# Patient Record
Sex: Male | Born: 1955 | Race: White | Hispanic: No | Marital: Married | Smoking: Never smoker
Health system: Southern US, Community
[De-identification: ages and names within clinical notes are randomized; demographics above are authoritative.]

## PROBLEM LIST (undated history)

## (undated) DIAGNOSIS — I509 Heart failure, unspecified: Secondary | ICD-10-CM

## (undated) DIAGNOSIS — E119 Type 2 diabetes mellitus without complications: Secondary | ICD-10-CM

## (undated) DIAGNOSIS — I10 Essential (primary) hypertension: Secondary | ICD-10-CM

## (undated) DIAGNOSIS — R0600 Dyspnea, unspecified: Secondary | ICD-10-CM

## (undated) HISTORY — PX: WISDOM TOOTH EXTRACTION: SHX21

---

## 2017-06-05 DIAGNOSIS — J209 Acute bronchitis, unspecified: Secondary | ICD-10-CM | POA: Diagnosis not present

## 2017-09-07 DIAGNOSIS — R05 Cough: Secondary | ICD-10-CM | POA: Diagnosis not present

## 2017-09-07 DIAGNOSIS — J069 Acute upper respiratory infection, unspecified: Secondary | ICD-10-CM | POA: Diagnosis not present

## 2017-09-07 DIAGNOSIS — R062 Wheezing: Secondary | ICD-10-CM | POA: Diagnosis not present

## 2018-07-20 DIAGNOSIS — Z23 Encounter for immunization: Secondary | ICD-10-CM | POA: Diagnosis not present

## 2018-10-20 DIAGNOSIS — Z6828 Body mass index (BMI) 28.0-28.9, adult: Secondary | ICD-10-CM | POA: Diagnosis not present

## 2018-10-20 DIAGNOSIS — Z13220 Encounter for screening for lipoid disorders: Secondary | ICD-10-CM | POA: Diagnosis not present

## 2018-10-20 DIAGNOSIS — Z125 Encounter for screening for malignant neoplasm of prostate: Secondary | ICD-10-CM | POA: Diagnosis not present

## 2018-10-20 DIAGNOSIS — Z1211 Encounter for screening for malignant neoplasm of colon: Secondary | ICD-10-CM | POA: Diagnosis not present

## 2018-10-20 DIAGNOSIS — Z1331 Encounter for screening for depression: Secondary | ICD-10-CM | POA: Diagnosis not present

## 2018-10-20 DIAGNOSIS — Z0001 Encounter for general adult medical examination with abnormal findings: Secondary | ICD-10-CM | POA: Diagnosis not present

## 2018-10-20 DIAGNOSIS — Z131 Encounter for screening for diabetes mellitus: Secondary | ICD-10-CM | POA: Diagnosis not present

## 2018-10-20 DIAGNOSIS — I10 Essential (primary) hypertension: Secondary | ICD-10-CM | POA: Diagnosis not present

## 2018-11-14 DIAGNOSIS — E7800 Pure hypercholesterolemia, unspecified: Secondary | ICD-10-CM | POA: Diagnosis not present

## 2018-11-14 DIAGNOSIS — I10 Essential (primary) hypertension: Secondary | ICD-10-CM | POA: Diagnosis not present

## 2018-11-14 DIAGNOSIS — Z6829 Body mass index (BMI) 29.0-29.9, adult: Secondary | ICD-10-CM | POA: Diagnosis not present

## 2018-11-14 DIAGNOSIS — E1165 Type 2 diabetes mellitus with hyperglycemia: Secondary | ICD-10-CM | POA: Diagnosis not present

## 2019-01-10 DIAGNOSIS — E1165 Type 2 diabetes mellitus with hyperglycemia: Secondary | ICD-10-CM | POA: Diagnosis not present

## 2019-01-10 DIAGNOSIS — E785 Hyperlipidemia, unspecified: Secondary | ICD-10-CM | POA: Diagnosis not present

## 2019-01-15 DIAGNOSIS — J309 Allergic rhinitis, unspecified: Secondary | ICD-10-CM | POA: Diagnosis not present

## 2019-01-15 DIAGNOSIS — I10 Essential (primary) hypertension: Secondary | ICD-10-CM | POA: Diagnosis not present

## 2019-01-15 DIAGNOSIS — E7800 Pure hypercholesterolemia, unspecified: Secondary | ICD-10-CM | POA: Diagnosis not present

## 2019-01-15 DIAGNOSIS — E1165 Type 2 diabetes mellitus with hyperglycemia: Secondary | ICD-10-CM | POA: Diagnosis not present

## 2019-01-19 DIAGNOSIS — E1165 Type 2 diabetes mellitus with hyperglycemia: Secondary | ICD-10-CM | POA: Diagnosis not present

## 2019-02-16 DIAGNOSIS — J0100 Acute maxillary sinusitis, unspecified: Secondary | ICD-10-CM | POA: Diagnosis not present

## 2019-02-16 DIAGNOSIS — J205 Acute bronchitis due to respiratory syncytial virus: Secondary | ICD-10-CM | POA: Diagnosis not present

## 2019-04-17 DIAGNOSIS — E1165 Type 2 diabetes mellitus with hyperglycemia: Secondary | ICD-10-CM | POA: Diagnosis not present

## 2019-04-19 DIAGNOSIS — E785 Hyperlipidemia, unspecified: Secondary | ICD-10-CM | POA: Diagnosis not present

## 2019-04-19 DIAGNOSIS — E1169 Type 2 diabetes mellitus with other specified complication: Secondary | ICD-10-CM | POA: Diagnosis not present

## 2019-04-19 DIAGNOSIS — E7800 Pure hypercholesterolemia, unspecified: Secondary | ICD-10-CM | POA: Diagnosis not present

## 2019-04-19 DIAGNOSIS — I10 Essential (primary) hypertension: Secondary | ICD-10-CM | POA: Diagnosis not present

## 2019-04-24 DIAGNOSIS — U071 COVID-19: Secondary | ICD-10-CM | POA: Diagnosis not present

## 2019-04-25 DIAGNOSIS — U071 COVID-19: Secondary | ICD-10-CM | POA: Diagnosis not present

## 2019-04-27 DIAGNOSIS — U071 COVID-19: Secondary | ICD-10-CM | POA: Diagnosis not present

## 2019-07-05 DIAGNOSIS — Z23 Encounter for immunization: Secondary | ICD-10-CM | POA: Diagnosis not present

## 2019-07-30 DIAGNOSIS — Z6825 Body mass index (BMI) 25.0-25.9, adult: Secondary | ICD-10-CM | POA: Diagnosis not present

## 2019-07-30 DIAGNOSIS — Z20828 Contact with and (suspected) exposure to other viral communicable diseases: Secondary | ICD-10-CM | POA: Diagnosis not present

## 2019-07-30 DIAGNOSIS — J22 Unspecified acute lower respiratory infection: Secondary | ICD-10-CM | POA: Diagnosis not present

## 2019-07-30 DIAGNOSIS — I10 Essential (primary) hypertension: Secondary | ICD-10-CM | POA: Diagnosis not present

## 2019-08-28 DIAGNOSIS — Z6825 Body mass index (BMI) 25.0-25.9, adult: Secondary | ICD-10-CM | POA: Diagnosis not present

## 2019-08-28 DIAGNOSIS — J40 Bronchitis, not specified as acute or chronic: Secondary | ICD-10-CM | POA: Diagnosis not present

## 2019-08-28 DIAGNOSIS — E1169 Type 2 diabetes mellitus with other specified complication: Secondary | ICD-10-CM | POA: Diagnosis not present

## 2019-08-28 DIAGNOSIS — E785 Hyperlipidemia, unspecified: Secondary | ICD-10-CM | POA: Diagnosis not present

## 2019-09-12 ENCOUNTER — Other Ambulatory Visit: Payer: Self-pay | Admitting: Family Medicine

## 2019-09-13 ENCOUNTER — Other Ambulatory Visit: Payer: Self-pay | Admitting: Family Medicine

## 2019-09-13 DIAGNOSIS — N183 Chronic kidney disease, stage 3 unspecified: Secondary | ICD-10-CM

## 2019-09-13 DIAGNOSIS — N289 Disorder of kidney and ureter, unspecified: Secondary | ICD-10-CM

## 2019-09-20 ENCOUNTER — Ambulatory Visit
Admission: RE | Admit: 2019-09-20 | Discharge: 2019-09-20 | Disposition: A | Discharge status: Home / Self Care | Payer: BC Managed Care – PPO | Source: Ambulatory Visit | Attending: Family Medicine | Admitting: Family Medicine

## 2019-09-20 DIAGNOSIS — N183 Chronic kidney disease, stage 3 unspecified: Secondary | ICD-10-CM

## 2019-09-20 DIAGNOSIS — N289 Disorder of kidney and ureter, unspecified: Secondary | ICD-10-CM

## 2019-11-25 DIAGNOSIS — J209 Acute bronchitis, unspecified: Secondary | ICD-10-CM | POA: Diagnosis not present

## 2019-11-25 DIAGNOSIS — J309 Allergic rhinitis, unspecified: Secondary | ICD-10-CM | POA: Diagnosis not present

## 2020-02-05 DIAGNOSIS — N179 Acute kidney failure, unspecified: Secondary | ICD-10-CM

## 2020-02-05 HISTORY — DX: Acute kidney failure, unspecified: N17.9

## 2020-02-20 DIAGNOSIS — J0100 Acute maxillary sinusitis, unspecified: Secondary | ICD-10-CM | POA: Diagnosis not present

## 2020-02-20 DIAGNOSIS — J209 Acute bronchitis, unspecified: Secondary | ICD-10-CM | POA: Diagnosis not present

## 2020-03-03 ENCOUNTER — Encounter (HOSPITAL_COMMUNITY): Payer: Self-pay

## 2020-03-03 ENCOUNTER — Inpatient Hospital Stay (HOSPITAL_COMMUNITY)
Admission: EM | Admit: 2020-03-03 | Discharge: 2020-03-12 | DRG: 683 | Disposition: A | Discharge status: Home / Self Care | Payer: BC Managed Care – PPO | Attending: Internal Medicine | Admitting: Internal Medicine

## 2020-03-03 DIAGNOSIS — Z79899 Other long term (current) drug therapy: Secondary | ICD-10-CM | POA: Diagnosis not present

## 2020-03-03 DIAGNOSIS — E1122 Type 2 diabetes mellitus with diabetic chronic kidney disease: Secondary | ICD-10-CM | POA: Diagnosis present

## 2020-03-03 DIAGNOSIS — T361X5A Adverse effect of cephalosporins and other beta-lactam antibiotics, initial encounter: Secondary | ICD-10-CM | POA: Diagnosis present

## 2020-03-03 DIAGNOSIS — E875 Hyperkalemia: Secondary | ICD-10-CM

## 2020-03-03 DIAGNOSIS — I160 Hypertensive urgency: Secondary | ICD-10-CM

## 2020-03-03 DIAGNOSIS — N184 Chronic kidney disease, stage 4 (severe): Secondary | ICD-10-CM | POA: Diagnosis not present

## 2020-03-03 DIAGNOSIS — D72829 Elevated white blood cell count, unspecified: Secondary | ICD-10-CM | POA: Diagnosis present

## 2020-03-03 DIAGNOSIS — Z20822 Contact with and (suspected) exposure to covid-19: Secondary | ICD-10-CM | POA: Diagnosis present

## 2020-03-03 DIAGNOSIS — N10 Acute tubulo-interstitial nephritis: Secondary | ICD-10-CM | POA: Diagnosis not present

## 2020-03-03 DIAGNOSIS — R768 Other specified abnormal immunological findings in serum: Secondary | ICD-10-CM | POA: Diagnosis not present

## 2020-03-03 DIAGNOSIS — Z7984 Long term (current) use of oral hypoglycemic drugs: Secondary | ICD-10-CM

## 2020-03-03 DIAGNOSIS — D649 Anemia, unspecified: Secondary | ICD-10-CM

## 2020-03-03 DIAGNOSIS — E11649 Type 2 diabetes mellitus with hypoglycemia without coma: Secondary | ICD-10-CM | POA: Diagnosis not present

## 2020-03-03 DIAGNOSIS — E559 Vitamin D deficiency, unspecified: Secondary | ICD-10-CM | POA: Diagnosis not present

## 2020-03-03 DIAGNOSIS — N179 Acute kidney failure, unspecified: Principal | ICD-10-CM | POA: Diagnosis present

## 2020-03-03 DIAGNOSIS — E872 Acidosis, unspecified: Secondary | ICD-10-CM

## 2020-03-03 DIAGNOSIS — D631 Anemia in chronic kidney disease: Secondary | ICD-10-CM | POA: Diagnosis not present

## 2020-03-03 DIAGNOSIS — E1169 Type 2 diabetes mellitus with other specified complication: Secondary | ICD-10-CM | POA: Diagnosis not present

## 2020-03-03 DIAGNOSIS — Z03818 Encounter for observation for suspected exposure to other biological agents ruled out: Secondary | ICD-10-CM | POA: Diagnosis not present

## 2020-03-03 DIAGNOSIS — I10 Essential (primary) hypertension: Secondary | ICD-10-CM | POA: Diagnosis not present

## 2020-03-03 DIAGNOSIS — I129 Hypertensive chronic kidney disease with stage 1 through stage 4 chronic kidney disease, or unspecified chronic kidney disease: Secondary | ICD-10-CM | POA: Diagnosis not present

## 2020-03-03 DIAGNOSIS — D638 Anemia in other chronic diseases classified elsewhere: Secondary | ICD-10-CM | POA: Diagnosis not present

## 2020-03-03 DIAGNOSIS — E785 Hyperlipidemia, unspecified: Secondary | ICD-10-CM | POA: Diagnosis not present

## 2020-03-03 DIAGNOSIS — Q600 Renal agenesis, unilateral: Secondary | ICD-10-CM | POA: Diagnosis not present

## 2020-03-03 DIAGNOSIS — N183 Chronic kidney disease, stage 3 unspecified: Secondary | ICD-10-CM | POA: Diagnosis not present

## 2020-03-03 HISTORY — DX: Essential (primary) hypertension: I10

## 2020-03-03 HISTORY — DX: Type 2 diabetes mellitus without complications: E11.9

## 2020-03-03 LAB — URINALYSIS, ROUTINE W REFLEX MICROSCOPIC
Bacteria, UA: NONE SEEN
Bilirubin Urine: NEGATIVE
Glucose, UA: >=500 mg/dL — AB
Ketones, ur: NEGATIVE mg/dL
Leukocytes,Ua: NEGATIVE
Nitrite: NEGATIVE
Protein, ur: 100 mg/dL — AB
Specific Gravity, Urine: 1.007 (ref 1.005–1.030)
pH: 5 (ref 5.0–8.0)

## 2020-03-03 LAB — CBC
HCT: 34.4 % — ABNORMAL LOW (ref 39.0–52.0)
Hemoglobin: 10.9 g/dL — ABNORMAL LOW (ref 13.0–17.0)
MCH: 31 pg (ref 26.0–34.0)
MCHC: 31.7 g/dL (ref 30.0–36.0)
MCV: 97.7 fL (ref 80.0–100.0)
Platelets: 392 10*3/uL (ref 150–400)
RBC: 3.52 MIL/uL — ABNORMAL LOW (ref 4.22–5.81)
RDW: 14.3 % (ref 11.5–15.5)
WBC: 10.1 10*3/uL (ref 4.0–10.5)
nRBC: 0 % (ref 0.0–0.2)

## 2020-03-03 LAB — BASIC METABOLIC PANEL
Anion gap: 15 (ref 5–15)
BUN: 101 mg/dL — ABNORMAL HIGH (ref 8–23)
CO2: 12 mmol/L — ABNORMAL LOW (ref 22–32)
Calcium: 8.4 mg/dL — ABNORMAL LOW (ref 8.9–10.3)
Chloride: 106 mmol/L (ref 98–111)
Creatinine, Ser: 8.83 mg/dL — ABNORMAL HIGH (ref 0.61–1.24)
GFR calc Af Amer: 7 mL/min — ABNORMAL LOW (ref >60)
GFR calc non Af Amer: 6 mL/min — ABNORMAL LOW (ref >60)
Glucose, Bld: 105 mg/dL — ABNORMAL HIGH (ref 70–99)
Potassium: 5.9 mmol/L — ABNORMAL HIGH (ref 3.5–5.1)
Sodium: 133 mmol/L — ABNORMAL LOW (ref 135–145)

## 2020-03-03 NOTE — ED Triage Notes (Signed)
Pt went to his PCP for routine check and told that his kidney labs were abnormal and he was referred here. Pt denies urinary symptoms.

## 2020-03-03 NOTE — ED Notes (Signed)
Called pt x2 for triage, no response.

## 2020-03-04 ENCOUNTER — Encounter (HOSPITAL_COMMUNITY): Payer: Self-pay | Admitting: Internal Medicine

## 2020-03-04 ENCOUNTER — Other Ambulatory Visit: Payer: Self-pay

## 2020-03-04 ENCOUNTER — Inpatient Hospital Stay (HOSPITAL_COMMUNITY): Payer: BC Managed Care – PPO

## 2020-03-04 DIAGNOSIS — E872 Acidosis, unspecified: Secondary | ICD-10-CM

## 2020-03-04 DIAGNOSIS — E1122 Type 2 diabetes mellitus with diabetic chronic kidney disease: Secondary | ICD-10-CM | POA: Diagnosis present

## 2020-03-04 DIAGNOSIS — Z20822 Contact with and (suspected) exposure to covid-19: Secondary | ICD-10-CM | POA: Diagnosis present

## 2020-03-04 DIAGNOSIS — Z79899 Other long term (current) drug therapy: Secondary | ICD-10-CM | POA: Diagnosis not present

## 2020-03-04 DIAGNOSIS — I160 Hypertensive urgency: Secondary | ICD-10-CM

## 2020-03-04 DIAGNOSIS — R768 Other specified abnormal immunological findings in serum: Secondary | ICD-10-CM | POA: Diagnosis present

## 2020-03-04 DIAGNOSIS — N10 Acute tubulo-interstitial nephritis: Secondary | ICD-10-CM | POA: Diagnosis present

## 2020-03-04 DIAGNOSIS — N179 Acute kidney failure, unspecified: Secondary | ICD-10-CM | POA: Diagnosis present

## 2020-03-04 DIAGNOSIS — E875 Hyperkalemia: Secondary | ICD-10-CM | POA: Diagnosis present

## 2020-03-04 DIAGNOSIS — Z7984 Long term (current) use of oral hypoglycemic drugs: Secondary | ICD-10-CM | POA: Diagnosis not present

## 2020-03-04 DIAGNOSIS — D72829 Elevated white blood cell count, unspecified: Secondary | ICD-10-CM | POA: Diagnosis present

## 2020-03-04 DIAGNOSIS — N184 Chronic kidney disease, stage 4 (severe): Secondary | ICD-10-CM | POA: Diagnosis present

## 2020-03-04 DIAGNOSIS — D631 Anemia in chronic kidney disease: Secondary | ICD-10-CM | POA: Diagnosis present

## 2020-03-04 DIAGNOSIS — E11649 Type 2 diabetes mellitus with hypoglycemia without coma: Secondary | ICD-10-CM | POA: Diagnosis not present

## 2020-03-04 DIAGNOSIS — D649 Anemia, unspecified: Secondary | ICD-10-CM

## 2020-03-04 DIAGNOSIS — T361X5A Adverse effect of cephalosporins and other beta-lactam antibiotics, initial encounter: Secondary | ICD-10-CM | POA: Diagnosis present

## 2020-03-04 DIAGNOSIS — I129 Hypertensive chronic kidney disease with stage 1 through stage 4 chronic kidney disease, or unspecified chronic kidney disease: Secondary | ICD-10-CM | POA: Diagnosis present

## 2020-03-04 LAB — BASIC METABOLIC PANEL
Anion gap: 13 (ref 5–15)
Anion gap: 13 (ref 5–15)
BUN: 99 mg/dL — ABNORMAL HIGH (ref 8–23)
BUN: 98 mg/dL — ABNORMAL HIGH (ref 8–23)
CO2: 11 mmol/L — ABNORMAL LOW (ref 22–32)
CO2: 15 mmol/L — ABNORMAL LOW (ref 22–32)
Calcium: 7.7 mg/dL — ABNORMAL LOW (ref 8.9–10.3)
Calcium: 8 mg/dL — ABNORMAL LOW (ref 8.9–10.3)
Chloride: 109 mmol/L (ref 98–111)
Chloride: 108 mmol/L (ref 98–111)
Creatinine, Ser: 8.32 mg/dL — ABNORMAL HIGH (ref 0.61–1.24)
Creatinine, Ser: 8.65 mg/dL — ABNORMAL HIGH (ref 0.61–1.24)
GFR calc Af Amer: 7 mL/min — ABNORMAL LOW (ref >60)
GFR calc Af Amer: 7 mL/min — ABNORMAL LOW (ref >60)
GFR calc non Af Amer: 6 mL/min — ABNORMAL LOW (ref >60)
GFR calc non Af Amer: 6 mL/min — ABNORMAL LOW (ref >60)
Glucose, Bld: 73 mg/dL (ref 70–99)
Glucose, Bld: 69 mg/dL — ABNORMAL LOW (ref 70–99)
Potassium: 5.9 mmol/L — ABNORMAL HIGH (ref 3.5–5.1)
Potassium: 5.2 mmol/L — ABNORMAL HIGH (ref 3.5–5.1)
Sodium: 133 mmol/L — ABNORMAL LOW (ref 135–145)
Sodium: 136 mmol/L (ref 135–145)

## 2020-03-04 LAB — RETICULOCYTES
Immature Retic Fract: 8.2 % (ref 2.3–15.9)
RBC.: 3.13 MIL/uL — ABNORMAL LOW (ref 4.22–5.81)
Retic Count, Absolute: 25.4 10*3/uL (ref 19.0–186.0)
Retic Ct Pct: 0.8 % (ref 0.4–3.1)

## 2020-03-04 LAB — IRON AND TIBC
Iron: 145 ug/dL (ref 45–182)
Saturation Ratios: 67 % — ABNORMAL HIGH (ref 17.9–39.5)
TIBC: 216 ug/dL — ABNORMAL LOW (ref 250–450)
UIBC: 71 ug/dL

## 2020-03-04 LAB — PROTEIN / CREATININE RATIO, URINE
Creatinine, Urine: 40.79 mg/dL
Protein Creatinine Ratio: 2.11 mg/mg{Cre} — ABNORMAL HIGH (ref 0.00–0.15)
Total Protein, Urine: 86 mg/dL

## 2020-03-04 LAB — FOLATE: Folate: 10 ng/mL (ref >5.9)

## 2020-03-04 LAB — SODIUM, URINE, RANDOM: Sodium, Ur: 45 mmol/L

## 2020-03-04 LAB — PROTIME-INR
INR: 1 (ref 0.8–1.2)
Prothrombin Time: 12.9 seconds (ref 11.4–15.2)

## 2020-03-04 LAB — CBG MONITORING, ED
Glucose-Capillary: 66 mg/dL — ABNORMAL LOW (ref 70–99)
Glucose-Capillary: 96 mg/dL (ref 70–99)

## 2020-03-04 LAB — VITAMIN B12: Vitamin B-12: 278 pg/mL (ref 180–914)

## 2020-03-04 LAB — HEPATIC FUNCTION PANEL
ALT: 18 U/L (ref 0–44)
AST: 18 U/L (ref 15–41)
Albumin: 3.3 g/dL — ABNORMAL LOW (ref 3.5–5.0)
Alkaline Phosphatase: 60 U/L (ref 38–126)
Bilirubin, Direct: 0.1 mg/dL (ref 0.0–0.2)
Indirect Bilirubin: 0.6 mg/dL (ref 0.3–0.9)
Total Bilirubin: 0.7 mg/dL (ref 0.3–1.2)
Total Protein: 6.2 g/dL — ABNORMAL LOW (ref 6.5–8.1)

## 2020-03-04 LAB — CREATININE, URINE, RANDOM: Creatinine, Urine: 41.63 mg/dL

## 2020-03-04 LAB — HEMOGLOBIN A1C
Hgb A1c MFr Bld: 6.8 % — ABNORMAL HIGH (ref 4.8–5.6)
Mean Plasma Glucose: 148.46 mg/dL

## 2020-03-04 LAB — GLUCOSE, CAPILLARY
Glucose-Capillary: 104 mg/dL — ABNORMAL HIGH (ref 70–99)
Glucose-Capillary: 89 mg/dL (ref 70–99)
Glucose-Capillary: 96 mg/dL (ref 70–99)

## 2020-03-04 LAB — HIV ANTIBODY (ROUTINE TESTING W REFLEX): HIV Screen 4th Generation wRfx: NONREACTIVE

## 2020-03-04 LAB — FERRITIN: Ferritin: 348 ng/mL — ABNORMAL HIGH (ref 24–336)

## 2020-03-04 LAB — SARS CORONAVIRUS 2 BY RT PCR (HOSPITAL ORDER, PERFORMED IN ~~LOC~~ HOSPITAL LAB): SARS Coronavirus 2: NEGATIVE

## 2020-03-04 MED ORDER — INSULIN ASPART 100 UNIT/ML ~~LOC~~ SOLN: 0.0000 [IU] | Freq: Every day | SUBCUTANEOUS | Status: DC

## 2020-03-04 MED ORDER — SODIUM ZIRCONIUM CYCLOSILICATE 10 G PO PACK
10.0000 g | PACK | Freq: Once | ORAL | Status: AC
  Administered 2020-03-04: 10 g via ORAL
  Filled 2020-03-04: qty 1

## 2020-03-04 MED ORDER — INSULIN ASPART 100 UNIT/ML ~~LOC~~ SOLN: 0.0000 [IU] | Freq: Three times a day (TID) | SUBCUTANEOUS | Status: DC

## 2020-03-04 MED ORDER — FLUTICASONE PROPIONATE 50 MCG/ACT NA SUSP
1.0000 | Freq: Every day | NASAL | Status: DC | PRN
  Filled 2020-03-04: qty 16

## 2020-03-04 MED ORDER — ALBUTEROL SULFATE (2.5 MG/3ML) 0.083% IN NEBU: 3.0000 mL | INHALATION_SOLUTION | Freq: Four times a day (QID) | RESPIRATORY_TRACT | Status: DC | PRN

## 2020-03-04 MED ORDER — STERILE WATER FOR INJECTION IV SOLN
Freq: Once | INTRAVENOUS | Status: AC
  Filled 2020-03-04: qty 850

## 2020-03-04 MED ORDER — ACETAMINOPHEN 325 MG PO TABS: 650.0000 mg | ORAL_TABLET | Freq: Four times a day (QID) | ORAL | Status: DC | PRN

## 2020-03-04 MED ORDER — CALCIUM GLUCONATE-NACL 1-0.675 GM/50ML-% IV SOLN
1.0000 g | Freq: Once | INTRAVENOUS | Status: AC
  Administered 2020-03-04: 1000 mg via INTRAVENOUS
  Filled 2020-03-04: qty 50

## 2020-03-04 MED ORDER — STERILE WATER FOR INJECTION IV SOLN
INTRAVENOUS | Status: DC
  Filled 2020-03-04 (×3): qty 850

## 2020-03-04 MED ORDER — HYDRALAZINE HCL 20 MG/ML IJ SOLN: 10.0000 mg | INTRAMUSCULAR | Status: DC | PRN

## 2020-03-04 MED ORDER — HYDRALAZINE HCL 25 MG PO TABS: 25.0000 mg | ORAL_TABLET | Freq: Three times a day (TID) | ORAL | Status: DC

## 2020-03-04 MED ORDER — AMLODIPINE BESYLATE 5 MG PO TABS
5.0000 mg | ORAL_TABLET | Freq: Every day | ORAL | Status: DC
  Administered 2020-03-04 – 2020-03-12 (×9): 5 mg via ORAL
  Filled 2020-03-04 (×9): qty 1

## 2020-03-04 MED ORDER — SODIUM CHLORIDE 0.9 % IV BOLUS
1000.0000 mL | Freq: Once | INTRAVENOUS | Status: AC
  Administered 2020-03-04: 1000 mL via INTRAVENOUS

## 2020-03-04 MED ORDER — HEPARIN SODIUM (PORCINE) 5000 UNIT/ML IJ SOLN: 5000.0000 [IU] | Freq: Three times a day (TID) | INTRAMUSCULAR | Status: DC

## 2020-03-04 MED ORDER — HYDRALAZINE HCL 25 MG PO TABS
25.0000 mg | ORAL_TABLET | Freq: Three times a day (TID) | ORAL | Status: DC
  Administered 2020-03-04 – 2020-03-08 (×12): 25 mg via ORAL
  Filled 2020-03-04 (×12): qty 1

## 2020-03-04 MED ORDER — ACETAMINOPHEN 650 MG RE SUPP: 650.0000 mg | Freq: Four times a day (QID) | RECTAL | Status: DC | PRN

## 2020-03-04 NOTE — Progress Notes (Signed)
Pt arrived to unit from ED via stretcher accompanied by staff. Pt alert/oriented in no apparent distress.  Ambulatory and situated/orientated to room/equiipments. Pt's welcome guide/menu provided with instructions. Pt verbalized understanding of instructions. No complaints voiced. Pt made aware that facility is not responsible for any losses/damages to personal belongings/valuables. And it would be better to be kept at home,or hand it to security for safe keeping. 3 side rails up and call bell/room phone within reach and all wheels locked.

## 2020-03-04 NOTE — Progress Notes (Signed)
This is a very pleasant 64 year old gentleman who was admitted early morning secondary to acute renal failure.  His previous renal status/creatinine/GFR is unknown.  He is from [place] and goes to [facility] family medicine.  Nephrology has seen this patient and has requested to obtain records to compare his previous renal function.  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  Further work-up per them.  Holding all nephrotoxic agents at this point in time.  Potassium improved from 5.9-5.2 after receiving Lokelma in the emergency department.  Remains on bicarb drip due to metabolic acidosis.  Blood pressure slightly elevated.  Continue on amlodipine.  Hydralazine p.o. added by nephrology.  Holding lisinopril and hydrochlorothiazide.

## 2020-03-04 NOTE — ED Provider Notes (Signed)
[facility] EMERGENCY DEPARTMENT Provider Note   CSN: [id number] Arrival date & time: 03/03/20  1810     History Chief Complaint  Patient presents with  . Abnormal Lab    Benjamin [name] is a 64 y.o. male.  HPI     This is a 64 year old male who presents with abnormal lab work.  History of diabetes and hypertension.  States that he saw his primary physician yesterday as he has been having increasing fatigue and has noted increasing bruising and bleeding with minor trauma.  Patient had lab work done and was told by his primary physician that his kidneys were damaged.  He was referred here.  Patient reports that he in general has had increasing fatigue and malaise over the last month or so.  He has also noted that he has bruised and blood easily from the bilateral arms with minor trauma.  He reports that he had some congestion and was treated with a steroid Dosepak and Augmentin several weeks ago.  Other than that he has not had any new antibiotics.  He does not take any NSAIDs.  He does take lisinopril for high blood pressure but states that he does not believe that this is helping him.  He has not had any fever, chest pain, shortness of breath, abdominal pain.  He reports that he previously was a heavy drinker but quit 2 years ago and has not had anything to drink.  However, he used to drink daily.  He reports that he has never had any issues with his kidney function that he knows of.  He has not been vaccinated against COVID-19.  No known exposures  Past Medical History:  Diagnosis Date  . Diabetes mellitus without complication ([place])   . Hypertension     There are no problems to display for this patient.   History reviewed. No pertinent surgical history.     No family history on file.  Social History   Tobacco Use  . Smoking status: Never Smoker  . Smokeless tobacco: Never Used  Substance Use Topics  . Alcohol use: Never  . Drug use: Never    Home  Medications Prior to Admission medications   Medication Sig Start Date End Date Taking? Authorizing Provider  albuterol (VENTOLIN HFA) 108 (90 Base) MCG/ACT inhaler Inhale 1-2 puffs into the lungs every 6 (six) hours as needed for shortness of breath or wheezing. 02/20/20  Yes [provider]  amoxicillin-clavulanate (AUGMENTIN) 875-125 MG tablet Take 1 tablet by mouth 2 (two) times daily. For 10 days. 02/20/20  Yes [provider]  FARXIGA 5 MG TABS tablet Take 5 mg by mouth daily. 02/06/20  Yes [provider]  fluticasone (FLONASE) 50 MCG/ACT nasal spray Place 1-2 sprays into both nostrils daily as needed for allergies or rhinitis.   Yes [provider]  lisinopril-hydrochlorothiazide (ZESTORETIC) 10-12.5 MG tablet Take 1 tablet by mouth daily. 02/27/20  Yes [provider]  amLODipine (NORVASC) 5 MG tablet Take 5 mg by mouth daily. 03/03/20   [provider]  methylPREDNISolone (MEDROL) 4 MG tablet Take 4 mg by mouth as directed. Patient not taking: Reported on 03/04/2020 02/20/20   [provider]    Allergies    Patient has no known allergies.  Review of Systems   Review of Systems  Constitutional: Positive for fatigue. Negative for fever.  Respiratory: Negative for cough and shortness of breath.   Cardiovascular: Negative for chest pain.  Gastrointestinal: Negative for  abdominal pain, diarrhea, nausea and vomiting.  Genitourinary: Negative for difficulty urinating and dysuria.  Neurological: Negative for dizziness.  All other systems reviewed and are negative.   Physical Exam Updated Vital Signs BP (!) 194/104   Pulse 94   Temp 98.5 F (36.9 C) (Oral)   Resp 16   SpO2 99%   Physical Exam Vitals and nursing note reviewed.  Constitutional:      Appearance: He is well-developed. He is not ill-appearing.  HENT:     Head: Normocephalic and atraumatic.     Right Ear: Tympanic membrane normal.     Left Ear: Tympanic  membrane normal.     Mouth/Throat:     Mouth: Mucous membranes are moist.  Eyes:     Pupils: Pupils are equal, round, and reactive to light.  Cardiovascular:     Rate and Rhythm: Normal rate and regular rhythm.     Heart sounds: Normal heart sounds. No murmur heard.   Pulmonary:     Effort: Pulmonary effort is normal. No respiratory distress.     Breath sounds: Normal breath sounds. No wheezing.  Abdominal:     General: Bowel sounds are normal.     Palpations: Abdomen is soft.     Tenderness: There is no abdominal tenderness. There is no rebound.  Musculoskeletal:        General: No deformity.     Cervical back: Neck supple.  Lymphadenopathy:     Cervical: No cervical adenopathy.  Skin:    General: Skin is warm and dry.     Comments: Bruising and multiple wounds noted to the bilateral upper extremities  Neurological:     Mental Status: He is alert and oriented to person, place, and time.  Psychiatric:        Mood and Affect: Mood normal.     ED Results / Procedures / Treatments   Labs (all labs ordered are listed, but only abnormal results are displayed) Labs Reviewed  CBC - Abnormal; Notable for the following components:      Result Value   RBC 3.52 (*)    Hemoglobin 10.9 (*)    HCT 34.4 (*)    All other components within normal limits  BASIC METABOLIC PANEL - Abnormal; Notable for the following components:   Sodium 133 (*)    Potassium 5.9 (*)    CO2 12 (*)    Glucose, Bld 105 (*)    BUN 101 (*)    Creatinine, Ser 8.83 (*)    Calcium 8.4 (*)    GFR calc non Af Amer 6 (*)    GFR calc Af Amer 7 (*)    All other components within normal limits  URINALYSIS, ROUTINE W REFLEX MICROSCOPIC - Abnormal; Notable for the following components:   APPearance HAZY (*)    Glucose, UA >=500 (*)    Hgb urine dipstick MODERATE (*)    Protein, ur 100 (*)    All other components within normal limits  SARS CORONAVIRUS 2 BY RT PCR (HOSPITAL ORDER, [facility]  [facility])  PROTIME-INR  HEPATIC FUNCTION PANEL    EKG EKG Interpretation  Date/Time:  Tuesday March 04 2020 01:39:41 EDT Ventricular Rate:  92 PR Interval:    QRS Duration: 93 QT Interval:  351 QTC Calculation: 435 R Axis:   35 Text Interpretation: Sinus rhythm Probable anteroseptal infarct, old Borderline ST depression, anterolateral leads No prior for comparison Confirmed by Thayer Jew [id number]) on 03/04/2020 2:07:56 AM  Radiology No results found.  Procedures Procedures (including critical care time)  CRITICAL CARE Performed by: Merryl Hacker   Total critical care time: 40 minutes  Critical care time was exclusive of separately billable procedures and treating other patients.  Critical care was necessary to treat or prevent imminent or life-threatening deterioration.  Critical care was time spent personally by me on the following activities: development of treatment plan with patient and/or surrogate as well as nursing, discussions with consultants, evaluation of patient's response to treatment, examination of patient, obtaining history from patient or surrogate, ordering and performing treatments and interventions, ordering and review of laboratory studies, ordering and review of radiographic studies, pulse oximetry and re-evaluation of patient's condition.   Medications Ordered in ED Medications  sodium bicarbonate 150 mEq in sterile water 1,000 mL infusion (has no administration in time range)  sodium chloride 0.9 % bolus 1,000 mL (1,000 mLs Intravenous Bolus from Bag 03/04/20 0219)  sodium zirconium cyclosilicate (LOKELMA) packet 10 g (10 g Oral Given 03/04/20 1324)    ED Course  I have reviewed the triage vital signs and the nursing notes.  Pertinent labs & imaging results that were available during my care of the patient were reviewed by me and considered in my medical decision making (see chart for details).  Clinical Course as of Mar 04 229  Tue Mar 04, 2020  0218 Spoke with Dr. Johnney Ou, nephrology.  They will evaluate patient first thing in the morning.  Recommends fluids, bicarb drip, and Lokelma.   [CH]    Clinical Course User Index [CH] Kamryn Gauthier, Barbette Hair, MD   MDM Rules/Calculators/A&P                          Patient presents with abnormal labs and worsening renal function.  No history of abnormal renal function in the past for the patient.  Reports generalized fatigue and easy bruising.  He is overall nontoxic.  Vital signs notable for blood pressure of 194/104.  Lab work reviewed from triage.  Creatinine 8.8.  Potassium 5.9.  Bicarb 12 with a BUN of 101.  He is not anuric.  Reports good urine output.  No obvious culprit for kidney failure although he is on lisinopril.  Patient was given fluids.  EKG reviewed.  No peak T waves or acute changes related to hyperkalemia.  Will discuss with nephrology prior to treatment.  See discussion above.  Recommendations for fluids, bicarb drip, and Lokelma.  I also ordered a renal ultrasound as well as hepatic function testing and PT/INR given easy bruising.  We will plan for admission to the hospitalist.    Final Clinical Impression(s) / ED Diagnoses Final diagnoses:  Acute renal failure, unspecified acute renal failure type ([place])  Hyperkalemia    Rx / DC Orders ED Discharge Orders    None       Dina Rich, Barbette Hair, MD 03/04/20 [phone]

## 2020-03-04 NOTE — ED Notes (Signed)
Sons telephone number [phone] Quita Skye

## 2020-03-04 NOTE — ED Notes (Signed)
RN called and spoke to wife.  She is bringing him back to the ED.  They are 30 minutes away.

## 2020-03-04 NOTE — Consult Note (Signed)
[facility] Renal Consultation Note  Requesting MD: Darliss Cheney MD Indication for Consultation:  AKI   Chief complaint: abnormal labs and easy bruising  HPI:  Benjamin [name] is a 64 y.o. male with a history of diabetes and hypertension who had presented to an outpatient clinic for evaluation of easy bruising.  He was evaluated and had labs drawn and then was sent to the ER for reported creatinine of 9.  He states that he feels well and until he understood the serious nature of the AKI he considered not coming to the ER; after talking with his provider he agreed.  He states he's been told of abnormal kidney function for a couple of years.  Denies n/v, chest pain, or shortness of breath or diarrhea.  Eating well.  His only complaint was when working outside he found his arms would bruise more easily.  Home meds include lisinopril-HCTZ (for a couple of years) and he had been on augmentin for a URI (only had 2-3 pills left of his rx). Denies NSAID use; states was told a couple of years ago not to take advil due to kidneys.  Denies family hx of ESRD.  States that his younger brother had a stroke and this made him get serious about his health a few years ago.  Has had HTN for 20 years and DM for a couple - he's not sure and may have had before detected.  He sees Dr. Humphrey Rolls at [facility] in [place].  Cr was 8.83 on presentation here and BUN 101.  Here he was given lokelma for K 5.9 and was given 1 liter of normal saline and then placed on a bicarb gtt.  Note that there is a renal ultrasound that was obtained in January 2021 which was suggestive of medical renal disease without hydro or worrisome lesions; did have simple cyst.  Repeat renal ultrasound today had no hydro and again demonstrated increased echogenicity c/w medical renal disease.  Covid screen negative.  Chart was marked for merge but his other chart did not have lab data that I was able to locate.  He would want to  avoid dialysis if possible but would do if indicated.  He's glad we're trying adjusting meds first.    Creatinine, Ser  Date/Time Value Ref Range Status  03/04/2020 07:54 AM 8.65 (H) 0.61 - 1.24 mg/dL Final  03/04/2020 03:25 AM 8.32 (H) 0.61 - 1.24 mg/dL Final  03/03/2020 07:27 PM 8.83 (H) 0.61 - 1.24 mg/dL Final    PMHx:   Past Medical History:  Diagnosis Date  . Diabetes mellitus without complication ([place])   . Hypertension   CKD unknown stage  Family Hx: brother had CVA.  Denies family history of ESRD  Social History:  reports that he has never smoked. He has never used smokeless tobacco. He reports that he does not drink alcohol and does not use drugs.  Allergies: No Known Allergies  Medications: Prior to Admission medications   Medication Sig Start Date End Date Taking? Authorizing Provider  albuterol (VENTOLIN HFA) 108 (90 Base) MCG/ACT inhaler Inhale 1-2 puffs into the lungs every 6 (six) hours as needed for shortness of breath or wheezing. 02/20/20  Yes [provider]  amoxicillin-clavulanate (AUGMENTIN) 875-125 MG tablet Take 1 tablet by mouth 2 (two) times daily. For 10 days. 02/20/20  Yes [provider]  FARXIGA 5 MG TABS tablet Take 5 mg by mouth daily. 02/06/20  Yes [provider]  fluticasone (  FLONASE) 50 MCG/ACT nasal spray Place 1-2 sprays into both nostrils daily as needed for allergies or rhinitis.   Yes [provider]  lisinopril-hydrochlorothiazide (ZESTORETIC) 10-12.5 MG tablet Take 1 tablet by mouth daily. 02/27/20  Yes [provider]  amLODipine (NORVASC) 5 MG tablet Take 5 mg by mouth daily. 03/03/20   [provider]  methylPREDNISolone (MEDROL) 4 MG tablet Take 4 mg by mouth as directed. Patient not taking: Reported on 03/04/2020 02/20/20   [provider]    I have reviewed the patient's current and reported prior to admission medications.  Labs:  BMP Latest Ref Rng & Units 03/04/2020 03/04/2020  03/03/2020  Glucose 70 - 99 mg/dL 69(L) 73 105(H)  BUN 8 - 23 mg/dL 98(H) 99(H) 101(H)  Creatinine 0.61 - 1.24 mg/dL 8.65(H) 8.32(H) 8.83(H)  Sodium 135 - 145 mmol/L 136 133(L) 133(L)  Potassium 3.5 - 5.1 mmol/L 5.2(H) 5.9(H) 5.9(H)  Chloride 98 - 111 mmol/L 108 109 106  CO2 22 - 32 mmol/L 15(L) 11(L) 12(L)  Calcium 8.9 - 10.3 mg/dL 8.0(L) 7.7(L) 8.4(L)    Urinalysis    Component Value Date/Time   COLORURINE YELLOW 03/03/2020 2028   APPEARANCEUR HAZY (A) 03/03/2020 2028   LABSPEC 1.007 03/03/2020 2028   PHURINE 5.0 03/03/2020 2028   GLUCOSEU >=500 (A) 03/03/2020 2028   HGBUR MODERATE (A) 03/03/2020 2028   BILIRUBINUR NEGATIVE 03/03/2020 2028   KETONESUR NEGATIVE 03/03/2020 2028   PROTEINUR 100 (A) 03/03/2020 2028   NITRITE NEGATIVE 03/03/2020 2028   LEUKOCYTESUR NEGATIVE 03/03/2020 2028     ROS:  Pertinent items noted in HPI and remainder of comprehensive ROS otherwise negative.  Physical Exam: Vitals:   03/04/20 0902 03/04/20 0905  BP: (!) 166/87 (!) 182/96  Pulse: 91 88  Resp: 16 18  Temp:  98.1 F (36.7 C)  SpO2: 99%      General: adult male in bed in NAD at rest HEENT:NCAT Eyes: EOMI sclera anicteric Neck: supple trachea midline Heart: S1S2 no rub Lungs: infrequent wheeze; unlabored on room air Abdomen: soft/NT/ND  Extremities: no edema appreciated  Skin: upper extremities with ecchymoses noted Neuro: alert and oriented x 3 provides hx and follows commands Psych normal mood and affect  Assessment/Plan:  # AKI  - pre-renal insults with lisinopril-HCTZ and also with abx exposure with possible resultant AIN.  UA with glucosuria and proteinuria and 0-5 RBC.  No hydro on updated renal ultrasound - check up/cr ratio  - Strict ins/outs - Continue bicarb gtt - Check post void residual bladder scan and place foley if over 250 mL retained  - Obtain prior records - Dr. Humphrey Rolls at [facility] in [place].  Requested labs x 2 years and last 2 office  notes - Hold lisinopril-HCTZ  - Check phos in AM - Please avoid nephrotoxins and hold nonessential meds.  Hold augmentin  # CKD - unknown stage - patient with some degree of CKD based on his self-report of abnormal creatinine.  Hx of HTN and DM predisposing and note increased echogenicity on ultrasound  # Hyperkalemia - Hold lisinopril - Improved with temporizing measures  - changed to renal DM diet    # Metabolic acidosis  - on bicarb gtt - calcium once now    # HTN  - Holding lisinopril-HCTZ  - Continue amlodipine  - Start hydralazine 25 mg TID   # Normocytic anemia  - No acute indication for ESA   # Reported easy bruising  - Platelets thankfully normal  -  per primary team - note farxiga listed as a home med  Claudia Desanctis 03/04/2020, 10:37 AM

## 2020-03-04 NOTE — Progress Notes (Addendum)
Inpatient Diabetes Program Recommendations  AACE/ADA: New Consensus Statement on Inpatient Glycemic Control (2015)  Target Ranges:  Prepandial:   less than 140 mg/dL      Peak postprandial:   less than 180 mg/dL (1-2 hours)      Critically ill patients:  140 - 180 mg/dL   Lab Results  Component Value Date   GLUCAP 96 03/04/2020   HGBA1C 6.8 (H) 03/04/2020    Review of Glycemic Control  Diabetes history: DM 2 Outpatient Diabetes medications: Farxiga 5 mg daily Current orders for Inpatient glycemic control:  Novolog 0-9 units tid + hs scale  A1c 6.8% on 6/29  Inpatient Diabetes Program Recommendations:    -  Noted pt on Farxiga at home, an SGLT 2 inhibitor. The FDA has issued alerts to AKI in these medications.   -  Recommend not restarting this medication at time of d/c and have pt follow up with prescriber.  Pt may benefit from [facility] outpatient as it is fecally cleared.  -  May need to reduce Novolog correction scale to "very sensitive" starting at 150 mg/dl.  Will monitor glucose trends inpatient.  Thanks, Tama Headings RN, MSN, BC-ADM Inpatient Diabetes Coordinator Team Pager [phone] (8a-5p)

## 2020-03-04 NOTE — ED Notes (Signed)
Patient's EQAS 34 1962. MD made aware.

## 2020-03-04 NOTE — H&P (Signed)
History and Physical    Benjamin [name]:[id number] DOB: 07-07-1956 DOA: 03/03/2020  PCP: [facility] Patient coming from: Home  Chief Complaint: Abnormal labs  HPI: Benjamin [name] is a 64 y.o. male with medical history significant of hypertension, non-insulin-dependent type 2 diabetes being referred to the ED by his PCP after he was noted to have worsening renal function on labs.  Patient states he has kidney problems for which he is seen by his primary care physician every few months.  He had a renal ultrasound done sometime over the past year.  Unclear whether he was referred to nephrology.  States for the past few months he has had easy bruising and bleeding of his skin.  Just bumping into every day objects can cause his skin to break and start bleeding.  Denies any bleeding of his gums or history of bleeding disorder.  States he went to see his PCP yesterday and blood work revealed abnormal kidney function.  As such, he was referred to the ED for further evaluation.  Denies over-the-counter NSAID use.  He is currently finishing a 10-day course of Augmentin which was prescribed to him for "chest congestion."  Denies being diagnosed with pneumonia.  States his breathing has now improved and he is not coughing.  He takes lisinopril-hydrochlorothiazide for high blood pressure.  He missed taking his blood pressure medication yesterday.  Reports having good urine output.  Reports having a poor appetite.  Denies nausea, vomiting, or diarrhea.  ED Course: Blood pressure significantly elevated with systolic in the 628B.  Remainder of vital signs stable.  Hemoglobin 10.9, MCV 97. Platelet count normal.  Potassium 5.9, bicarb 12, anion gap 15, blood glucose 105.  BUN 101, creatinine 8.8.  No prior labs for comparison.  UA with evidence of hyaline casts and crystalluria.  Renal ultrasound pending.  PT 12.9/INR 1.0.  Hepatic function panel pending.  SARS-CoV-2 PCR test pending.  EKG without  acute changes related to hyperkalemia.  ED provider discussed the case with Dr. Johnney Ou who recommended IV fluid, bicarb drip, and Lokelma.  Nephrology will consult in a.m.  Review of Systems:  All systems reviewed and apart from history of presenting illness, are negative.  Past Medical History:  Diagnosis Date  . Diabetes mellitus without complication ([place])   . Hypertension     History reviewed. No pertinent surgical history.   reports that he has never smoked. He has never used smokeless tobacco. He reports that he does not drink alcohol and does not use drugs.  No Known Allergies  History reviewed. No pertinent family history.  Prior to Admission medications   Medication Sig Start Date End Date Taking? Authorizing Provider  albuterol (VENTOLIN HFA) 108 (90 Base) MCG/ACT inhaler Inhale 1-2 puffs into the lungs every 6 (six) hours as needed for shortness of breath or wheezing. 02/20/20  Yes [provider]  amoxicillin-clavulanate (AUGMENTIN) 875-125 MG tablet Take 1 tablet by mouth 2 (two) times daily. For 10 days. 02/20/20  Yes [provider]  FARXIGA 5 MG TABS tablet Take 5 mg by mouth daily. 02/06/20  Yes [provider]  fluticasone (FLONASE) 50 MCG/ACT nasal spray Place 1-2 sprays into both nostrils daily as needed for allergies or rhinitis.   Yes [provider]  lisinopril-hydrochlorothiazide (ZESTORETIC) 10-12.5 MG tablet Take 1 tablet by mouth daily. 02/27/20  Yes [provider]  amLODipine (NORVASC) 5 MG tablet Take 5 mg by mouth daily. 03/03/20   [provider]  methylPREDNISolone (MEDROL) 4 MG tablet Take 4 mg by mouth as directed. Patient not taking: Reported on 03/04/2020 02/20/20   [provider]    Physical Exam: Vitals:   03/03/20 1921 03/04/20 0124 03/04/20 0245 03/04/20 0300  BP: (!) 195/96 (!) 194/104 (!) 191/99 (!) 182/97  Pulse: 95 94 91 93  Resp: 19 16 15 15   Temp: 98.5 F (36.9 C)     TempSrc:  Oral     SpO2: 99% 99% 98% 98%    Physical Exam Constitutional:      General: He is not in acute distress. HENT:     Head: Normocephalic.     Mouth/Throat:     Mouth: Mucous membranes are moist.  Eyes:     Extraocular Movements: Extraocular movements intact.  Cardiovascular:     Rate and Rhythm: Normal rate and regular rhythm.     Pulses: Normal pulses.  Pulmonary:     Effort: Pulmonary effort is normal. No respiratory distress.     Breath sounds: Normal breath sounds. No wheezing or rales.  Abdominal:     General: Bowel sounds are normal. There is no distension.     Palpations: Abdomen is soft.     Tenderness: There is no abdominal tenderness. There is no guarding.  Musculoskeletal:        General: No swelling.     Cervical back: Normal range of motion and neck supple.  Skin:    General: Skin is warm and dry.     Findings: Bruising present.     Comments: Petechiae, purpura, and superficial skin lacerations with slight bleeding noted on bilateral upper extremities  Neurological:     Mental Status: He is alert and oriented to person, place, and time.     Labs on Admission: I have personally reviewed following labs and imaging studies  CBC: Recent Labs  Lab 03/03/20 1927  WBC 10.1  HGB 10.9*  HCT 34.4*  MCV 97.7  PLT 607   Basic Metabolic Panel: Recent Labs  Lab 03/03/20 1927  NA 133*  K 5.9*  CL 106  CO2 12*  GLUCOSE 105*  BUN 101*  CREATININE 8.83*  CALCIUM 8.4*   GFR: CrCl cannot be calculated (Unknown ideal weight.). Liver Function Tests: No results for input(s): AST, ALT, ALKPHOS, BILITOT, PROT, ALBUMIN in the last 168 hours. No results for input(s): LIPASE, AMYLASE in the last 168 hours. No results for input(s): AMMONIA in the last 168 hours. Coagulation Profile: Recent Labs  Lab 03/04/20 0209  INR 1.0   Cardiac Enzymes: No results for input(s): CKTOTAL, CKMB, CKMBINDEX, TROPONINI in the last 168 hours. BNP (last 3 results) No results for  input(s): PROBNP in the last 8760 hours. HbA1C: No results for input(s): HGBA1C in the last 72 hours. CBG: No results for input(s): GLUCAP in the last 168 hours. Lipid Profile: No results for input(s): CHOL, HDL, LDLCALC, TRIG, CHOLHDL, LDLDIRECT in the last 72 hours. Thyroid Function Tests: No results for input(s): TSH, T4TOTAL, FREET4, T3FREE, THYROIDAB in the last 72 hours. Anemia Panel: No results for input(s): VITAMINB12, FOLATE, FERRITIN, TIBC, IRON, RETICCTPCT in the last 72 hours. Urine analysis:    Component Value Date/Time   COLORURINE YELLOW 03/03/2020 2028   APPEARANCEUR HAZY (A) 03/03/2020 2028   LABSPEC 1.007 03/03/2020 2028   PHURINE 5.0 03/03/2020 2028   GLUCOSEU >=500 (A) 03/03/2020 2028   HGBUR MODERATE (A) 03/03/2020 2028   BILIRUBINUR NEGATIVE 03/03/2020 2028   KETONESUR NEGATIVE 03/03/2020 2028   PROTEINUR  100 (A) 03/03/2020 2028   NITRITE NEGATIVE 03/03/2020 2028   LEUKOCYTESUR NEGATIVE 03/03/2020 2028    Radiological Exams on Admission: No results found.  EKG: Independently reviewed.  Sinus rhythm.  Assessment/Plan Principal Problem:   Acute renal failure (ARF) (HCC) Active Problems:   Hyperkalemia   Metabolic acidosis   Hypertensive urgency   Anemia   Acute renal failure: Possibly prerenal due to poor oral intake and home lisinopril and hydrochlorothiazide use. ?Amoxicillin induced nephrotoxicity - currently finishing a 10-day course of Augmentin.  BUN 101, creatinine 8.8.  No prior labs for comparison.  UA with evidence of hyaline casts and crystalluria.  Patient is not anuric. -IV fluid hydration.  Monitor renal function and urine output.  Avoid nephrotoxic agents/contrast.  Hold home lisinopril and hydrochlorothiazide.  Hold Augmentin.  Renal ultrasound pending to rule out obstructive uropathy.  Check urine sodium and creatinine.  Nephrology will consult in a.m.  Hyperkalemia: Likely due to AKI and home ACE inhibitor use.  Potassium 5.9 and EKG  without acute changes related to hyperkalemia. -Cardiac monitoring.  Lokelma per nephrology recommendation.  Repeat BMP and continue to monitor closely.  Normal anion gap metabolic acidosis: Likely due to AKI.  Bicarb 12, anion gap 15. -Sodium bicarbonate in sterile water infusion and continue to monitor BMP  Hypertensive urgency: Blood pressure significantly elevated with systolic in the 211H.  -IV hydralazine as needed for SBP >160  Normocytic anemia: Hemoglobin 10.9, MCV 97.  Patient denies any symptoms of GI bleed such as hematemesis, hematochezia, or melena. -Anemia panel  Easy bruising: Possibly due to qualitative platelet dysfunction in the setting of uremia/acute renal failure.  Platelet count is normal.  PT/INR normal. -Continue management of acute renal failure as mentioned above.  Non-insulin-dependent type 2 diabetes -Check A1c.  Sliding scale insulin sensitive ACHS and CBG checks.  DVT prophylaxis: SCDs at this time given easy bruising/bleeding Code Status: Full code Family Communication: No family available at this time. Disposition Plan: Status is: Inpatient  Remains inpatient appropriate because:IV treatments appropriate due to intensity of illness or inability to take PO and Inpatient level of care appropriate due to severity of illness   Dispo: The patient is from: Home              Anticipated d/c is to: Home              Anticipated d/c date is: > 3 days              Patient currently is not medically stable to d/c.  The medical decision making on this patient was of high complexity and the patient is at high risk for clinical deterioration, therefore this is a level 3 visit.  Shela Leff MD [facility]  If 7PM-7AM, please contact night-coverage [email or web address]  03/04/2020, 3:20 AM

## 2020-03-05 LAB — BASIC METABOLIC PANEL
Anion gap: 15 (ref 5–15)
BUN: 91 mg/dL — ABNORMAL HIGH (ref 8–23)
CO2: 22 mmol/L (ref 22–32)
Calcium: 8.1 mg/dL — ABNORMAL LOW (ref 8.9–10.3)
Chloride: 102 mmol/L (ref 98–111)
Creatinine, Ser: 7.62 mg/dL — ABNORMAL HIGH (ref 0.61–1.24)
GFR calc Af Amer: 8 mL/min — ABNORMAL LOW (ref >60)
GFR calc non Af Amer: 7 mL/min — ABNORMAL LOW (ref >60)
Glucose, Bld: 70 mg/dL (ref 70–99)
Potassium: 4.4 mmol/L (ref 3.5–5.1)
Sodium: 139 mmol/L (ref 135–145)

## 2020-03-05 LAB — GLUCOSE, CAPILLARY
Glucose-Capillary: 67 mg/dL — ABNORMAL LOW (ref 70–99)
Glucose-Capillary: 148 mg/dL — ABNORMAL HIGH (ref 70–99)
Glucose-Capillary: 133 mg/dL — ABNORMAL HIGH (ref 70–99)
Glucose-Capillary: 149 mg/dL — ABNORMAL HIGH (ref 70–99)
Glucose-Capillary: 176 mg/dL — ABNORMAL HIGH (ref 70–99)

## 2020-03-05 LAB — PHOSPHORUS: Phosphorus: 8.3 mg/dL — ABNORMAL HIGH (ref 2.5–4.6)

## 2020-03-05 MED ORDER — INSULIN ASPART 100 UNIT/ML ~~LOC~~ SOLN: 0.0000 [IU] | Freq: Three times a day (TID) | SUBCUTANEOUS | Status: DC

## 2020-03-05 MED ORDER — SODIUM CHLORIDE 0.9 % IV SOLN: INTRAVENOUS | Status: DC

## 2020-03-05 MED ORDER — INSULIN ASPART 100 UNIT/ML ~~LOC~~ SOLN: 0.0000 [IU] | Freq: Every day | SUBCUTANEOUS | Status: DC

## 2020-03-05 MED ORDER — SEVELAMER CARBONATE 800 MG PO TABS
800.0000 mg | ORAL_TABLET | Freq: Three times a day (TID) | ORAL | Status: DC
  Administered 2020-03-05 – 2020-03-12 (×19): 800 mg via ORAL
  Filled 2020-03-05 (×19): qty 1

## 2020-03-05 NOTE — Progress Notes (Signed)
PROGRESS NOTE    MARKES SHATSWELL  YDX:[id number] DOB: 07-13-1956 DOA: 03/03/2020 PCP: [facility]   Brief Narrative:   Benjamin [name] is a 64 y.o. male with medical history significant of hypertension, non-insulin-dependent type 2 diabetes being referred to the ED by his PCP after he was noted to have worsening renal function on labs.   He sees his primary care physician every few months. States for the past few months he has had easy bruising and bleeding of his skin. Just bumping into every day objects can cause his skin to break and start bleeding.  Denies any bleeding of his gums or history of bleeding disorder.  States he went to see his PCP and blood work revealed abnormal kidney function.  As such, he was referred to the ED for further evaluation.  Denies over-the-counter NSAID use.  He is currently finishing a 10-day course of Augmentin which was prescribed to him for "chest congestion."  Denies being diagnosed with pneumonia. He takes lisinopril-hydrochlorothiazide for high blood pressure. Reports having good urine output.  Reports having a poor appetite.  Denies nausea, vomiting, or diarrhea.  ED Course: Blood pressure significantly elevated with systolic in the 720N.  Remainder of vital signs stable.  Hemoglobin 10.9, MCV 97. Platelet count normal.  Potassium 5.9, bicarb 12, anion gap 15, blood glucose 105.  BUN 101, creatinine 8.8.  No prior labs for comparison.  UA with evidence of hyaline casts and crystalluria.  Renal ultrasound pending.  PT 12.9/INR 1.0.  Hepatic function panel pending.  SARS-CoV-2 PCR test pending.  EKG without acute changes related to hyperkalemia.  ED provider discussed the case with Dr. Johnney Ou who recommended IV fluid, bicarb drip, and Lokelma.  Nephrology will consult in a.m.  Assessment & Plan:   Principal Problem:   Acute renal failure (ARF) (HCC) Active Problems:   Hyperkalemia   Metabolic acidosis   Hypertensive urgency    Anemia  AKI on CKD stage IV/acute metabolic acidosis: Nephrology had contacted patient's PCP and reportedly his creatinine was 2.3 and GFR was 29 in December 2020.  He presented with creatinine of 8.3 with metabolic acidosis.  Patient's creatinine has started to improve but not much.  His acidosis has resolved.  Multiple lab work has been ordered by nephrology.  Bicarb drip is stopped and he has been started on IV fluids.  Management per nephrology.  Hyperkalemia: Received Lokelma in the ED.  Now resolved.  Essential hypertension/presented with hypertensive urgency: Patient takes HCTZ and lisinopril at home which are on hold due to AKI.  Blood pressure much better than yesterday.  Continue amlodipine and hydralazine.  Normocytic anemia: Hemoglobin is stable.  Easy bruising: Possibly due to qualitative platelet dysfunction in the setting of uremia.  Type 2 diabetes mellitus: Hemoglobin A1c 6.8.  Hypoglycemic this morning.  Reduce SSI to sensitive scale.  DVT prophylaxis: Place and maintain sequential compression device Start: 03/04/20 0348   Code Status: Full Code  Family Communication:  None present at bedside.  Plan of care discussed with patient in length and he verbalized understanding and agreed with it.  Status is: Inpatient  Remains inpatient appropriate because:Inpatient level of care appropriate due to severity of illness   Dispo: The patient is from: Home              Anticipated d/c is to: Home              Anticipated d/c date is: 2 days  Patient currently is not medically stable to d/c.        Estimated body mass index is 23.73 kg/m as calculated from the following:   Height as of this encounter: 6' (1.829 m).   Weight as of this encounter: 79.4 kg.      Nutritional status:               Consultants:   Nephrology  Procedures:   None  Antimicrobials:  Anti-infectives (From admission, onward)   None         Subjective: Seen  and examined.  No complaints.  Objective: Vitals:   03/04/20 1920 03/04/20 2000 03/05/20 0300 03/05/20 0733  BP: (!) 144/96 (!) 164/90 (!) 144/90 (!) 149/83  Pulse: 81 89 81 91  Resp: 19  17   Temp: 98.7 F (37.1 C) 97.7 F (36.5 C) 98.1 F (36.7 C) 98.2 F (36.8 C)  TempSrc: Oral Oral Oral Oral  SpO2: 96% 100% 100% 95%  Weight:      Height:        Intake/Output Summary (Last 24 hours) at 03/05/2020 1343 Last data filed at 03/05/2020 0900 Gross per 24 hour  Intake 1431.15 ml  Output 1200 ml  Net 231.15 ml   Filed Weights   03/04/20 0846  Weight: 79.4 kg    Examination:  General exam: Appears calm and comfortable  Respiratory system: Clear to auscultation. Respiratory effort normal. Cardiovascular system: S1 & S2 heard, RRR. No JVD, murmurs, rubs, gallops or clicks. No pedal edema. Gastrointestinal system: Abdomen is nondistended, soft and nontender. No organomegaly or masses felt. Normal bowel sounds heard. Central nervous system: Alert and oriented. No focal neurological deficits. Extremities: Symmetric 5 x 5 power. Skin: No rashes, lesions or ulcers Psychiatry: Judgement and insight appear normal. Mood & affect appropriate.    Data Reviewed: I have personally reviewed following labs and imaging studies  CBC: Recent Labs  Lab 03/03/20 1927  WBC 10.1  HGB 10.9*  HCT 34.4*  MCV 97.7  PLT 810   Basic Metabolic Panel: Recent Labs  Lab 03/03/20 1927 03/04/20 0325 03/04/20 0754 03/05/20 0416  NA 133* 133* 136 139  K 5.9* 5.9* 5.2* 4.4  CL 106 109 108 102  CO2 12* 11* 15* 22  GLUCOSE 105* 73 69* 70  BUN 101* 99* 98* 91*  CREATININE 8.83* 8.32* 8.65* 7.62*  CALCIUM 8.4* 7.7* 8.0* 8.1*  PHOS  --   --   --  8.3*   GFR: Estimated Creatinine Clearance: 11 mL/min (A) (by C-G formula based on SCr of 7.62 mg/dL (H)). Liver Function Tests: Recent Labs  Lab 03/04/20 0209  AST 18  ALT 18  ALKPHOS 60  BILITOT 0.7  PROT 6.2*  ALBUMIN 3.3*   No results  for input(s): LIPASE, AMYLASE in the last 168 hours. No results for input(s): AMMONIA in the last 168 hours. Coagulation Profile: Recent Labs  Lab 03/04/20 0209  INR 1.0   Cardiac Enzymes: No results for input(s): CKTOTAL, CKMB, CKMBINDEX, TROPONINI in the last 168 hours. BNP (last 3 results) No results for input(s): PROBNP in the last 8760 hours. HbA1C: Recent Labs    03/04/20 0325  HGBA1C 6.8*   CBG: Recent Labs  Lab 03/04/20 1100 03/04/20 1616 03/04/20 2218 03/05/20 0736 03/05/20 1122  GLUCAP 104* 89 96 67* 133*   Lipid Profile: No results for input(s): CHOL, HDL, LDLCALC, TRIG, CHOLHDL, LDLDIRECT in the last 72 hours. Thyroid Function Tests: No results for input(s): TSH, T4TOTAL, FREET4,  T3FREE, THYROIDAB in the last 72 hours. Anemia Panel: Recent Labs    03/04/20 0325 03/04/20 0358  VITAMINB12 278  --   FOLATE  --  10.0  FERRITIN 348*  --   TIBC 216*  --   IRON 145  --   RETICCTPCT  --  0.8   Sepsis Labs: No results for input(s): PROCALCITON, LATICACIDVEN in the last 168 hours.  Recent Results (from the past 240 hour(s))  SARS Coronavirus 2 by RT PCR (hospital order, performed in [facility] hospital lab) Nasopharyngeal Nasopharyngeal Swab     Status: None   Collection Time: 03/04/20  2:24 AM   Specimen: Nasopharyngeal Swab  Result Value Ref Range Status   SARS Coronavirus 2 NEGATIVE NEGATIVE Final    Comment: (NOTE) SARS-CoV-2 target nucleic acids are NOT DETECTED.  The SARS-CoV-2 RNA is generally detectable in upper and lower respiratory specimens during the acute phase of infection. The lowest concentration of SARS-CoV-2 viral copies this assay can detect is 250 copies / mL. A negative result does not preclude SARS-CoV-2 infection and should not be used as the sole basis for treatment or other patient management decisions.  A negative result may occur with improper specimen collection / handling, submission of specimen other than nasopharyngeal  swab, presence of viral mutation(s) within the areas targeted by this assay, and inadequate number of viral copies (<250 copies / mL). A negative result must be combined with clinical observations, patient history, and epidemiological information.  Fact Sheet for Patients:   StrictlyIdeas.no  Fact Sheet for Healthcare Providers: BankingDealers.co.za  This test is not yet approved or  cleared by the [place] FDA and has been authorized for detection and/or diagnosis of SARS-CoV-2 by FDA under an Emergency Use Authorization (EUA).  This EUA will remain in effect (meaning this test can be used) for the duration of the COVID-19 declaration under Section 564(b)(1) of the Act, 21 U.S.C. section 360bbb-3(b)(1), unless the authorization is terminated or revoked sooner.  Performed at [facility], [place] [address], [place], [place] [address]       Radiology Studies: US Renal  Result Date: 03/04/2020 CLINICAL DATA:  Acute kidney injury EXAM: RENAL / URINARY TRACT ULTRASOUND COMPLETE COMPARISON:  09/20/2019 FINDINGS: Right Kidney: Renal measurements: 13 x 7 x 6 cm = volume: 260 mL. Cortical echogenicity is mildly increased. No mass or hydronephrosis visualized. Left Kidney: Renal measurements: 10 x 6 x 6 cm = volume: 190 mL. Cortical echogenicity is mildly increased. No mass or hydronephrosis visualized. Bladder: Mildly lobulated bladder which is likely from under distension. No internal debris. IMPRESSION: 1. No hydronephrosis or other interval change. 2. Prominent cortical echogenicity suggest medical renal disease. Electronically Signed   By: Monte Fantasia M.D.   On: 03/04/2020 08:13    Scheduled Meds:  amLODipine  5 mg Oral Daily   hydrALAZINE  25 mg Oral Q8H   insulin aspart  0-5 Units Subcutaneous QHS   insulin aspart  0-6 Units Subcutaneous TID WC   sevelamer carbonate  800 mg Oral TID WC   Continuous Infusions:  sodium  chloride 125 mL/hr at 03/05/20 1205     LOS: 1 day   Time spent: 30 minutes   Darliss Cheney, MD [facility]  03/05/2020, 1:43 PM   To contact the attending provider between 7A-7P or the covering provider during after hours 7P-7A, please log into the web site [email or web address].

## 2020-03-05 NOTE — Plan of Care (Signed)
  Problem: Activity: Goal: Risk for activity intolerance will decrease Outcome: Progressing   

## 2020-03-05 NOTE — Progress Notes (Signed)
[facility] Progress Note  Name: Benjamin [name] MRN: [id number] DOB: 08/27/1956  Chief Complaint:  Directed to ER for outpatient labs  Subjective:  He had 700 mL as well as one unmeasured void over 6/29.  Has been on bicarb gtt.  He was bladder scanned at 0 presumably for post-void.  Received outpatient labs from his PCP and reviewed current labs and trends with patient.  He hadn't realized kidneys were that impaired.  Sometimes he will eat once a day and drink fluids - modified fasting - to help with his diabetes and I recommended discussing anything like this with his PCP as fasting is dangerous with DM and diuretics.  03/03/20 - Cr 8.3 and BUN 96, K 5.7, bicarb 13, A1c 6.6 08/28/19 - Cr 2.3, BUN 36, eGFR 29, HbA1 8.9  04/2019 Cr 1.6 01/2019 - Cr 1.6 10/20/18 - Cr 1.7 and eGFR 31, HbA1c 12   Review of systems:  Denies n/v Denies shortness of breath  Denies chest pain  No difficulty with urination  ----------- Background on consult:  Benjamin [name] is a 64 y.o. male with a history of diabetes and hypertension who had presented to an outpatient clinic for evaluation of easy bruising.  He was evaluated and had labs drawn and then was sent to the ER for reported creatinine of 9.  He states that he feels well and until he understood the serious nature of the AKI he considered not coming to the ER; after talking with his provider he agreed.  He states he's been told of abnormal kidney function for a couple of years.  Denies n/v, chest pain, or shortness of breath or diarrhea.  Eating well.  His only complaint was when working outside he found his arms would bruise more easily.  Home meds include lisinopril-HCTZ (for a couple of years) and he had been on augmentin for a URI (only had 2-3 pills left of his rx). Denies NSAID use; states was told a couple of years ago not to take advil due to kidneys.  Denies family hx of ESRD.  States that his younger brother had a stroke and this made  him get serious about his health a few years ago.  Has had HTN for 20 years and DM for a couple - he's not sure and may have had before detected.  He sees Dr. Humphrey Rolls at [facility] in [place].  Cr was 8.83 on presentation here and BUN 101.  Here he was given lokelma for K 5.9 and was given 1 liter of normal saline and then placed on a bicarb gtt.  Note that there is a renal ultrasound that was obtained in January 2021 which was suggestive of medical renal disease without hydro or worrisome lesions; did have simple cyst.  Repeat renal ultrasound today had no hydro and again demonstrated increased echogenicity c/w medical renal disease.  Covid screen negative.  Chart was marked for merge but his other chart did not have lab data that I was able to locate.  He would want to avoid dialysis if possible but would do if indicated.  He's glad we're trying adjusting meds first.    Intake/Output Summary (Last 24 hours) at 03/05/2020 0920 Last data filed at 03/04/2020 2300 Gross per 24 hour  Intake 1431.15 ml  Output 700 ml  Net 731.15 ml    Vitals:  Vitals:   03/04/20 1920 03/04/20 2000 03/05/20 0300 03/05/20 0733  BP: (!) 144/96 (!) 164/90 (!) 144/90 Marland Kitchen)  149/83  Pulse: 81 89 81 91  Resp: 19  17   Temp: 98.7 F (37.1 C) 97.7 F (36.5 C) 98.1 F (36.7 C) 98.2 F (36.8 C)  TempSrc: Oral Oral Oral Oral  SpO2: 96% 100% 100% 95%  Weight:      Height:         Physical Exam:  General adult male in bed in no acute distress HEENT normocephalic atraumatic extraocular movements intact sclera anicteric Neck supple trachea midline Lungs clear to auscultation bilaterally normal work of breathing at rest  Heart S1S2 no rub Abdomen soft nontender nondistended Extremities no edema no cyanosis or clubing Neuro alert and oriented x 3 provides hx and follows commands Psych normal mood and affect   Medications reviewed    Labs:  BMP Latest Ref Rng & Units 03/05/2020 03/04/2020 03/04/2020   Glucose 70 - 99 mg/dL 70 69(L) 73  BUN 8 - 23 mg/dL 91(H) 98(H) 99(H)  Creatinine 0.61 - 1.24 mg/dL 7.62(H) 8.65(H) 8.32(H)  Sodium 135 - 145 mmol/L 139 136 133(L)  Potassium 3.5 - 5.1 mmol/L 4.4 5.2(H) 5.9(H)  Chloride 98 - 111 mmol/L 102 108 109  CO2 22 - 32 mmol/L 22 15(L) 11(L)  Calcium 8.9 - 10.3 mg/dL 8.1(L) 8.0(L) 7.7(L)     Assessment/Plan:   # AKI  - Ongoing pre-renal insults with lisinopril-HCTZ and also with abx exposure with possible resultant AIN.  UA with glucosuria and proteinuria and 0-5 RBC.  No hydro on updated renal ultrasound; up/c ratio 2110 mg/g - Strict ins/outs - Discontinue bicarb gtt and transition to normal saline x 24 hours - Remain off of lisinopril-HCTZ   - Please avoid nephrotoxins and hold nonessential meds.  Hold augmentin  # CKD - stage IV - outpatient trends as above with Cr 2.3, eGFR 29 in 08/2019. Hx of HTN and DM predisposing and note increased echogenicity on ultrasound  - PCP is  Bertram Millard, MD at [facility] in [place]  # Proteinuria  - may be from DM - Check ANA, ANCA, SPEP, UPEP, free light chains - off of ACE due to AKI   # Hyperkalemia - Hold lisinopril - Improved with temporizing measures  - renal DM diet    # Metabolic acidosis  - improved on bicarb gtt  - discontinue and transition to normal saline as above    # HTN  - Holding lisinopril-HCTZ  - improved control - follow for need to adjust hydralazine  # Normocytic anemia  - No acute indication for ESA  - repeat CBC in AM   # Hyperphosphatemia  - Start renvela with meals for now - renal diet   # Reported easy bruising  - Platelets thankfully normal  - per primary team - note farxiga listed as a home med   Claudia Desanctis, MD 03/05/2020 10:00 AM

## 2020-03-06 LAB — CBC
HCT: 29.1 % — ABNORMAL LOW (ref 39.0–52.0)
Hemoglobin: 9.7 g/dL — ABNORMAL LOW (ref 13.0–17.0)
MCH: 30.4 pg (ref 26.0–34.0)
MCHC: 33.3 g/dL (ref 30.0–36.0)
MCV: 91.2 fL (ref 80.0–100.0)
Platelets: 342 10*3/uL (ref 150–400)
RBC: 3.19 MIL/uL — ABNORMAL LOW (ref 4.22–5.81)
RDW: 13.5 % (ref 11.5–15.5)
WBC: 9.7 10*3/uL (ref 4.0–10.5)
nRBC: 0 % (ref 0.0–0.2)

## 2020-03-06 LAB — PROTEIN ELECTRO, RANDOM URINE
Albumin ELP, Urine: 63.7 %
Alpha-1-Globulin, U: 6.1 %
Alpha-2-Globulin, U: 8.7 %
Beta Globulin, U: 12.4 %
Gamma Globulin, U: 9.1 %
Total Protein, Urine: 60.8 mg/dL

## 2020-03-06 LAB — RENAL FUNCTION PANEL
Albumin: 2.8 g/dL — ABNORMAL LOW (ref 3.5–5.0)
Anion gap: 13 (ref 5–15)
BUN: 81 mg/dL — ABNORMAL HIGH (ref 8–23)
CO2: 22 mmol/L (ref 22–32)
Calcium: 7.9 mg/dL — ABNORMAL LOW (ref 8.9–10.3)
Chloride: 101 mmol/L (ref 98–111)
Creatinine, Ser: 7.27 mg/dL — ABNORMAL HIGH (ref 0.61–1.24)
GFR calc Af Amer: 8 mL/min — ABNORMAL LOW (ref >60)
GFR calc non Af Amer: 7 mL/min — ABNORMAL LOW (ref >60)
Glucose, Bld: 93 mg/dL (ref 70–99)
Phosphorus: 6.9 mg/dL — ABNORMAL HIGH (ref 2.5–4.6)
Potassium: 4.2 mmol/L (ref 3.5–5.1)
Sodium: 136 mmol/L (ref 135–145)

## 2020-03-06 LAB — KAPPA/LAMBDA LIGHT CHAINS
Kappa free light chain: 72.1 mg/L — ABNORMAL HIGH (ref 3.3–19.4)
Kappa, lambda light chain ratio: 2.38 — ABNORMAL HIGH (ref 0.26–1.65)
Lambda free light chains: 30.3 mg/L — ABNORMAL HIGH (ref 5.7–26.3)

## 2020-03-06 LAB — PROTEIN ELECTROPHORESIS, SERUM
A/G Ratio: 1.3 (ref 0.7–1.7)
Albumin ELP: 3 g/dL (ref 2.9–4.4)
Alpha-1-Globulin: 0.2 g/dL (ref 0.0–0.4)
Alpha-2-Globulin: 0.7 g/dL (ref 0.4–1.0)
Beta Globulin: 0.8 g/dL (ref 0.7–1.3)
Gamma Globulin: 0.7 g/dL (ref 0.4–1.8)
Globulin, Total: 2.3 g/dL (ref 2.2–3.9)
Total Protein ELP: 5.3 g/dL — ABNORMAL LOW (ref 6.0–8.5)

## 2020-03-06 LAB — GLUCOSE, CAPILLARY
Glucose-Capillary: 85 mg/dL (ref 70–99)
Glucose-Capillary: 172 mg/dL — ABNORMAL HIGH (ref 70–99)
Glucose-Capillary: 162 mg/dL — ABNORMAL HIGH (ref 70–99)
Glucose-Capillary: 111 mg/dL — ABNORMAL HIGH (ref 70–99)

## 2020-03-06 LAB — ANA: Anti Nuclear Antibody (ANA): POSITIVE — AB

## 2020-03-06 MED ORDER — SODIUM CHLORIDE 0.9 % IV SOLN: INTRAVENOUS | Status: DC

## 2020-03-06 NOTE — Plan of Care (Signed)
  Problem: Education: Goal: Knowledge of General Education information will improve Description Including pain rating scale, medication(s)/side effects and non-pharmacologic comfort measures Outcome: Progressing   

## 2020-03-06 NOTE — Progress Notes (Signed)
PROGRESS NOTE    Benjamin [name]  VOJ:[id number] DOB: 03-14-1956 DOA: 03/03/2020 PCP: [facility]   Brief Narrative:   Benjamin [name] is a 64 y.o. male with medical history significant of hypertension, non-insulin-dependent type 2 diabetes being referred to the ED by his PCP after he was noted to have worsening renal function on labs.   He sees his primary care physician every few months. States for the past few months he has had easy bruising and bleeding of his skin. Just bumping into every day objects can cause his skin to break and start bleeding.  Denies any bleeding of his gums or history of bleeding disorder.  States he went to see his PCP and blood work revealed abnormal kidney function.  As such, he was referred to the ED for further evaluation.  Denies over-the-counter NSAID use.  He is currently finishing a 10-day course of Augmentin which was prescribed to him for "chest congestion."  Denies being diagnosed with pneumonia. He takes lisinopril-hydrochlorothiazide for high blood pressure. Reports having good urine output.  Reports having a poor appetite.  Denies nausea, vomiting, or diarrhea.  Upon arrival to ED, blood pressure significantly elevated with systolic in the 993Z.  Remainder of vital signs stable.  Hemoglobin 10.9, MCV 97. Platelet count normal.  Potassium 5.9, bicarb 12, anion gap 15, blood glucose 105.  BUN 101, creatinine 8.8.  No prior labs for comparison.  UA with evidence of hyaline casts and crystalluria. EKG without acute changes related to hyperkalemia. ED provider discussed the case with Dr. Johnney Ou who recommended IV fluid, bicarb drip, and Lokelma.  Nephrology will consult in a.m. admitted to hospitalist service.  Assessment & Plan:   Principal Problem:   Acute renal failure (ARF) (HCC) Active Problems:   Hyperkalemia   Metabolic acidosis   Hypertensive urgency   Anemia  AKI on CKD stage IV/acute metabolic acidosis: Nephrology had contacted  patient's PCP and reportedly his creatinine was 2.3 and GFR was 29 in December 2020.  He presented with creatinine of 8.3 with metabolic acidosis.  Patient's creatinine has started to improve but very slowly.  His acidosis has resolved.  Multiple lab work has been ordered by nephrology.  He remains on normal saline.  Per nephrology, he may need to stay in the hospital for several days.  Hyperkalemia: Received Lokelma in the ED.  Now resolved.  Essential hypertension/presented with hypertensive urgency: Patient takes HCTZ and lisinopril at home which are on hold due to AKI.  Blood pressure controlled.  Continue amlodipine and hydralazine.  Normocytic anemia: Hemoglobin is stable.  Easy bruising: Possibly due to qualitative platelet dysfunction in the setting of uremia.  Type 2 diabetes mellitus: Hemoglobin A1c 6.8.  Hypoglycemic this morning.  Reduce SSI to sensitive scale.  DVT prophylaxis: Place and maintain sequential compression device Start: 03/04/20 0348   Code Status: Full Code  Family Communication:  None present at bedside.  Plan of care discussed with patient in length and he verbalized understanding and agreed with it.  Status is: Inpatient  Remains inpatient appropriate because:Inpatient level of care appropriate due to severity of illness   Dispo: The patient is from: Home              Anticipated d/c is to: Home              Anticipated d/c date is: 2 days              Patient currently is not  medically stable to d/c.        Estimated body mass index is 23.73 kg/m as calculated from the following:   Height as of this encounter: 6' (1.829 m).   Weight as of this encounter: 79.4 kg.      Nutritional status:               Consultants:   Nephrology  Procedures:   None  Antimicrobials:  Anti-infectives (From admission, onward)   None         Subjective: Patient seen and examined.  He has no complaints.  Walking around in the  room.  Objective: Vitals:   03/05/20 1417 03/05/20 2000 03/06/20 0344 03/06/20 0742  BP: (!) 165/89 (!) 155/87 137/81 134/78  Pulse: 83 98 90 95  Resp: 17 17 17    Temp: (!) 97.4 F (36.3 C) 98.1 F (36.7 C) 98.2 F (36.8 C) 98.2 F (36.8 C)  TempSrc: Oral Oral Oral Oral  SpO2: 97% 95% 95% 95%  Weight:      Height:        Intake/Output Summary (Last 24 hours) at 03/06/2020 1306 Last data filed at 03/05/2020 1820 Gross per 24 hour  Intake 485.67 ml  Output 700 ml  Net -214.33 ml   Filed Weights   03/04/20 0846  Weight: 79.4 kg    Examination:  General exam: Appears calm and comfortable  Respiratory system: Clear to auscultation. Respiratory effort normal. Cardiovascular system: S1 & S2 heard, RRR. No JVD, murmurs, rubs, gallops or clicks. No pedal edema. Gastrointestinal system: Abdomen is nondistended, soft and nontender. No organomegaly or masses felt. Normal bowel sounds heard. Central nervous system: Alert and oriented. No focal neurological deficits. Extremities: Symmetric 5 x 5 power. Skin: No rashes, lesions or ulcers.  Psychiatry: Judgement and insight appear normal. Mood & affect appropriate.     Data Reviewed: I have personally reviewed following labs and imaging studies  CBC: Recent Labs  Lab 03/03/20 1927 03/06/20 0422  WBC 10.1 9.7  HGB 10.9* 9.7*  HCT 34.4* 29.1*  MCV 97.7 91.2  PLT 392 326   Basic Metabolic Panel: Recent Labs  Lab 03/03/20 1927 03/04/20 0325 03/04/20 0754 03/05/20 0416 03/06/20 0422  NA 133* 133* 136 139 136  K 5.9* 5.9* 5.2* 4.4 4.2  CL 106 109 108 102 101  CO2 12* 11* 15* 22 22  GLUCOSE 105* 73 69* 70 93  BUN 101* 99* 98* 91* 81*  CREATININE 8.83* 8.32* 8.65* 7.62* 7.27*  CALCIUM 8.4* 7.7* 8.0* 8.1* 7.9*  PHOS  --   --   --  8.3* 6.9*   GFR: Estimated Creatinine Clearance: 11.6 mL/min (A) (by C-G formula based on SCr of 7.27 mg/dL (H)). Liver Function Tests: Recent Labs  Lab 03/04/20 0209 03/06/20 0422  AST  18  --   ALT 18  --   ALKPHOS 60  --   BILITOT 0.7  --   PROT 6.2*  --   ALBUMIN 3.3* 2.8*   No results for input(s): LIPASE, AMYLASE in the last 168 hours. No results for input(s): AMMONIA in the last 168 hours. Coagulation Profile: Recent Labs  Lab 03/04/20 0209  INR 1.0   Cardiac Enzymes: No results for input(s): CKTOTAL, CKMB, CKMBINDEX, TROPONINI in the last 168 hours. BNP (last 3 results) No results for input(s): PROBNP in the last 8760 hours. HbA1C: Recent Labs    03/04/20 0325  HGBA1C 6.8*   CBG: Recent Labs  Lab 03/05/20 1122 03/05/20 1646  03/05/20 2002 03/06/20 0744 03/06/20 1211  GLUCAP 133* 149* 176* 85 172*   Lipid Profile: No results for input(s): CHOL, HDL, LDLCALC, TRIG, CHOLHDL, LDLDIRECT in the last 72 hours. Thyroid Function Tests: No results for input(s): TSH, T4TOTAL, FREET4, T3FREE, THYROIDAB in the last 72 hours. Anemia Panel: Recent Labs    03/04/20 0325 03/04/20 0358  VITAMINB12 278  --   FOLATE  --  10.0  FERRITIN 348*  --   TIBC 216*  --   IRON 145  --   RETICCTPCT  --  0.8   Sepsis Labs: No results for input(s): PROCALCITON, LATICACIDVEN in the last 168 hours.  Recent Results (from the past 240 hour(s))  SARS Coronavirus 2 by RT PCR (hospital order, performed in [facility] hospital lab) Nasopharyngeal Nasopharyngeal Swab     Status: None   Collection Time: 03/04/20  2:24 AM   Specimen: Nasopharyngeal Swab  Result Value Ref Range Status   SARS Coronavirus 2 NEGATIVE NEGATIVE Final    Comment: (NOTE) SARS-CoV-2 target nucleic acids are NOT DETECTED.  The SARS-CoV-2 RNA is generally detectable in upper and lower respiratory specimens during the acute phase of infection. The lowest concentration of SARS-CoV-2 viral copies this assay can detect is 250 copies / mL. A negative result does not preclude SARS-CoV-2 infection and should not be used as the sole basis for treatment or other patient management decisions.  A negative  result may occur with improper specimen collection / handling, submission of specimen other than nasopharyngeal swab, presence of viral mutation(s) within the areas targeted by this assay, and inadequate number of viral copies (<250 copies / mL). A negative result must be combined with clinical observations, patient history, and epidemiological information.  Fact Sheet for Patients:   StrictlyIdeas.no  Fact Sheet for Healthcare Providers: BankingDealers.co.za  This test is not yet approved or  cleared by the [place] FDA and has been authorized for detection and/or diagnosis of SARS-CoV-2 by FDA under an Emergency Use Authorization (EUA).  This EUA will remain in effect (meaning this test can be used) for the duration of the COVID-19 declaration under Section 564(b)(1) of the Act, 21 U.S.C. section 360bbb-3(b)(1), unless the authorization is terminated or revoked sooner.  Performed at [facility], [place] [address] [place], [place] [address]       Radiology Studies: No results found.  Scheduled Meds: . amLODipine  5 mg Oral Daily  . hydrALAZINE  25 mg Oral Q8H  . insulin aspart  0-5 Units Subcutaneous QHS  . insulin aspart  0-6 Units Subcutaneous TID WC  . sevelamer carbonate  800 mg Oral TID WC   Continuous Infusions: . sodium chloride 125 mL/hr at 03/06/20 1104     LOS: 2 days   Time spent: 25 minutes   Darliss Cheney, MD [facility]  03/06/2020, 1:06 PM   To contact the attending provider between 7A-7P or the covering provider during after hours 7P-7A, please log into the web site [email or web address].

## 2020-03-06 NOTE — Progress Notes (Signed)
Notified Dr. Doristine Bosworth about pt refusing insulin due to eating prior to getting blood sugar checked. Pt states he "does not want to be stuck for only 1 unit." Per Dr. Doristine Bosworth, will continue to keep pt on insulin regimen until further notice. Provider aware pt is declining 1 unit of insulin. Educated pt on calling before eating so we can take blood sugar beforehand.

## 2020-03-06 NOTE — Progress Notes (Signed)
[facility] Progress Note  Name: Benjamin [name] MRN: [id number] DOB: [birth year]  Chief Complaint:  Directed to ER for outpatient labs  Subjective:  He had 1.4 L uop over 6/30 charted.  Has been on normal saline at 125/hr.  Feels good.  We discussed his labs and though they're improving they are improving slowly.  We discussed our recommendation for several more days of monitoring and he's ok with this - wants to get better.  Review of systems:  Denies n/v  Denies shortness of breath  Denies chest pain  He's had no difficulty with urination  ----------- Background on consult:  Benjamin [name] is a 64 y.o. male with a history of diabetes and hypertension who had presented to an outpatient clinic for evaluation of easy bruising.  He was evaluated and had labs drawn and then was sent to the ER for reported creatinine of 9.  He states that he feels well and until he understood the serious nature of the AKI he considered not coming to the ER; after talking with his provider he agreed.  He states he's been told of abnormal kidney function for a couple of years.  Denies n/v, chest pain, or shortness of breath or diarrhea.  Eating well.  His only complaint was when working outside he found his arms would bruise more easily.  Home meds include lisinopril-HCTZ (for a couple of years) and he had been on augmentin for a URI (only had 2-3 pills left of his rx). Denies NSAID use; states was told a couple of years ago not to take advil due to kidneys.  Denies family hx of ESRD.  States that his younger brother had a stroke and this made him get serious about his health a few years ago.  Has had HTN for 20 years and DM for a couple - he's not sure and may have had before detected.  He sees Dr. Humphrey Rolls at [facility] in [place].  Cr was 8.83 on presentation here and BUN 101.  Here he was given lokelma for K 5.9 and was given 1 liter of normal saline and then placed on a bicarb gtt.   Note that there is a renal ultrasound that was obtained in January 2021 which was suggestive of medical renal disease without hydro or worrisome lesions; did have simple cyst.  Repeat renal ultrasound today had no hydro and again demonstrated increased echogenicity c/w medical renal disease.  Covid screen negative.  Chart was marked for merge but his other chart did not have lab data that I was able to locate.  He would want to avoid dialysis if possible but would do if indicated.  He's glad we're trying adjusting meds first.   Outpatient lab trends: 03/03/20 - Cr 8.3 and BUN 96, K 5.7, bicarb 13, A1c 6.6 08/28/19 - Cr 2.3, BUN 36, eGFR 29, HbA1 8.9  04/2019 Cr 1.6 01/2019 - Cr 1.6 10/20/18 - Cr 1.7 and eGFR 31, HbA1c 12    Intake/Output Summary (Last 24 hours) at 03/06/2020 0837 Last data filed at 03/05/2020 1820 Gross per 24 hour  Intake 485.67 ml  Output 1350 ml  Net -864.33 ml    Vitals:  Vitals:   03/05/20 1417 03/05/20 2000 03/06/20 0344 03/06/20 0742  BP: (!) 165/89 (!) 155/87 137/81 134/78  Pulse: 83 98 90 95  Resp: _0 Temp: (!) 97.4 F (36.3 C) 98.1 F (36.7 C) 98.2 F (36.8 C) 98.2 F (36.8  C)  TempSrc: Oral Oral Oral Oral  SpO2: 97% 95% 95% 95%  Weight:      Height:         Physical Exam:  General adult male in bed in no acute distress  HEENT normocephalic atraumatic extraocular movements intact sclera anicteric Neck supple trachea midline Lungs clear to auscultation bilaterally normal work of breathing at rest  Heart S1S2 no rub Abdomen soft nontender nondistended Extremities no edema no cyanosis or clubbing Neuro alert and oriented x 3 provides hx and follows commands Psych normal mood and affect   Medications reviewed    Labs:  BMP Latest Ref Rng & Units 03/06/2020 03/05/2020 03/04/2020  Glucose 70 - 99 mg/dL 93 70 69(L)  BUN 8 - 23 mg/dL 81(H) 91(H) 98(H)  Creatinine 0.61 - 1.24 mg/dL 7.27(H) 7.62(H) 8.65(H)  Sodium 135 - 145 mmol/L 136 139 136   Potassium 3.5 - 5.1 mmol/L 4.2 4.4 5.2(H)  Chloride 98 - 111 mmol/L 101 102 108  CO2 22 - 32 mmol/L 22 22 15(L)  Calcium 8.9 - 10.3 mg/dL 7.9(L) 8.1(L) 8.0(L)     Assessment/Plan:   # AKI  - Ongoing pre-renal insults with lisinopril-HCTZ and also with abx exposure with possible resultant AIN.  Note he did have CKD at baseline as below with reduced renal reserve.  UA with glucosuria and proteinuria and 0-5 RBC.  No hydro on updated renal ultrasound; up/c ratio 2110 mg/g - Strict ins/outs - Continue normal saline x 24 hours  - Remain off of lisinopril-HCTZ  (please do not restart on discharge) - Please avoid nephrotoxins and hold nonessential meds.  Hold augmentin and would not give in the future in the event this was AIN   # CKD - stage IV - outpatient trends as above with Cr 2.3, eGFR 29 in 08/2019. Hx of HTN and DM predisposing and note increased echogenicity on ultrasound  - PCP is  Bertram Millard, MD at [facility] in [place]  # Proteinuria  - may be from DM - pending work-up: ANA, ANCA, SPEP, UPEP, free light chains; send complement - off of ACE due to AKI   # Hyperkalemia  - Hold lisinopril - Improved with temporizing measures  - renal DM diet    # Metabolic acidosis  - improved on bicarb gtt  - setting of AKI on CKD   # HTN  - Holding lisinopril-HCTZ  - improved control - follow for need to adjust hydralazine  # Normocytic anemia   - No acute indication for ESA but may need soon - iron is replete   - CBC with diff on 7/2  # Hyperphosphatemia  - continue renvela with meals for now - renal diet - improving   # Reported easy bruising  - Platelets thankfully normal    Claudia Desanctis, MD 03/06/2020 9:03 AM

## 2020-03-06 NOTE — Progress Notes (Signed)
Inpatient Diabetes Program Recommendations  AACE/ADA: New Consensus Statement on Inpatient Glycemic Control (2015)  Target Ranges:  Prepandial:   less than 140 mg/dL      Peak postprandial:   less than 180 mg/dL (1-2 hours)      Critically ill patients:  140 - 180 mg/dL   Lab Results  Component Value Date   GLUCAP 85 03/06/2020   HGBA1C 6.8 (H) 03/04/2020    Review of Glycemic Control  Diabetes history: DM 2 Outpatient Diabetes medications: Farxiga 5 mg daily Current orders for Inpatient glycemic control:  Novolog 0-6 units tid + hs scale  A1c 6.8% on 6/29  Inpatient Diabetes Program Recommendations:    -  Noted pt on Farxiga at home, an SGLT 2 inhibitor. The FDA has issued alerts to AKI in these medications.   -  Recommend not restarting this medication at time of d/c and have pt follow up with prescriber.  Pt may benefit from [place] outpatient as it is fecally cleared.  Will monitor glucose trends inpatient.  Thanks, Tama Headings RN, MSN, BC-ADM Inpatient Diabetes Coordinator Team Pager [phone] (8a-5p)

## 2020-03-07 LAB — CBC WITH DIFFERENTIAL/PLATELET
Abs Immature Granulocytes: 0.02 10*3/uL (ref 0.00–0.07)
Basophils Absolute: 0.1 10*3/uL (ref 0.0–0.1)
Basophils Relative: 1 %
Eosinophils Absolute: 0.8 10*3/uL — ABNORMAL HIGH (ref 0.0–0.5)
Eosinophils Relative: 7 %
HCT: 28.6 % — ABNORMAL LOW (ref 39.0–52.0)
Hemoglobin: 9.5 g/dL — ABNORMAL LOW (ref 13.0–17.0)
Immature Granulocytes: 0 %
Lymphocytes Relative: 21 %
Lymphs Abs: 2.3 10*3/uL (ref 0.7–4.0)
MCH: 30.6 pg (ref 26.0–34.0)
MCHC: 33.2 g/dL (ref 30.0–36.0)
MCV: 92.3 fL (ref 80.0–100.0)
Monocytes Absolute: 0.9 10*3/uL (ref 0.1–1.0)
Monocytes Relative: 8 %
Neutro Abs: 7 10*3/uL (ref 1.7–7.7)
Neutrophils Relative %: 63 %
Platelets: 313 10*3/uL (ref 150–400)
RBC: 3.1 MIL/uL — ABNORMAL LOW (ref 4.22–5.81)
RDW: 13.6 % (ref 11.5–15.5)
WBC: 11.1 10*3/uL — ABNORMAL HIGH (ref 4.0–10.5)
nRBC: 0 % (ref 0.0–0.2)

## 2020-03-07 LAB — RENAL FUNCTION PANEL
Albumin: 2.8 g/dL — ABNORMAL LOW (ref 3.5–5.0)
Anion gap: 11 (ref 5–15)
BUN: 66 mg/dL — ABNORMAL HIGH (ref 8–23)
CO2: 21 mmol/L — ABNORMAL LOW (ref 22–32)
Calcium: 7.8 mg/dL — ABNORMAL LOW (ref 8.9–10.3)
Chloride: 104 mmol/L (ref 98–111)
Creatinine, Ser: 6.58 mg/dL — ABNORMAL HIGH (ref 0.61–1.24)
GFR calc Af Amer: 10 mL/min — ABNORMAL LOW (ref >60)
GFR calc non Af Amer: 8 mL/min — ABNORMAL LOW (ref >60)
Glucose, Bld: 81 mg/dL (ref 70–99)
Phosphorus: 6.1 mg/dL — ABNORMAL HIGH (ref 2.5–4.6)
Potassium: 4.1 mmol/L (ref 3.5–5.1)
Sodium: 136 mmol/L (ref 135–145)

## 2020-03-07 LAB — GLUCOSE, CAPILLARY
Glucose-Capillary: 73 mg/dL (ref 70–99)
Glucose-Capillary: 87 mg/dL (ref 70–99)
Glucose-Capillary: 104 mg/dL — ABNORMAL HIGH (ref 70–99)
Glucose-Capillary: 92 mg/dL (ref 70–99)

## 2020-03-07 LAB — C3 COMPLEMENT: C3 Complement: 91 mg/dL (ref 82–167)

## 2020-03-07 LAB — C4 COMPLEMENT: Complement C4, Body Fluid: 22 mg/dL (ref 12–38)

## 2020-03-07 LAB — MPO/PR-3 (ANCA) ANTIBODIES
ANCA Proteinase 3: <3.5 U/mL (ref 0.0–3.5)
Myeloperoxidase Abs: <9 U/mL (ref 0.0–9.0)

## 2020-03-07 MED ORDER — SODIUM CHLORIDE 0.9 % IV SOLN: INTRAVENOUS | Status: AC

## 2020-03-07 NOTE — Plan of Care (Signed)
  Problem: Education: Goal: Knowledge of General Education information will improve Description Including pain rating scale, medication(s)/side effects and non-pharmacologic comfort measures Outcome: Progressing   

## 2020-03-07 NOTE — Progress Notes (Addendum)
[facility] Progress Note  Name: Benjamin [name] MRN: [id number] DOB: 08/06/1956  Chief Complaint:  Directed to ER for outpatient labs  Subjective:  Didn't have strict ins/outs over 7/1 - had two unmeasured voids charted.  Has been on normal saline at 125/hr.  We discussed labs getting better but still very impaired.  He's saving his urine - they're not charting.  D/w new MA today and she is going to chart.  He states he's had augmentin several times before and never had a problem.  Review of systems:    Denies n/v  Denies shortness of breath  Denies chest pain  He's had no difficulty with urination  ----------- Background on consult:  Benjamin [name] is a 64 y.o. male with a history of diabetes and hypertension who had presented to an outpatient clinic for evaluation of easy bruising.  He was evaluated and had labs drawn and then was sent to the ER for reported creatinine of 9.  He states that he feels well and until he understood the serious nature of the AKI he considered not coming to the ER; after talking with his provider he agreed.  He states he's been told of abnormal kidney function for a couple of years.  Denies n/v, chest pain, or shortness of breath or diarrhea.  Eating well.  His only complaint was when working outside he found his arms would bruise more easily.  Home meds include lisinopril-HCTZ (for a couple of years) and he had been on augmentin for a URI (only had 2-3 pills left of his rx). Denies NSAID use; states was told a couple of years ago not to take advil due to kidneys.  Denies family hx of ESRD.  States that his younger brother had a stroke and this made him get serious about his health a few years ago.  Has had HTN for 20 years and DM for a couple - he's not sure and may have had before detected.  He sees Dr. Humphrey Rolls at [facility] in [place].  Cr was 8.83 on presentation here and BUN 101.  Here he was given lokelma for K 5.9 and was given 1  liter of normal saline and then placed on a bicarb gtt.  Note that there is a renal ultrasound that was obtained in January 2021 which was suggestive of medical renal disease without hydro or worrisome lesions; did have simple cyst.  Repeat renal ultrasound today had no hydro and again demonstrated increased echogenicity c/w medical renal disease.  Covid screen negative.  Chart was marked for merge but his other chart did not have lab data that I was able to locate.  He would want to avoid dialysis if possible but would do if indicated.  He's glad we're trying adjusting meds first.   Outpatient lab trends: 03/03/20 - Cr 8.3 and BUN 96, K 5.7, bicarb 13, A1c 6.6 08/28/19 - Cr 2.3, BUN 36, eGFR 29, HbA1 8.9  04/2019 Cr 1.6 01/2019 - Cr 1.6 10/20/18 - Cr 1.7 and eGFR 31, HbA1c 12    Intake/Output Summary (Last 24 hours) at 03/07/2020 0758 Last data filed at 03/07/2020 0314 Gross per 24 hour  Intake 2002.85 ml  Output --  Net 2002.85 ml    Vitals:  Vitals:   03/06/20 1603 03/06/20 2053 03/06/20 2232 03/07/20 0500  BP: (!) 158/85 (!) 178/97 (!) 147/87 131/73  Pulse: 82 96 97 93  Resp:  _0 Temp: 97.9 F (36.6  C) 97.8 F (36.6 C) 98.4 F (36.9 C) 98.3 F (36.8 C)  TempSrc: Oral Oral Oral Oral  SpO2: 98% 96% 96% 98%  Weight:      Height:         Physical Exam:  General adult male in bed in no acute distress HEENT normocephalic atraumatic extraocular movements intact sclera anicteric; does have periorbital edema  Neck supple trachea midline Lungs clear to auscultation bilaterally normal work of breathing at rest  Heart S1S2 no rub Abdomen soft nontender nondistended Extremities no edema no cyanosis or clubbing Neuro alert and oriented x 3 provides hx and follows commands Psych normal mood and affect   Medications reviewed    Labs:  BMP Latest Ref Rng & Units 03/07/2020 03/06/2020 03/05/2020  Glucose 70 - 99 mg/dL 81 93 70  BUN 8 - 23 mg/dL 66(H) 81(H) 91(H)  Creatinine 0.61 -  1.24 mg/dL 6.58(H) 7.27(H) 7.62(H)  Sodium 135 - 145 mmol/L 136 136 139  Potassium 3.5 - 5.1 mmol/L 4.1 4.2 4.4  Chloride 98 - 111 mmol/L 104 101 102  CO2 22 - 32 mmol/L 21(L) 22 22  Calcium 8.9 - 10.3 mg/dL 7.8(L) 7.9(L) 8.1(L)     Assessment/Plan:   # AKI  - Ongoing pre-renal insults with lisinopril-HCTZ and also with abx exposure with possible resultant AIN.  Note he did have CKD at baseline as below with reduced renal reserve.  UA with glucosuria and proteinuria and 0-5 RBC.  No hydro on updated renal ultrasound; up/c ratio 2110 mg/g.  Note 7% serum eos  - Strict ins/outs - discussed importance  - Continue normal saline x 24 hours - reduce to 75/hr - Remain off of lisinopril-HCTZ  (please do not restart on discharge) - Please avoid nephrotoxins and hold nonessential meds.  Hold augmentin and would not give in the future in the event this was AIN related to augmentin - however he reports has tolerated previously   # CKD - stage IV - outpatient trends as above with Cr 2.3, eGFR 29 in 08/2019. Hx of HTN and DM predisposing and note increased echogenicity on ultrasound  - PCP is  Bertram Millard, MD at [facility] in [place]  # Proteinuria  - may be from DM - ANCA and complement pending.  Other work-up with ANA positive, SPEP no M spike, UPEP no M spike, free light chain ratio slightly elevated but with CKD; complement pending - would repeat free light chain ratio as an outpatient - discussed finding with pt - off of ACE due to AKI  - recommend outpt rheum eval as below  # Hyperkalemia - Hold lisinopril - resolved with temporizing measures  - renal DM diet    # Metabolic acidosis  - improved s/p bicarb gtt  - setting of AKI on CKD   # HTN  - Holding lisinopril-HCTZ  - improved control - follow for need to adjust hydralazine  # Normocytic anemia   - No acute indication for ESA but may need soon - iron is replete    # Hyperphosphatemia  - continue  renvela with meals for now - renal diet - improving   # ANA positive - would recommend outpatient rheum evaluation  - discussed with pt  # Reported easy bruising  - Platelets thankfully normal    Claudia Desanctis, MD 03/07/2020 8:17 AM

## 2020-03-07 NOTE — Plan of Care (Signed)

## 2020-03-07 NOTE — Progress Notes (Signed)
PROGRESS NOTE    JAQUIN COY  HER:[id number] DOB: 1955-09-30 DOA: 03/03/2020 PCP: [facility]   Brief Narrative:   Benjamin [name] is a 64 y.o. male with medical history significant of hypertension, non-insulin-dependent type 2 diabetes being referred to the ED by his PCP after he was noted to have worsening renal function on labs.   He sees his primary care physician every few months. States for the past few months he has had easy bruising and bleeding of his skin. Just bumping into every day objects can cause his skin to break and start bleeding.  Denies any bleeding of his gums or history of bleeding disorder.  States he went to see his PCP and blood work revealed abnormal kidney function.  As such, he was referred to the ED for further evaluation.  Denies over-the-counter NSAID use.  He is currently finishing a 10-day course of Augmentin which was prescribed to him for "chest congestion."  Denies being diagnosed with pneumonia. He takes lisinopril-hydrochlorothiazide for high blood pressure. Reports having good urine output.  Reports having a poor appetite.  Denies nausea, vomiting, or diarrhea.  Upon arrival to ED, blood pressure significantly elevated with systolic in the 856D.  Remainder of vital signs stable.  Hemoglobin 10.9, MCV 97. Platelet count normal.  Potassium 5.9, bicarb 12, anion gap 15, blood glucose 105.  BUN 101, creatinine 8.8.  No prior labs for comparison.  UA with evidence of hyaline casts and crystalluria. EKG without acute changes related to hyperkalemia. ED provider discussed the case with Dr. Johnney Ou who recommended IV fluid, bicarb drip, and Lokelma.  Nephrology will consult in a.m. admitted to hospitalist service.  Assessment & Plan:   Principal Problem:   Acute renal failure (ARF) (HCC) Active Problems:   Hyperkalemia   Metabolic acidosis   Hypertensive urgency   Anemia  AKI on CKD stage IV/acute metabolic acidosis: Nephrology had contacted  patient's PCP and reportedly his creatinine was 2.3 and GFR was 29 in December 2020.  He presented with creatinine of 8.3 with metabolic acidosis.  Patient's creatinine has started to improve but very slowly.  His acidosis has resolved.  Multiple lab work has been ordered by nephrology.  He remains on normal saline.  Per nephrology, he may need to stay in the hospital for several days but patient is getting anxious to get out of here.  He is convinced for now to stay here.  Holding all nephrotoxic agents.  Hyperkalemia: Received Lokelma in the ED.  Now resolved.  Essential hypertension/presented with hypertensive urgency: Patient takes HCTZ and lisinopril at home which are on hold due to AKI.  Blood pressure labile but mostly controlled..  Continue amlodipine and hydralazine.  Normocytic anemia: Hemoglobin is stable.  Easy bruising: Possibly due to qualitative platelet dysfunction in the setting of uremia.  Type 2 diabetes mellitus: Hemoglobin A1c 6.8.  Intermittent hypoglycemia.  Continue sensitive scale SSI.  DVT prophylaxis: Place and maintain sequential compression device Start: 03/04/20 0348   Code Status: Full Code  Family Communication:  None present at bedside.  Plan of care discussed with patient in length and he verbalized understanding and agreed with it.  Status is: Inpatient  Remains inpatient appropriate because:Inpatient level of care appropriate due to severity of illness   Dispo: The patient is from: Home              Anticipated d/c is to: Home  Anticipated d/c date is: 2 days              Patient currently is not medically stable to d/c.        Estimated body mass index is 23.73 kg/m as calculated from the following:   Height as of this encounter: 6' (1.829 m).   Weight as of this encounter: 79.4 kg.      Nutritional status:               Consultants:   Nephrology  Procedures:   None  Antimicrobials:  Anti-infectives (From  admission, onward)   None         Subjective: Seen and examined.  He has no complaints but he is getting anxious and impatient to get out of here stating that he is allowed to die and he cannot sit here and do nothing.  I had a long discussion with him.  He is convinced for now to stay here.  Tells me that he has 2 appointments next week on Tuesday and Wednesday with allergy specialist and ophthalmologist and it took him several months to get this appointment and he does not want to cancel them.  I told him that very likely, he will be discharged before then but to be on the safe side, [place] call them and reschedule them for the following week.  Objective: Vitals:   03/06/20 2232 03/07/20 0500 03/07/20 0937 03/07/20 1329  BP: (!) 147/87 131/73 (!) 146/91 (!) 150/90  Pulse: 97 93 94 92  Resp: 16 14 16 16   Temp: 98.4 F (36.9 C) 98.3 F (36.8 C) 97.6 F (36.4 C) 98 F (36.7 C)  TempSrc: Oral Oral Oral Oral  SpO2: 96% 98% 97% 94%  Weight:      Height:        Intake/Output Summary (Last 24 hours) at 03/07/2020 1421 Last data filed at 03/07/2020 0314 Gross per 24 hour  Intake 2002.85 ml  Output --  Net 2002.85 ml   Filed Weights   03/04/20 0846  Weight: 79.4 kg    Examination:  General exam: Appears calm and comfortable, periorbital edema Respiratory system: Clear to auscultation. Respiratory effort normal. Cardiovascular system: S1 & S2 heard, RRR. No JVD, murmurs, rubs, gallops or clicks. No pedal edema. Gastrointestinal system: Abdomen is nondistended, soft and nontender. No organomegaly or masses felt. Normal bowel sounds heard. Central nervous system: Alert and oriented. No focal neurological deficits. Extremities: Symmetric 5 x 5 power. Skin: No rashes, lesions or ulcers.  Psychiatry: Judgement and insight appear normal. Mood & affect appropriate.   Data Reviewed: I have personally reviewed following labs and imaging studies  CBC: Recent Labs  Lab 03/03/20 1927  03/06/20 0422 03/07/20 0234  WBC 10.1 9.7 11.1*  NEUTROABS  --   --  7.0  HGB 10.9* 9.7* 9.5*  HCT 34.4* 29.1* 28.6*  MCV 97.7 91.2 92.3  PLT 392 342 485   Basic Metabolic Panel: Recent Labs  Lab 03/04/20 0325 03/04/20 0754 03/05/20 0416 03/06/20 0422 03/07/20 0234  NA 133* 136 139 136 136  K 5.9* 5.2* 4.4 4.2 4.1  CL 109 108 102 101 104  CO2 11* 15* 22 22 21*  GLUCOSE 73 69* 70 93 81  BUN 99* 98* 91* 81* 66*  CREATININE 8.32* 8.65* 7.62* 7.27* 6.58*  CALCIUM 7.7* 8.0* 8.1* 7.9* 7.8*  PHOS  --   --  8.3* 6.9* 6.1*   GFR: Estimated Creatinine Clearance: 12.8 mL/min (A) (by C-G formula  based on SCr of 6.58 mg/dL (H)). Liver Function Tests: Recent Labs  Lab 03/04/20 0209 03/06/20 0422 03/07/20 0234  AST 18  --   --   ALT 18  --   --   ALKPHOS 60  --   --   BILITOT 0.7  --   --   PROT 6.2*  --   --   ALBUMIN 3.3* 2.8* 2.8*   No results for input(s): LIPASE, AMYLASE in the last 168 hours. No results for input(s): AMMONIA in the last 168 hours. Coagulation Profile: Recent Labs  Lab 03/04/20 0209  INR 1.0   Cardiac Enzymes: No results for input(s): CKTOTAL, CKMB, CKMBINDEX, TROPONINI in the last 168 hours. BNP (last 3 results) No results for input(s): PROBNP in the last 8760 hours. HbA1C: No results for input(s): HGBA1C in the last 72 hours. CBG: Recent Labs  Lab 03/06/20 1211 03/06/20 1614 03/06/20 2105 03/07/20 0811 03/07/20 1140  GLUCAP 172* 162* 111* 73 87   Lipid Profile: No results for input(s): CHOL, HDL, LDLCALC, TRIG, CHOLHDL, LDLDIRECT in the last 72 hours. Thyroid Function Tests: No results for input(s): TSH, T4TOTAL, FREET4, T3FREE, THYROIDAB in the last 72 hours. Anemia Panel: No results for input(s): VITAMINB12, FOLATE, FERRITIN, TIBC, IRON, RETICCTPCT in the last 72 hours. Sepsis Labs: No results for input(s): PROCALCITON, LATICACIDVEN in the last 168 hours.  Recent Results (from the past 240 hour(s))  SARS Coronavirus 2 by RT PCR  (hospital order, performed in [facility] hospital lab) Nasopharyngeal Nasopharyngeal Swab     Status: None   Collection Time: 03/04/20  2:24 AM   Specimen: Nasopharyngeal Swab  Result Value Ref Range Status   SARS Coronavirus 2 NEGATIVE NEGATIVE Final    Comment: (NOTE) SARS-CoV-2 target nucleic acids are NOT DETECTED.  The SARS-CoV-2 RNA is generally detectable in upper and lower respiratory specimens during the acute phase of infection. The lowest concentration of SARS-CoV-2 viral copies this assay can detect is 250 copies / mL. A negative result does not preclude SARS-CoV-2 infection and should not be used as the sole basis for treatment or other patient management decisions.  A negative result may occur with improper specimen collection / handling, submission of specimen other than nasopharyngeal swab, presence of viral mutation(s) within the areas targeted by this assay, and inadequate number of viral copies (<250 copies / mL). A negative result must be combined with clinical observations, patient history, and epidemiological information.  Fact Sheet for Patients:   StrictlyIdeas.no  Fact Sheet for Healthcare Providers: BankingDealers.co.za  This test is not yet approved or  cleared by the [place] FDA and has been authorized for detection and/or diagnosis of SARS-CoV-2 by FDA under an Emergency Use Authorization (EUA).  This EUA will remain in effect (meaning this test can be used) for the duration of the COVID-19 declaration under Section 564(b)(1) of the Act, 21 U.S.C. section 360bbb-3(b)(1), unless the authorization is terminated or revoked sooner.  Performed at [facility], [place] [address], [place], [place] [address]       Radiology Studies: No results found.  Scheduled Meds: . amLODipine  5 mg Oral Daily  . hydrALAZINE  25 mg Oral Q8H  . insulin aspart  0-5 Units Subcutaneous QHS  . insulin aspart  0-6  Units Subcutaneous TID WC  . sevelamer carbonate  800 mg Oral TID WC   Continuous Infusions: . sodium chloride 75 mL/hr at 03/07/20 0932     LOS: 3 days   Time  spent: 29 minutes   Darliss Cheney, MD [facility]  03/07/2020, 2:21 PM   To contact the attending provider between 7A-7P or the covering provider during after hours 7P-7A, please log into the web site [email or web address].

## 2020-03-08 LAB — RENAL FUNCTION PANEL
Albumin: 2.7 g/dL — ABNORMAL LOW (ref 3.5–5.0)
Anion gap: 10 (ref 5–15)
BUN: 58 mg/dL — ABNORMAL HIGH (ref 8–23)
CO2: 20 mmol/L — ABNORMAL LOW (ref 22–32)
Calcium: 8.1 mg/dL — ABNORMAL LOW (ref 8.9–10.3)
Chloride: 105 mmol/L (ref 98–111)
Creatinine, Ser: 6.26 mg/dL — ABNORMAL HIGH (ref 0.61–1.24)
GFR calc Af Amer: 10 mL/min — ABNORMAL LOW (ref >60)
GFR calc non Af Amer: 9 mL/min — ABNORMAL LOW (ref >60)
Glucose, Bld: 73 mg/dL (ref 70–99)
Phosphorus: 5.8 mg/dL — ABNORMAL HIGH (ref 2.5–4.6)
Potassium: 4.3 mmol/L (ref 3.5–5.1)
Sodium: 135 mmol/L (ref 135–145)

## 2020-03-08 LAB — GLUCOSE, CAPILLARY
Glucose-Capillary: 111 mg/dL — ABNORMAL HIGH (ref 70–99)
Glucose-Capillary: 81 mg/dL (ref 70–99)

## 2020-03-08 MED ORDER — HYDRALAZINE HCL 50 MG PO TABS
50.0000 mg | ORAL_TABLET | Freq: Three times a day (TID) | ORAL | Status: DC
  Administered 2020-03-08 – 2020-03-12 (×15): 50 mg via ORAL
  Filled 2020-03-08 (×13): qty 1

## 2020-03-08 MED ORDER — SODIUM CHLORIDE 0.9 % IV SOLN: INTRAVENOUS | Status: AC

## 2020-03-08 MED ORDER — HYDRALAZINE HCL 25 MG PO TABS
12.5000 mg | ORAL_TABLET | Freq: Once | ORAL | Status: AC
  Administered 2020-03-08: 12.5 mg via ORAL
  Filled 2020-03-08: qty 1

## 2020-03-08 NOTE — Plan of Care (Signed)
  Problem: Education: Goal: Knowledge of General Education information will improve Description: Including pain rating scale, medication(s)/side effects and non-pharmacologic comfort measures Outcome: Progressing   Problem: Education: Goal: Knowledge of General Education information will improve Description: Including pain rating scale, medication(s)/side effects and non-pharmacologic comfort measures Outcome: Progressing   Problem: Education: Goal: Knowledge of General Education information will improve Description: Including pain rating scale, medication(s)/side effects and non-pharmacologic comfort measures Outcome: Progressing   Problem: Education: Goal: Knowledge of General Education information will improve Description: Including pain rating scale, medication(s)/side effects and non-pharmacologic comfort measures Outcome: Progressing   Problem: Education: Goal: Knowledge of General Education information will improve Description: Including pain rating scale, medication(s)/side effects and non-pharmacologic comfort measures Outcome: Progressing   Problem: Education: Goal: Knowledge of General Education information will improve Description: Including pain rating scale, medication(s)/side effects and non-pharmacologic comfort measures Outcome: Progressing   

## 2020-03-08 NOTE — Progress Notes (Signed)
[facility] Progress Note  Name: Benjamin [name] MRN: [id number] DOB: July 05, 1956  Chief Complaint:  Directed to ER for outpatient labs  Subjective:  Didn't have any urine output recorded over 7/2.  Has been on normal saline at 75 ml/hr.  He has outpatient appts on Tuesday and Thurs of this coming week that took a while to set up and he hopes to be able to go to these.  Feels good   Review of systems:  Denies n/v  Denies shortness of breath  Denies chest pain  He's had no difficulty with urination  ----------- Background on consult:  Benjamin [name] is a 64 y.o. male with a history of diabetes and hypertension who had presented to an outpatient clinic for evaluation of easy bruising.  He was evaluated and had labs drawn and then was sent to the ER for reported creatinine of 9.  He states that he feels well and until he understood the serious nature of the AKI he considered not coming to the ER; after talking with his provider he agreed.  He states he's been told of abnormal kidney function for a couple of years.  Denies n/v, chest pain, or shortness of breath or diarrhea.  Eating well.  His only complaint was when working outside he found his arms would bruise more easily.  Home meds include lisinopril-HCTZ (for a couple of years) and he had been on augmentin for a URI (only had 2-3 pills left of his rx). Denies NSAID use; states was told a couple of years ago not to take advil due to kidneys.  Denies family hx of ESRD.  States that his younger brother had a stroke and this made him get serious about his health a few years ago.  Has had HTN for 20 years and DM for a couple - he's not sure and may have had before detected.  He sees Dr. Humphrey Rolls at [facility] in [place].  Cr was 8.83 on presentation here and BUN 101.  Here he was given lokelma for K 5.9 and was given 1 liter of normal saline and then placed on a bicarb gtt.  Note that there is a renal ultrasound that was  obtained in January 2021 which was suggestive of medical renal disease without hydro or worrisome lesions; did have simple cyst.  Repeat renal ultrasound today had no hydro and again demonstrated increased echogenicity c/w medical renal disease.  Covid screen negative.  Chart was marked for merge but his other chart did not have lab data that I was able to locate.  He would want to avoid dialysis if possible but would do if indicated.  He's glad we're trying adjusting meds first.   Outpatient lab trends: 03/03/20 - Cr 8.3 and BUN 96, K 5.7, bicarb 13, A1c 6.6 08/28/19 - Cr 2.3, BUN 36, eGFR 29, HbA1 8.9  04/2019 Cr 1.6 01/2019 - Cr 1.6 10/20/18 - Cr 1.7 and eGFR 31, HbA1c 12    Intake/Output Summary (Last 24 hours) at 03/08/2020 0923 Last data filed at 03/08/2020 0600 Gross per 24 hour  Intake 1405.9 ml  Output --  Net 1405.9 ml    Vitals:  Vitals:   03/07/20 0937 03/07/20 1329 03/07/20 1954 03/08/20 0645  BP: (!) 146/91 (!) 150/90 (!) 158/90 (!) 146/80  Pulse: 94 92 94 95  Resp: _0 Temp: 97.6 F (36.4 C) 98 F (36.7 C) 98.2 F (36.8 C) 97.8 F (36.6 C)  TempSrc: Oral Oral Oral Oral  SpO2: 97% 94% 96% 97%  Weight:      Height:         Physical Exam:    General adult male in bed in no acute distress HEENT normocephalic atraumatic extraocular movements intact sclera anicteric; does have periorbital edema but less than prior Neck supple trachea midline Lungs clear to auscultation bilaterally normal work of breathing at rest  Heart S1S2 no rub Abdomen soft nontender nondistended Extremities no edema no cyanosis or clubbing Neuro alert and oriented x 3 provides hx and follows commands Psych normal mood and affect   Medications reviewed    Labs:  BMP Latest Ref Rng & Units 03/08/2020 03/07/2020 03/06/2020  Glucose 70 - 99 mg/dL 73 81 93  BUN 8 - 23 mg/dL 58(H) 66(H) 81(H)  Creatinine 0.61 - 1.24 mg/dL 6.26(H) 6.58(H) 7.27(H)  Sodium 135 - 145 mmol/L 135 136 136   Potassium 3.5 - 5.1 mmol/L 4.3 4.1 4.2  Chloride 98 - 111 mmol/L 105 104 101  CO2 22 - 32 mmol/L 20(L) 21(L) 22  Calcium 8.9 - 10.3 mg/dL 8.1(L) 7.8(L) 7.9(L)     Assessment/Plan:   # AKI  - Ongoing pre-renal insults with lisinopril-HCTZ and also with abx exposure with possible resultant AIN.  Note he did have CKD at baseline as below with reduced renal reserve.  UA with glucosuria and proteinuria and 0-5 RBC.  No hydro on updated renal ultrasound; up/c ratio 2110 mg/g.  Note 7% serum eos  - Strict ins/outs - discussed importance - patient reports is saving urine just not charted - Continue normal saline at 75 ml/hr x 24 hours  - Remain off of lisinopril-HCTZ  (please do not restart on discharge) - Please avoid nephrotoxins and hold nonessential meds.  Hold augmentin and would not give in the future in the event this was AIN related to augmentin - however he reports has tolerated previously   # CKD - stage IV - outpatient trends as above with Cr 2.3, eGFR 29 in 08/2019. Hx of HTN and DM predisposing and note increased echogenicity on ultrasound  - PCP is  Bertram Millard, MD at [facility] in [place]  # Proteinuria  - may be from DM.  ANCA negative.  Other work-up with ANA positive, complement normal. SPEP no M spike, UPEP no M spike, free light chain ratio slightly elevated but with CKD - would repeat free light chain ratio as an outpatient - discussed finding with pt - off of ACE due to AKI  - recommend outpt rheum eval as below  # Hyperkalemia - Hold lisinopril - resolved with temporizing measures  - renal DM diet    # Metabolic acidosis  - improved s/p bicarb gtt  - setting of AKI on CKD   # HTN  - Holding lisinopril-HCTZ  - Increase hydralazine to 50 mg TID   # Normocytic anemia   - No acute indication for ESA but may need soon - iron is replete   - CBC in AM  # Hyperphosphatemia  - continue renvela with meals for now - anticipate stopping prior  to discharge with improvement in overall trends - renal diet - improving   # ANA positive - would recommend outpatient rheum evaluation  - discussed with pt  # Reported easy bruising  - Platelets thankfully normal   Disposition - need to continue inpatient monitoring   Claudia Desanctis, MD 03/08/2020 9:42 AM

## 2020-03-08 NOTE — Progress Notes (Signed)
PROGRESS NOTE    Benjamin [name]  RKY:[id number] DOB: Jan 24, 1956 DOA: 03/03/2020 PCP: [facility]   Brief Narrative:  Patient is a 64 year old male with history of hypertension, non-insulin-dependent diabetes type 2, CKD stage IV who was referred to the ED by his PCP after he was noted to have worsening renal function on labs. Patient just had finished 10 days course of Augmentin which was prescribed to him for chest congestion.  No history of pneumonia.  On presentation he was hypertensive.  Potassium was 5.9, creatinine was 8.8.  Patient was admitted for the management of AKI on CKD.  Nephrology consulted and following.  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  Assessment & Plan:   Principal Problem:   Acute renal failure (ARF) (HCC) Active Problems:   Hyperkalemia   Metabolic acidosis   Hypertensive urgency   Anemia   AKI on CKD stage IV: Baseline creatinine around 2.3.  Referred to the ED by his PCP for the evaluation of AKI.  He had just completed 10 days course of Augmentin.  He was also taking lisinopril and hydrochlorothiazide at home. 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  Creatinine in the range of 6 today.  He is having very good urine output . continue current management.  Avoid nephrotoxins.  He should avoid Augmentin in the future. Renal ultrasound did not show any hydronephrosis but showed increased echogenicity consistent with medical renal disease.  Patient wants to avoid dialysis . Currently on gentle IV fluids at 75 ml/hr.  Metabolic acidosis has improved with bicarb drip.  He has hyperphosphatemia.  Started on Renvela. He has proteinuria.  Work-up revealed ANA positive, complements normal .Free light chain ratio  slightly elevated so nephrology recommending to repeat free light chain  ratio as an outpatient.  Also recommended to follow-up with rheumatology for positive ANA.  hyperkalemia: Given  Lokelma.  Now resolved  Hypertensive urgency: Takes hydrochlorothiazide, lisinopril at home which are on hold due to AKI.  Currently blood pressure stable.  Continue amlodipine, hydralazine  Normocytic anemia: Currently H&H stable  Easy bruising: Suspected to be from platelet dysfunction in the setting of uremia.  Continue to monitor  Diabetes mellitus type 2: Hemoglobin A1c of 6.8.  Hospital course remarkable for intermittent hypoglycemia.  Continue sliding scale  insulin.  Monitor blood sugars          DVT prophylaxis: SCD Code Status: Full code Family Communication: family member was present the bedside Status is: Inpatient  Remains inpatient appropriate because:IV treatments appropriate due to intensity of illness or inability to take PO and Inpatient level of care appropriate due to severity of illness   Dispo: The patient is from: Home              Anticipated d/c is to: Home              Anticipated d/c date is: 2-3 dys              Patient currently is not medically stable to d/c.     Consultants: Nephrology  Procedures: None  Antimicrobials:  Anti-infectives (From admission, onward)   None      Subjective: Patient seen and examined at the bedside this afternoon.  Comfortable.  Hemodynamically stable.  He says he is having very good urine output.  Denies any complaints.  He feels much better today.  Objective: Vitals:   03/07/20 1954 03/08/20 0645 03/08/20 1115 03/08/20 1155  BP: Marland Kitchen)  158/90 (!) 146/80 (!) 143/82 (!) 153/82  Pulse: 94 95 89 80  Resp: 14 16  18   Temp: 98.2 F (36.8 C) 97.8 F (36.6 C)  97.8 F (36.6 C)  TempSrc: Oral Oral  Oral  SpO2: 96% 97%  98%  Weight:      Height:        Intake/Output Summary (Last 24 hours) at 03/08/2020 1415 Last data filed at 03/08/2020 1336 Gross per 24 hour  Intake 1885.9 ml  Output 1050 ml  Net 835.9 ml   Filed Weights   03/04/20 0846  Weight: 79.4 kg    Examination:  General exam: Appears calm  and comfortable ,Not in distress,average built HEENT:PERRL,Oral mucosa moist, Ear/Nose normal on gross exam Respiratory system: Bilateral equal air entry, normal vesicular breath sounds, no wheezes or crackles  Cardiovascular system: S1 & S2 heard, RRR. No JVD, murmurs, rubs, gallops or clicks. No pedal edema. Gastrointestinal system: Abdomen is nondistended, soft and nontender. No organomegaly or masses felt. Normal bowel sounds heard. Central nervous system: Alert and oriented. No focal neurological deficits. Extremities: No edema, no clubbing ,no cyanosis, distal peripheral pulses palpable. Skin: No rashes, lesions or ulcers,no icterus ,no pallor   Data Reviewed: I have personally reviewed following labs and imaging studies  CBC: Recent Labs  Lab 03/03/20 1927 03/06/20 0422 03/07/20 0234  WBC 10.1 9.7 11.1*  NEUTROABS  --   --  7.0  HGB 10.9* 9.7* 9.5*  HCT 34.4* 29.1* 28.6*  MCV 97.7 91.2 92.3  PLT 392 342 811   Basic Metabolic Panel: Recent Labs  Lab 03/04/20 0754 03/05/20 0416 03/06/20 0422 03/07/20 0234 03/08/20 0309  NA 136 139 136 136 135  K 5.2* 4.4 4.2 4.1 4.3  CL 108 102 101 104 105  CO2 15* 22 22 21* 20*  GLUCOSE 69* 70 93 81 73  BUN 98* 91* 81* 66* 58*  CREATININE 8.65* 7.62* 7.27* 6.58* 6.26*  CALCIUM 8.0* 8.1* 7.9* 7.8* 8.1*  PHOS  --  8.3* 6.9* 6.1* 5.8*   GFR: Estimated Creatinine Clearance: 13.4 mL/min (A) (by C-G formula based on SCr of 6.26 mg/dL (H)). Liver Function Tests: Recent Labs  Lab 03/04/20 0209 03/06/20 0422 03/07/20 0234 03/08/20 0309  AST 18  --   --   --   ALT 18  --   --   --   ALKPHOS 60  --   --   --   BILITOT 0.7  --   --   --   PROT 6.2*  --   --   --   ALBUMIN 3.3* 2.8* 2.8* 2.7*   No results for input(s): LIPASE, AMYLASE in the last 168 hours. No results for input(s): AMMONIA in the last 168 hours. Coagulation Profile: Recent Labs  Lab 03/04/20 0209  INR 1.0   Cardiac Enzymes: No results for input(s):  CKTOTAL, CKMB, CKMBINDEX, TROPONINI in the last 168 hours. BNP (last 3 results) No results for input(s): PROBNP in the last 8760 hours. HbA1C: No results for input(s): HGBA1C in the last 72 hours. CBG: Recent Labs  Lab 03/07/20 0811 03/07/20 1140 03/07/20 1732 03/07/20 2101 03/08/20 1206  GLUCAP 73 87 104* 92 111*   Lipid Profile: No results for input(s): CHOL, HDL, LDLCALC, TRIG, CHOLHDL, LDLDIRECT in the last 72 hours. Thyroid Function Tests: No results for input(s): TSH, T4TOTAL, FREET4, T3FREE, THYROIDAB in the last 72 hours. Anemia Panel: No results for input(s): VITAMINB12, FOLATE, FERRITIN, TIBC, IRON, RETICCTPCT in the last 72  hours. Sepsis Labs: No results for input(s): PROCALCITON, LATICACIDVEN in the last 168 hours.  Recent Results (from the past 240 hour(s))  SARS Coronavirus 2 by RT PCR (hospital order, performed in [facility] hospital lab) Nasopharyngeal Nasopharyngeal Swab     Status: None   Collection Time: 03/04/20  2:24 AM   Specimen: Nasopharyngeal Swab  Result Value Ref Range Status   SARS Coronavirus 2 NEGATIVE NEGATIVE Final    Comment: (NOTE) SARS-CoV-2 target nucleic acids are NOT DETECTED.  The SARS-CoV-2 RNA is generally detectable in upper and lower respiratory specimens during the acute phase of infection. The lowest concentration of SARS-CoV-2 viral copies this assay can detect is 250 copies / mL. A negative result does not preclude SARS-CoV-2 infection and should not be used as the sole basis for treatment or other patient management decisions.  A negative result may occur with improper specimen collection / handling, submission of specimen other than nasopharyngeal swab, presence of viral mutation(s) within the areas targeted by this assay, and inadequate number of viral copies (<250 copies / mL). A negative result must be combined with clinical observations, patient history, and epidemiological information.  Fact Sheet for Patients:     StrictlyIdeas.no  Fact Sheet for Healthcare Providers: BankingDealers.co.za  This test is not yet approved or  cleared by the [place] FDA and has been authorized for detection and/or diagnosis of SARS-CoV-2 by FDA under an Emergency Use Authorization (EUA).  This EUA will remain in effect (meaning this test can be used) for the duration of the COVID-19 declaration under Section 564(b)(1) of the Act, 21 U.S.C. section 360bbb-3(b)(1), unless the authorization is terminated or revoked sooner.  Performed at [facility], [place] [address], [place], [place] [address]          Radiology Studies: No results found.      Scheduled Meds: . amLODipine  5 mg Oral Daily  . hydrALAZINE  50 mg Oral Q8H  . insulin aspart  0-5 Units Subcutaneous QHS  . insulin aspart  0-6 Units Subcutaneous TID WC  . sevelamer carbonate  800 mg Oral TID WC   Continuous Infusions: . sodium chloride 75 mL/hr at 03/08/20 1336     LOS: 4 days    Time spent:25 mins. More than 50% of that time was spent in counseling and/or coordination of care.      Shelly Coss, MD [facility] P7/11/2019, 2:15 PM

## 2020-03-08 NOTE — Plan of Care (Signed)

## 2020-03-09 LAB — RENAL FUNCTION PANEL
Albumin: 3 g/dL — ABNORMAL LOW (ref 3.5–5.0)
Anion gap: 13 (ref 5–15)
BUN: 53 mg/dL — ABNORMAL HIGH (ref 8–23)
CO2: 17 mmol/L — ABNORMAL LOW (ref 22–32)
Calcium: 8.3 mg/dL — ABNORMAL LOW (ref 8.9–10.3)
Chloride: 105 mmol/L (ref 98–111)
Creatinine, Ser: 6.29 mg/dL — ABNORMAL HIGH (ref 0.61–1.24)
GFR calc Af Amer: 10 mL/min — ABNORMAL LOW (ref >60)
GFR calc non Af Amer: 9 mL/min — ABNORMAL LOW (ref >60)
Glucose, Bld: 73 mg/dL (ref 70–99)
Phosphorus: 5.3 mg/dL — ABNORMAL HIGH (ref 2.5–4.6)
Potassium: 4.6 mmol/L (ref 3.5–5.1)
Sodium: 135 mmol/L (ref 135–145)

## 2020-03-09 LAB — GLUCOSE, CAPILLARY
Glucose-Capillary: 68 mg/dL — ABNORMAL LOW (ref 70–99)
Glucose-Capillary: 119 mg/dL — ABNORMAL HIGH (ref 70–99)
Glucose-Capillary: 79 mg/dL (ref 70–99)
Glucose-Capillary: 91 mg/dL (ref 70–99)

## 2020-03-09 LAB — CBC
HCT: 31.2 % — ABNORMAL LOW (ref 39.0–52.0)
Hemoglobin: 9.9 g/dL — ABNORMAL LOW (ref 13.0–17.0)
MCH: 30 pg (ref 26.0–34.0)
MCHC: 31.7 g/dL (ref 30.0–36.0)
MCV: 94.5 fL (ref 80.0–100.0)
Platelets: 307 10*3/uL (ref 150–400)
RBC: 3.3 MIL/uL — ABNORMAL LOW (ref 4.22–5.81)
RDW: 13.8 % (ref 11.5–15.5)
WBC: 14.4 10*3/uL — ABNORMAL HIGH (ref 4.0–10.5)
nRBC: 0 % (ref 0.0–0.2)

## 2020-03-09 MED ORDER — SODIUM BICARBONATE 650 MG PO TABS
650.0000 mg | ORAL_TABLET | Freq: Two times a day (BID) | ORAL | Status: DC
  Administered 2020-03-09 – 2020-03-12 (×7): 650 mg via ORAL
  Filled 2020-03-09 (×7): qty 1

## 2020-03-09 MED ORDER — SODIUM CHLORIDE 0.9 % IV SOLN: INTRAVENOUS | Status: AC

## 2020-03-09 NOTE — Progress Notes (Signed)
[facility] Progress Note  Name: Benjamin [name] MRN: [id number] DOB: 22-Jul-1956  Chief Complaint:  Directed to ER for outpatient labs  Subjective:  Had 2.9 liters UOP over 7/3.  Has been on normal saline at 75 ml/hr.  He has outpatient appts on Tuesday and Thurs of this coming week that took a while to set up and he hopes to be able to go to these.  I let him know that his numbers had stopped improving today and that the Tues appt likely wouldn't be possible.    Review of systems:   Nausea this AM no vomiting Slept ok last night  Denies shortness of breath  Denies chest pain  He's had no difficulty with urination  ----------- Background on consult:  Benjamin [name] is a 64 y.o. male with a history of diabetes and hypertension who had presented to an outpatient clinic for evaluation of easy bruising.  He was evaluated and had labs drawn and then was sent to the ER for reported creatinine of 9.  He states that he feels well and until he understood the serious nature of the AKI he considered not coming to the ER; after talking with his provider he agreed.  He states he's been told of abnormal kidney function for a couple of years.  Denies n/v, chest pain, or shortness of breath or diarrhea.  Eating well.  His only complaint was when working outside he found his arms would bruise more easily.  Home meds include lisinopril-HCTZ (for a couple of years) and he had been on augmentin for a URI (only had 2-3 pills left of his rx). Denies NSAID use; states was told a couple of years ago not to take advil due to kidneys.  Denies family hx of ESRD.  States that his younger brother had a stroke and this made him get serious about his health a few years ago.  Has had HTN for 20 years and DM for a couple - he's not sure and may have had before detected.  He sees Dr. Humphrey Rolls at [facility] in [place].  Cr was 8.83 on presentation here and BUN 101.  Here he was given lokelma for K 5.9  and was given 1 liter of normal saline and then placed on a bicarb gtt.  Note that there is a renal ultrasound that was obtained in January 2021 which was suggestive of medical renal disease without hydro or worrisome lesions; did have simple cyst.  Repeat renal ultrasound today had no hydro and again demonstrated increased echogenicity c/w medical renal disease.  Covid screen negative.  Chart was marked for merge but his other chart did not have lab data that I was able to locate.  He would want to avoid dialysis if possible but would do if indicated.  He's glad we're trying adjusting meds first.   Outpatient lab trends: 03/03/20 - Cr 8.3 and BUN 96, K 5.7, bicarb 13, A1c 6.6 08/28/19 - Cr 2.3, BUN 36, eGFR 29, HbA1 8.9  04/2019 Cr 1.6 01/2019 - Cr 1.6 10/20/18 - Cr 1.7 and eGFR 31, HbA1c 12    Intake/Output Summary (Last 24 hours) at 03/09/2020 1119 Last data filed at 03/09/2020 0900 Gross per 24 hour  Intake 2077.03 ml  Output 2850 ml  Net -772.97 ml    Vitals:  Vitals:   03/08/20 1155 03/08/20 2014 03/09/20 0555 03/09/20 0739  BP: (!) 153/82 (!) 150/91 128/72 136/76  Pulse: 80 99 98 99  Resp: '18 16 14 17  ' Temp: 97.8 F (36.6 C) 98.2 F (36.8 C) 98.3 F (36.8 C) 98.3 F (36.8 C)  TempSrc: Oral Oral Oral Oral  SpO2: 98% 94% 93% 94%  Weight:      Height:         Physical Exam:    General adult male in bed in no acute distress HEENT normocephalic atraumatic extraocular movements intact sclera anicteric; does have periorbital edema but less than prior Neck supple trachea midline Lungs clear to auscultation bilaterally normal work of breathing at rest  Heart S1S2 no rub Abdomen soft nontender nondistended Extremities no edema no cyanosis or clubbing Neuro alert and oriented x 3 provides hx and follows commands Psych normal mood and affect   Medications reviewed    Labs:  BMP Latest Ref Rng & Units 03/09/2020 03/08/2020 03/07/2020  Glucose 70 - 99 mg/dL 73 73 81  BUN 8 - 23 mg/dL  53(H) 58(H) 66(H)  Creatinine 0.61 - 1.24 mg/dL 6.29(H) 6.26(H) 6.58(H)  Sodium 135 - 145 mmol/L 135 135 136  Potassium 3.5 - 5.1 mmol/L 4.6 4.3 4.1  Chloride 98 - 111 mmol/L 105 105 104  CO2 22 - 32 mmol/L 17(L) 20(L) 21(L)  Calcium 8.9 - 10.3 mg/dL 8.3(L) 8.1(L) 7.8(L)     Assessment/Plan:   # AKI  - Ongoing pre-renal insults with lisinopril-HCTZ and also with abx exposure with possible resultant AIN.  Note he did have CKD at baseline as below with reduced renal reserve - may be reason for delayed recovery.  UA with glucosuria and proteinuria and 0-5 RBC.  No hydro on updated renal ultrasound; up/c ratio 2110 mg/g.  Note 7% serum eos; he'd been on augmentin but states had tolerated before - Continue normal saline at 100 ml/hr x 24 hours  - May need to consider renal biopsy if no further improvement  - Remain off of lisinopril-HCTZ  (please do not restart on discharge) - Please avoid nephrotoxins and hold nonessential meds.  Hold augmentin and would not give in the future in the event this was AIN related to augmentin - however he reports has tolerated previously  - Strict ins/outs are ordered  # CKD - stage IV - outpatient trends as above with Cr 2.3, eGFR 29 in 08/2019. Hx of HTN and DM predisposing and note increased echogenicity on ultrasound  - PCP is Benjamin Millard, MD at [facility] in [place]  # Proteinuria  - may be from DM.  ANCA negative.  Other work-up with ANA positive, complement normal. SPEP no M spike, UPEP no M spike, free light chain ratio slightly elevated but with CKD - would repeat free light chain ratio as an outpatient - discussed finding with pt - off of ACE due to AKI  - recommend outpt rheum eval as below  # Hyperkalemia   - Hold lisinopril - resolved with temporizing measures  - renal DM diet    # Metabolic acidosis   - improved s/p bicarb gtt  - setting of AKI on CKD  - Will start PO sodium bicarbonate 650 mg BID for now  # HTN  -  Holding lisinopril-HCTZ  - Improved after increased hydralazine to 50 mg TID   # Normocytic anemia    - No acute indication for ESA but may need soon - iron is replete    # Hyperphosphatemia  - continue renvela with meals for now - anticipate stopping prior to discharge with improvement in overall trends -  renal diet - improving   # ANA positive - would recommend outpatient rheum evaluation  - discussed with pt  # Reported easy bruising  - Platelets thankfully normal   Disposition - need to continue inpatient monitoring   Claudia Desanctis, MD 03/09/2020 11:38 AM

## 2020-03-09 NOTE — Plan of Care (Signed)

## 2020-03-09 NOTE — Progress Notes (Addendum)
PROGRESS NOTE    Benjamin [name]  ATF:[id number] DOB: [birth year] DOA: 03/03/2020 PCP: [facility]   Brief Narrative:  Patient is a 64 year old male with history of hypertension, non-insulin-dependent diabetes type 2, CKD stage IV who was referred to the ED by his PCP after he was noted to have worsening renal function on labs. Patient just had finished 10 days course of Augmentin which was prescribed to him for chest congestion.  No history of pneumonia.  On presentation he was hypertensive.  Potassium was 5.9, creatinine was 8.8.  Patient was admitted for the management of AKI on CKD.  Nephrology consulted and following.  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  Currently continue IV fluids anticipating renal recovery.  Assessment & Plan:   Principal Problem:   Acute renal failure (ARF) (HCC) Active Problems:   Hyperkalemia   Metabolic acidosis   Hypertensive urgency   Anemia   AKI on CKD stage IV: Baseline creatinine around 2.3.  Referred to the ED by his PCP for the evaluation of AKI.  He had just completed 10 days course of Augmentin.  He was also taking lisinopril and hydrochlorothiazide at home. 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  Creatinine in the range of 6 today.  He is having very good urine output . continue current management.  Avoid nephrotoxins.  He should avoid Augmentin in the future. Renal ultrasound did not show any hydronephrosis but showed increased echogenicity consistent with medical renal disease.  Patient wants to avoid dialysis . Currently on gentle IV fluids at 18ml/hr. Continue bicarb tablets for metabolic acidosis.  He has hyperphosphatemia.  Started on Renvela. He has proteinuria.  Work-up revealed ANA positive, complements normal .Free light chain ratio  slightly elevated so nephrology recommending to repeat free light chain  ratio as an outpatient.  Also recommended to follow-up with  rheumatology for positive ANA.  hyperkalemia: Given Lokelma.  Now resolved  Hypertensive urgency: Takes hydrochlorothiazide, lisinopril at home which are on hold due to AKI.  Currently blood pressure stable.  Continue amlodipine, hydralazine  Normocytic anemia: Currently H&H stable  Easy bruising: Suspected to be from platelet dysfunction in the setting of uremia.  Continue to monitor  Diabetes mellitus type 2: Hemoglobin A1c of 6.8.  Hospital course remarkable for intermittent hypoglycemia.  Continue sliding scale  insulin.  Monitor blood sugars  Leukocytosis: Most likely reactive.  No indication for antibiotic therapy.          DVT prophylaxis: SCD Code Status: Full code Family Communication: None present the bedside Status is: Inpatient  Remains inpatient appropriate because:IV treatments appropriate due to intensity of illness or inability to take PO and Inpatient level of care appropriate due to severity of illness   Dispo: The patient is from: Home              Anticipated d/c is to: Home              Anticipated d/c date is: 1-2 days              Patient currently is not medically stable to d/c.  Needs nephrology clearance before discharge.   Consultants: Nephrology  Procedures: None  Antimicrobials:  Anti-infectives (From admission, onward)   None      Subjective: Patient seen and examined at the bedside this morning.  Hemodynamically stable.  Comfortable.  No new complaints.  Objective: Vitals:   03/08/20 1155 03/08/20 2014 03/09/20 0555 03/09/20 2706  BP: (!) 153/82 (!) 150/91 128/72 136/76  Pulse: 80 99 98 99  Resp: 18 16 14 17   Temp: 97.8 F (36.6 C) 98.2 F (36.8 C) 98.3 F (36.8 C) 98.3 F (36.8 C)  TempSrc: Oral Oral Oral Oral  SpO2: 98% 94% 93% 94%  Weight:      Height:        Intake/Output Summary (Last 24 hours) at 03/09/2020 0847 Last data filed at 03/09/2020 0700 Gross per 24 hour  Intake 2317.03 ml  Output 2850 ml  Net -532.97 ml    Filed Weights   03/04/20 0846  Weight: 79.4 kg    Examination:   General exam: Appears calm and comfortable ,Not in distress,average built HEENT:PERRL,Oral mucosa moist, Ear/Nose normal on gross exam Respiratory system: Bilateral equal air entry, normal vesicular breath sounds, no wheezes or crackles  Cardiovascular system: S1 & S2 heard, RRR. No JVD, murmurs, rubs, gallops or clicks. Gastrointestinal system: Abdomen is nondistended, soft and nontender. No organomegaly or masses felt. Normal bowel sounds heard. Central nervous system: Alert and oriented. No focal neurological deficits. Extremities: No edema, no clubbing ,no cyanosis, distal peripheral pulses palpable. Skin: No  ulcers,no icterus ,no pallor     Data Reviewed: I have personally reviewed following labs and imaging studies  CBC: Recent Labs  Lab 03/03/20 1927 03/06/20 0422 03/07/20 0234 03/09/20 0641  WBC 10.1 9.7 11.1* 14.4*  NEUTROABS  --   --  7.0  --   HGB 10.9* 9.7* 9.5* 9.9*  HCT 34.4* 29.1* 28.6* 31.2*  MCV 97.7 91.2 92.3 94.5  PLT 392 342 313 829   Basic Metabolic Panel: Recent Labs  Lab 03/05/20 0416 03/06/20 0422 03/07/20 0234 03/08/20 0309 03/09/20 0641  NA 139 136 136 135 135  K 4.4 4.2 4.1 4.3 4.6  CL 102 101 104 105 105  CO2 22 22 21* 20* 17*  GLUCOSE 70 93 81 73 73  BUN 91* 81* 66* 58* 53*  CREATININE 7.62* 7.27* 6.58* 6.26* 6.29*  CALCIUM 8.1* 7.9* 7.8* 8.1* 8.3*  PHOS 8.3* 6.9* 6.1* 5.8* 5.3*   GFR: Estimated Creatinine Clearance: 13.4 mL/min (A) (by C-G formula based on SCr of 6.29 mg/dL (H)). Liver Function Tests: Recent Labs  Lab 03/04/20 0209 03/06/20 0422 03/07/20 0234 03/08/20 0309 03/09/20 0641  AST 18  --   --   --   --   ALT 18  --   --   --   --   ALKPHOS 60  --   --   --   --   BILITOT 0.7  --   --   --   --   PROT 6.2*  --   --   --   --   ALBUMIN 3.3* 2.8* 2.8* 2.7* 3.0*   No results for input(s): LIPASE, AMYLASE in the last 168 hours. No results for  input(s): AMMONIA in the last 168 hours. Coagulation Profile: Recent Labs  Lab 03/04/20 0209  INR 1.0   Cardiac Enzymes: No results for input(s): CKTOTAL, CKMB, CKMBINDEX, TROPONINI in the last 168 hours. BNP (last 3 results) No results for input(s): PROBNP in the last 8760 hours. HbA1C: No results for input(s): HGBA1C in the last 72 hours. CBG: Recent Labs  Lab 03/07/20 1732 03/07/20 2101 03/08/20 1206 03/08/20 2126 03/09/20 0740  GLUCAP 104* 92 111* 81 68*   Lipid Profile: No results for input(s): CHOL, HDL, LDLCALC, TRIG, CHOLHDL, LDLDIRECT in the last 72 hours. Thyroid Function Tests: No results for  input(s): TSH, T4TOTAL, FREET4, T3FREE, THYROIDAB in the last 72 hours. Anemia Panel: No results for input(s): VITAMINB12, FOLATE, FERRITIN, TIBC, IRON, RETICCTPCT in the last 72 hours. Sepsis Labs: No results for input(s): PROCALCITON, LATICACIDVEN in the last 168 hours.  Recent Results (from the past 240 hour(s))  SARS Coronavirus 2 by RT PCR (hospital order, performed in [facility] hospital lab) Nasopharyngeal Nasopharyngeal Swab     Status: None   Collection Time: 03/04/20  2:24 AM   Specimen: Nasopharyngeal Swab  Result Value Ref Range Status   SARS Coronavirus 2 NEGATIVE NEGATIVE Final    Comment: (NOTE) SARS-CoV-2 target nucleic acids are NOT DETECTED.  The SARS-CoV-2 RNA is generally detectable in upper and lower respiratory specimens during the acute phase of infection. The lowest concentration of SARS-CoV-2 viral copies this assay can detect is 250 copies / mL. A negative result does not preclude SARS-CoV-2 infection and should not be used as the sole basis for treatment or other patient management decisions.  A negative result may occur with improper specimen collection / handling, submission of specimen other than nasopharyngeal swab, presence of viral mutation(s) within the areas targeted by this assay, and inadequate number of viral copies (<250 copies  / mL). A negative result must be combined with clinical observations, patient history, and epidemiological information.  Fact Sheet for Patients:   StrictlyIdeas.no  Fact Sheet for Healthcare Providers: BankingDealers.co.za  This test is not yet approved or  cleared by the [place] FDA and has been authorized for detection and/or diagnosis of SARS-CoV-2 by FDA under an Emergency Use Authorization (EUA).  This EUA will remain in effect (meaning this test can be used) for the duration of the COVID-19 declaration under Section 564(b)(1) of the Act, 21 U.S.C. section 360bbb-3(b)(1), unless the authorization is terminated or revoked sooner.  Performed at [facility], [place] [address] [place], [place] [address]          Radiology Studies: No results found.      Scheduled Meds: . amLODipine  5 mg Oral Daily  . hydrALAZINE  50 mg Oral Q8H  . insulin aspart  0-5 Units Subcutaneous QHS  . insulin aspart  0-6 Units Subcutaneous TID WC  . sevelamer carbonate  800 mg Oral TID WC   Continuous Infusions: . sodium chloride 75 mL/hr at 03/09/20 0300     LOS: 5 days    Time spent:25 mins. More than 50% of that time was spent in counseling and/or coordination of care.      Shelly Coss, MD [facility] P7/12/2019, 8:47 AM

## 2020-03-10 LAB — GLUCOSE, CAPILLARY
Glucose-Capillary: 76 mg/dL (ref 70–99)
Glucose-Capillary: 64 mg/dL — ABNORMAL LOW (ref 70–99)
Glucose-Capillary: 90 mg/dL (ref 70–99)

## 2020-03-10 LAB — RENAL FUNCTION PANEL
Albumin: 2.8 g/dL — ABNORMAL LOW (ref 3.5–5.0)
Anion gap: 11 (ref 5–15)
BUN: 50 mg/dL — ABNORMAL HIGH (ref 8–23)
CO2: 20 mmol/L — ABNORMAL LOW (ref 22–32)
Calcium: 8.2 mg/dL — ABNORMAL LOW (ref 8.9–10.3)
Chloride: 105 mmol/L (ref 98–111)
Creatinine, Ser: 6.1 mg/dL — ABNORMAL HIGH (ref 0.61–1.24)
GFR calc Af Amer: 10 mL/min — ABNORMAL LOW (ref >60)
GFR calc non Af Amer: 9 mL/min — ABNORMAL LOW (ref >60)
Glucose, Bld: 83 mg/dL (ref 70–99)
Phosphorus: 5.2 mg/dL — ABNORMAL HIGH (ref 2.5–4.6)
Potassium: 4.8 mmol/L (ref 3.5–5.1)
Sodium: 136 mmol/L (ref 135–145)

## 2020-03-10 NOTE — Progress Notes (Addendum)
PROGRESS NOTE    Benjamin [name]  IDP:[id number] DOB: [birth year] DOA: 03/03/2020 PCP: [facility]   Brief Narrative:  Patient is a 64 year old male with history of hypertension, non-insulin-dependent diabetes type 2, CKD stage IV who was referred to the ED by his PCP after he was noted to have worsening renal function on labs. Patient just had finished 10 days course of Augmentin which was prescribed to him for chest congestion.  No history of pneumonia.  On presentation he was hypertensive.  Potassium was 5.9, creatinine was 8.8.  Patient was admitted for the management of AKI on CKD.  Nephrology consulted and following.  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  Currently continue IV fluids anticipating renal recovery.  Assessment & Plan:   Principal Problem:   Acute renal failure (ARF) (HCC) Active Problems:   Hyperkalemia   Metabolic acidosis   Hypertensive urgency   Anemia   AKI on CKD stage IV: Baseline creatinine around 2.3.  Referred to the ED by his PCP for the evaluation of AKI.  He had just completed 10 days course of Augmentin.  He was also taking lisinopril and hydrochlorothiazide at home. 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  Creatinine in the range of 6 today.  He is having very good urine output . continue current management.  Avoid nephrotoxins.  He should avoid Augmentin in the future. Renal ultrasound did not show any hydronephrosis but showed increased echogenicity consistent with medical renal disease.  Patient wants to avoid dialysis .  Continue bicarb tablets for metabolic acidosis.  He has hyperphosphatemia.  Started on Renvela. He has proteinuria.  Work-up revealed ANA positive, complements normal .Free light chain ratio  slightly elevated so nephrology recommending to repeat free light chain  ratio as an outpatient.  Also recommended to follow-up with rheumatology for positive ANA. Nephrology  planning to monitor for the next 24 to 48 hours, if no improvement in the kidney function, nephrology planning for consulting IR for renal biopsy.  hyperkalemia: Given Lokelma.  Now resolved  Hypertensive urgency: Takes hydrochlorothiazide, lisinopril at home which are on hold due to AKI.  Currently blood pressure stable.  Continue amlodipine, hydralazine  Normocytic anemia: Currently H&H stable.  Most likely associated  with CKD.  Easy bruising: Suspected to be from platelet dysfunction in the setting of uremia.  Continue to monitor.Stable now  Diabetes mellitus type 2: Hemoglobin A1c of 6.8.  Hospital course remarkable for intermittent hypoglycemia.  Continue sliding scale  insulin.  Monitor blood sugars  Leukocytosis: Most likely reactive.  No indication for antibiotic therapy.          DVT prophylaxis: SCD Code Status: Full code Family Communication: Wife present the bedside Status is: Inpatient  Remains inpatient appropriate because:IV treatments appropriate due to intensity of illness or inability to take PO and Inpatient level of care appropriate due to severity of illness   Dispo: The patient is from: Home              Anticipated d/c is to: Home              Anticipated d/c date is: 1-2 days              Patient currently is not medically stable to d/c.  Needs nephrology clearance before discharge.   Consultants: Nephrology  Procedures: None  Antimicrobials:  Anti-infectives (From admission, onward)   None      Subjective: Patient seen and examined  at the bedside this morning.  Hemodynamically stable.  Comfortable.  Having good urine output.  Denies any new complaints.  Objective: Vitals:   03/09/20 0739 03/09/20 1331 03/09/20 1946 03/10/20 0503  BP: 136/76 133/77 134/72 138/75  Pulse: 99 80 84 88  Resp: 17 17 16 17   Temp: 98.3 F (36.8 C) 98.2 F (36.8 C) 98.1 F (36.7 C) 98.3 F (36.8 C)  TempSrc: Oral Oral Oral Oral  SpO2: 94% 99% 98% 95%    Weight:      Height:        Intake/Output Summary (Last 24 hours) at 03/10/2020 1358 Last data filed at 03/10/2020 0400 Gross per 24 hour  Intake 1733.91 ml  Output 1300 ml  Net 433.91 ml   Filed Weights   03/04/20 0846  Weight: 79.4 kg    Examination:   General exam: Appears calm and comfortable ,Not in distress,average built HEENT:PERRL,Oral mucosa moist, Ear/Nose normal on gross exam Respiratory system: Bilateral equal air entry, normal vesicular breath sounds, no wheezes or crackles  Cardiovascular system: S1 & S2 heard, RRR. No JVD, murmurs, rubs, gallops or clicks. Gastrointestinal system: Abdomen is nondistended, soft and nontender. No organomegaly or masses felt. Normal bowel sounds heard. Central nervous system: Alert and oriented.  Extremities: No edema, no clubbing ,no cyanosis Skin: No rashes, lesions or ulcers,no icterus ,no pallor    Data Reviewed: I have personally reviewed following labs and imaging studies  CBC: Recent Labs  Lab 03/03/20 1927 03/06/20 0422 03/07/20 0234 03/09/20 0641  WBC 10.1 9.7 11.1* 14.4*  NEUTROABS  --   --  7.0  --   HGB 10.9* 9.7* 9.5* 9.9*  HCT 34.4* 29.1* 28.6* 31.2*  MCV 97.7 91.2 92.3 94.5  PLT 392 342 313 497   Basic Metabolic Panel: Recent Labs  Lab 03/06/20 0422 03/07/20 0234 03/08/20 0309 03/09/20 0641 03/10/20 0849  NA 136 136 135 135 136  K 4.2 4.1 4.3 4.6 4.8  CL 101 104 105 105 105  CO2 22 21* 20* 17* 20*  GLUCOSE 93 81 73 73 83  BUN 81* 66* 58* 53* 50*  CREATININE 7.27* 6.58* 6.26* 6.29* 6.10*  CALCIUM 7.9* 7.8* 8.1* 8.3* 8.2*  PHOS 6.9* 6.1* 5.8* 5.3* 5.2*   GFR: Estimated Creatinine Clearance: 13.8 mL/min (A) (by C-G formula based on SCr of 6.1 mg/dL (H)). Liver Function Tests: Recent Labs  Lab 03/04/20 0209 03/04/20 0209 03/06/20 0422 03/07/20 0234 03/08/20 0309 03/09/20 0641 03/10/20 0849  AST 18  --   --   --   --   --   --   ALT 18  --   --   --   --   --   --   ALKPHOS 60  --   --    --   --   --   --   BILITOT 0.7  --   --   --   --   --   --   PROT 6.2*  --   --   --   --   --   --   ALBUMIN 3.3*   < > 2.8* 2.8* 2.7* 3.0* 2.8*   < > = values in this interval not displayed.   No results for input(s): LIPASE, AMYLASE in the last 168 hours. No results for input(s): AMMONIA in the last 168 hours. Coagulation Profile: Recent Labs  Lab 03/04/20 0209  INR 1.0   Cardiac Enzymes: No results for input(s): CKTOTAL, CKMB, CKMBINDEX, TROPONINI  in the last 168 hours. BNP (last 3 results) No results for input(s): PROBNP in the last 8760 hours. HbA1C: No results for input(s): HGBA1C in the last 72 hours. CBG: Recent Labs  Lab 03/09/20 1140 03/09/20 1703 03/09/20 2028 03/10/20 0921 03/10/20 1225  GLUCAP 119* 79 91 76 64*   Lipid Profile: No results for input(s): CHOL, HDL, LDLCALC, TRIG, CHOLHDL, LDLDIRECT in the last 72 hours. Thyroid Function Tests: No results for input(s): TSH, T4TOTAL, FREET4, T3FREE, THYROIDAB in the last 72 hours. Anemia Panel: No results for input(s): VITAMINB12, FOLATE, FERRITIN, TIBC, IRON, RETICCTPCT in the last 72 hours. Sepsis Labs: No results for input(s): PROCALCITON, LATICACIDVEN in the last 168 hours.  Recent Results (from the past 240 hour(s))  SARS Coronavirus 2 by RT PCR (hospital order, performed in [facility] hospital lab) Nasopharyngeal Nasopharyngeal Swab     Status: None   Collection Time: 03/04/20  2:24 AM   Specimen: Nasopharyngeal Swab  Result Value Ref Range Status   SARS Coronavirus 2 NEGATIVE NEGATIVE Final    Comment: (NOTE) SARS-CoV-2 target nucleic acids are NOT DETECTED.  The SARS-CoV-2 RNA is generally detectable in upper and lower respiratory specimens during the acute phase of infection. The lowest concentration of SARS-CoV-2 viral copies this assay can detect is 250 copies / mL. A negative result does not preclude SARS-CoV-2 infection and should not be used as the sole basis for treatment or  other patient management decisions.  A negative result may occur with improper specimen collection / handling, submission of specimen other than nasopharyngeal swab, presence of viral mutation(s) within the areas targeted by this assay, and inadequate number of viral copies (<250 copies / mL). A negative result must be combined with clinical observations, patient history, and epidemiological information.  Fact Sheet for Patients:   StrictlyIdeas.no  Fact Sheet for Healthcare Providers: BankingDealers.co.za  This test is not yet approved or  cleared by the [place] FDA and has been authorized for detection and/or diagnosis of SARS-CoV-2 by FDA under an Emergency Use Authorization (EUA).  This EUA will remain in effect (meaning this test can be used) for the duration of the COVID-19 declaration under Section 564(b)(1) of the Act, 21 U.S.C. section 360bbb-3(b)(1), unless the authorization is terminated or revoked sooner.  Performed at [facility], [place] [address], [place], [place] [address]          Radiology Studies: No results found.      Scheduled Meds: . amLODipine  5 mg Oral Daily  . hydrALAZINE  50 mg Oral Q8H  . insulin aspart  0-6 Units Subcutaneous TID WC  . sevelamer carbonate  800 mg Oral TID WC  . sodium bicarbonate  650 mg Oral BID   Continuous Infusions:    LOS: 6 days    Time spent:25 mins. More than 50% of that time was spent in counseling and/or coordination of care.      Shelly Coss, MD [facility] P7/01/2020, 1:58 PM

## 2020-03-10 NOTE — Progress Notes (Signed)
Patient ID: Benjamin [name], male   DOB: 08/16/1956, 64 y.o.   MRN: [id number] S: No complaints but is anxious to go home as soon as possible. O:BP 138/75 (BP Location: Left Arm)   Pulse 88   Temp 98.3 F (36.8 C) (Oral)   Resp 17   Ht 6' (1.829 m)   Wt 79.4 kg   SpO2 95%   BMI 23.73 kg/m   Intake/Output Summary (Last 24 hours) at 03/10/2020 1208 Last data filed at 03/10/2020 0400 Gross per 24 hour  Intake 1973.91 ml  Output 1300 ml  Net 673.91 ml   Intake/Output: I/O last 3 completed shifts: In: 3450.9 [P.O.:960; I.V.:2490.9] Out: 3150 [Urine:3150]  Intake/Output this shift:  No intake/output data recorded. Weight change:  Gen: NAD CVS: no rub Resp: cta Abd: +BS, soft, NT/ND Ext: no edema  Recent Labs  Lab 03/04/20 0209 03/04/20 0325 03/04/20 0754 03/05/20 0416 03/06/20 0422 03/07/20 0234 03/08/20 0309 03/09/20 0641 03/10/20 0849  NA  --    < > 136 139 136 136 135 135 136  K  --    < > 5.2* 4.4 4.2 4.1 4.3 4.6 4.8  CL  --    < > 108 102 101 104 105 105 105  CO2  --    < > 15* 22 22 21* 20* 17* 20*  GLUCOSE  --    < > 69* 70 93 81 73 73 83  BUN  --    < > 98* 91* 81* 66* 58* 53* 50*  CREATININE  --    < > 8.65* 7.62* 7.27* 6.58* 6.26* 6.29* 6.10*  ALBUMIN 3.3*  --   --   --  2.8* 2.8* 2.7* 3.0* 2.8*  CALCIUM  --    < > 8.0* 8.1* 7.9* 7.8* 8.1* 8.3* 8.2*  PHOS  --   --   --  8.3* 6.9* 6.1* 5.8* 5.3* 5.2*  AST 18  --   --   --   --   --   --   --   --   ALT 18  --   --   --   --   --   --   --   --    < > = values in this interval not displayed.   Liver Function Tests: Recent Labs  Lab 03/04/20 0209 03/06/20 0422 03/08/20 0309 03/09/20 0641 03/10/20 0849  AST 18  --   --   --   --   ALT 18  --   --   --   --   ALKPHOS 60  --   --   --   --   BILITOT 0.7  --   --   --   --   PROT 6.2*  --   --   --   --   ALBUMIN 3.3*   < > 2.7* 3.0* 2.8*   < > = values in this interval not displayed.   No results for input(s): LIPASE, AMYLASE in the last 168  hours. No results for input(s): AMMONIA in the last 168 hours. CBC: Recent Labs  Lab 03/03/20 1927 03/03/20 1927 03/06/20 0422 03/07/20 0234 03/09/20 0641  WBC 10.1   < > 9.7 11.1* 14.4*  NEUTROABS  --   --   --  7.0  --   HGB 10.9*   < > 9.7* 9.5* 9.9*  HCT 34.4*   < > 29.1* 28.6* 31.2*  MCV 97.7  --  91.2  92.3 94.5  PLT 392   < > 342 313 307   < > = values in this interval not displayed.   Cardiac Enzymes: No results for input(s): CKTOTAL, CKMB, CKMBINDEX, TROPONINI in the last 168 hours. CBG: Recent Labs  Lab 03/09/20 0740 03/09/20 1140 03/09/20 1703 03/09/20 2028 03/10/20 0921  GLUCAP 68* 119* 79 91 76    Iron Studies: No results for input(s): IRON, TIBC, TRANSFERRIN, FERRITIN in the last 72 hours. Studies/Results: No results found. Marland Kitchen amLODipine  5 mg Oral Daily  . hydrALAZINE  50 mg Oral Q8H  . insulin aspart  0-6 Units Subcutaneous TID WC  . sevelamer carbonate  800 mg Oral TID WC  . sodium bicarbonate  650 mg Oral BID    BMET    Component Value Date/Time   NA 136 03/10/2020 0849   K 4.8 03/10/2020 0849   CL 105 03/10/2020 0849   CO2 20 (L) 03/10/2020 0849   GLUCOSE 83 03/10/2020 0849   BUN 50 (H) 03/10/2020 0849   CREATININE 6.10 (H) 03/10/2020 0849   CALCIUM 8.2 (L) 03/10/2020 0849   GFRNONAA 9 (L) 03/10/2020 0849   GFRAA 10 (L) 03/10/2020 0849   CBC    Component Value Date/Time   WBC 14.4 (H) 03/09/2020 0641   RBC 3.30 (L) 03/09/2020 0641   HGB 9.9 (L) 03/09/2020 0641   HCT 31.2 (L) 03/09/2020 0641   PLT 307 03/09/2020 0641   MCV 94.5 03/09/2020 0641   MCH 30.0 03/09/2020 0641   MCHC 31.7 03/09/2020 0641   RDW 13.8 03/09/2020 0641   LYMPHSABS 2.3 03/07/2020 0234   MONOABS 0.9 03/07/2020 0234   EOSABS 0.8 (H) 03/07/2020 0234   BASOSABS 0.1 03/07/2020 0234     Assessment/Plan:  1. 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  He had  been on lisinopril-hct as well as antibiotics felt to be AIN with 7% eosinophilia (was on augmentin).  He was given IVF's and abx and ace held with slowly improving renal function.  Excellent UOP. Continue with IVF's and follow Scr.  No uremic symptoms. 2. CKD stage IV- presumably due to DM and HTN.  Serologies negative.  Renal US with chronic medical renal disease, no hydro.  Will need outpatient follow up with our office after discharge. 3. Hyperkalemia- resolved off of ace 4. HTN- stable 5. Anemia of chronic kidney disease- Hgb dropping.  Will check iron stores and likely initiate ESA. 6. DM type 2- per primary   7. Disposition- hopeful discharge in next 48 hours if Scr continues to improve.  If no significant change in Scr over next 24 hours will consult IR for renal biopsy.  Donetta Potts, MD [employer] [phone]

## 2020-03-11 ENCOUNTER — Ambulatory Visit: Payer: Self-pay | Admitting: Allergy

## 2020-03-11 LAB — CBC WITH DIFFERENTIAL/PLATELET
Abs Immature Granulocytes: 0.02 10*3/uL (ref 0.00–0.07)
Basophils Absolute: 0.1 10*3/uL (ref 0.0–0.1)
Basophils Relative: 1 %
Eosinophils Absolute: 0.6 10*3/uL — ABNORMAL HIGH (ref 0.0–0.5)
Eosinophils Relative: 7 %
HCT: 27.7 % — ABNORMAL LOW (ref 39.0–52.0)
Hemoglobin: 9 g/dL — ABNORMAL LOW (ref 13.0–17.0)
Immature Granulocytes: 0 %
Lymphocytes Relative: 15 %
Lymphs Abs: 1.3 10*3/uL (ref 0.7–4.0)
MCH: 31.4 pg (ref 26.0–34.0)
MCHC: 32.5 g/dL (ref 30.0–36.0)
MCV: 96.5 fL (ref 80.0–100.0)
Monocytes Absolute: 0.6 10*3/uL (ref 0.1–1.0)
Monocytes Relative: 8 %
Neutro Abs: 5.7 10*3/uL (ref 1.7–7.7)
Neutrophils Relative %: 69 %
Platelets: 272 10*3/uL (ref 150–400)
RBC: 2.87 MIL/uL — ABNORMAL LOW (ref 4.22–5.81)
RDW: 13.7 % (ref 11.5–15.5)
WBC: 8.3 10*3/uL (ref 4.0–10.5)
nRBC: 0 % (ref 0.0–0.2)

## 2020-03-11 LAB — RENAL FUNCTION PANEL
Albumin: 2.6 g/dL — ABNORMAL LOW (ref 3.5–5.0)
Anion gap: 10 (ref 5–15)
BUN: 47 mg/dL — ABNORMAL HIGH (ref 8–23)
CO2: 20 mmol/L — ABNORMAL LOW (ref 22–32)
Calcium: 8.2 mg/dL — ABNORMAL LOW (ref 8.9–10.3)
Chloride: 106 mmol/L (ref 98–111)
Creatinine, Ser: 5.95 mg/dL — ABNORMAL HIGH (ref 0.61–1.24)
GFR calc Af Amer: 11 mL/min — ABNORMAL LOW (ref >60)
GFR calc non Af Amer: 9 mL/min — ABNORMAL LOW (ref >60)
Glucose, Bld: 76 mg/dL (ref 70–99)
Phosphorus: 5.3 mg/dL — ABNORMAL HIGH (ref 2.5–4.6)
Potassium: 4.8 mmol/L (ref 3.5–5.1)
Sodium: 136 mmol/L (ref 135–145)

## 2020-03-11 LAB — GLUCOSE, CAPILLARY
Glucose-Capillary: 47 mg/dL — ABNORMAL LOW (ref 70–99)
Glucose-Capillary: 116 mg/dL — ABNORMAL HIGH (ref 70–99)
Glucose-Capillary: 62 mg/dL — ABNORMAL LOW (ref 70–99)
Glucose-Capillary: 76 mg/dL (ref 70–99)
Glucose-Capillary: 88 mg/dL (ref 70–99)
Glucose-Capillary: 90 mg/dL (ref 70–99)

## 2020-03-11 LAB — PLATELET FUNCTION ASSAY: Collagen / Epinephrine: 139 seconds (ref 0–193)

## 2020-03-11 NOTE — Progress Notes (Signed)
PROGRESS NOTE    Benjamin [name]  POE:[id number] DOB: [birth year] DOA: 03/03/2020 PCP: [facility]   Brief Narrative:  Patient is a 64 year old male with history of hypertension, non-insulin-dependent diabetes type 2, CKD stage IV who was referred to the ED by his PCP after he was noted to have worsening renal function on labs. Patient just had finished 10 days course of Augmentin which was prescribed to him for chest congestion.  No history of pneumonia.  On presentation he was hypertensive.  Potassium was 5.9, creatinine was 8.8.  Patient was admitted for the management of AKI on CKD.  Nephrology consulted and following.  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  Nephrology planning for IR guided renal biopsy tomorrow.  Assessment & Plan:   Principal Problem:   Acute renal failure (ARF) (HCC) Active Problems:   Hyperkalemia   Metabolic acidosis   Hypertensive urgency   Anemia   AKI on CKD stage IV: Baseline creatinine around 2.3.  Referred to the ED by his PCP for the evaluation of AKI.  He had just completed 10 days course of Augmentin.  He was also taking lisinopril and hydrochlorothiazide at home. 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  Creatinine in the range of 5 today.  He is having very good urine output . Avoid nephrotoxins.  He should avoid Augmentin in the future. Renal ultrasound did not show any hydronephrosis but showed increased echogenicity consistent with medical renal disease.  Patient wants to avoid dialysis .  Continue bicarb tablets for metabolic acidosis.  He has hyperphosphatemia.  Started on Renvela. Nephrology planning for consulting IR for renal biopsy. He has proteinuria.  Work-up revealed ANA positive, complements normal .Free light chain ratio  slightly elevated so nephrology recommending to repeat free light chain  ratio as an outpatient.  Also recommended to follow-up with rheumatology for  positive ANA.  hyperkalemia: Given Lokelma.  Now resolved  Hypertensive urgency: Takes hydrochlorothiazide, lisinopril at home which are on hold due to AKI.  Currently blood pressure stable.  Continue amlodipine, hydralazine  Normocytic anemia: Currently H&H stable.  Most likely associated  with CKD.  Easy bruising: Suspected to be from platelet dysfunction in the setting of uremia.  Continue to monitor.Stable now  Diabetes mellitus type 2: Hemoglobin A1c of 6.8.  Hospital course remarkable for intermittent hypoglycemia.  Continue sliding scale  insulin.  Monitor blood sugars  Leukocytosis: Most likely reactive.  No indication for antibiotic therapy.          DVT prophylaxis: SCD Code Status: Full code Family Communication: Wife present the bedside Status is: Inpatient  Remains inpatient appropriate because:IV treatments appropriate due to intensity of illness or inability to take PO and Inpatient level of care appropriate due to severity of illness   Dispo: The patient is from: Home              Anticipated d/c is to: Home              Anticipated d/c date is: 1-2 days              Patient currently is not medically stable to d/c.  Needs nephrology clearance before discharge.   Consultants: Nephrology  Procedures: None  Antimicrobials:  Anti-infectives (From admission, onward)   None      Subjective: Patient seen and examined at the bedside this morning.  Comfortable.  Hemodynamically stable.  Very eager to go home.  Objective: Vitals:  03/10/20 1500 03/10/20 2126 03/11/20 0515 03/11/20 0819  BP: 129/80 (!) 141/83 119/77 118/77  Pulse: 91 93 87 93  Resp: 18 19 17    Temp: 98.3 F (36.8 C) 98 F (36.7 C) 98.1 F (36.7 C) (!) 97.5 F (36.4 C)  TempSrc: Oral Oral Oral Oral  SpO2: 95% 97% 92% 95%  Weight:      Height:        Intake/Output Summary (Last 24 hours) at 03/11/2020 1351 Last data filed at 03/11/2020 0730 Gross per 24 hour  Intake 480 ml    Output 850 ml  Net -370 ml   Filed Weights   03/04/20 0846  Weight: 79.4 kg    Examination:   General exam: Appears calm and comfortable ,Not in distress,average built HEENT:PERRL,Oral mucosa moist, Ear/Nose normal on gross exam Respiratory system: Bilateral equal air entry, normal vesicular breath sounds, no wheezes or crackles  Cardiovascular system: S1 & S2 heard, RRR. No JVD, murmurs, rubs, gallops or clicks. Gastrointestinal system: Abdomen is nondistended, soft and nontender. No organomegaly or masses felt. Normal bowel sounds heard. Central nervous system: Alert and oriented. No focal neurological deficits. Extremities: No edema, no clubbing ,no cyanosis, distal peripheral pulses palpable. Skin: No rashes, lesions or ulcers,no icterus ,no pallor  Data Reviewed: I have personally reviewed following labs and imaging studies  CBC: Recent Labs  Lab 03/06/20 0422 03/07/20 0234 03/09/20 0641 03/11/20 0724  WBC 9.7 11.1* 14.4* 8.3  NEUTROABS  --  7.0  --  5.7  HGB 9.7* 9.5* 9.9* 9.0*  HCT 29.1* 28.6* 31.2* 27.7*  MCV 91.2 92.3 94.5 96.5  PLT 342 313 307 952   Basic Metabolic Panel: Recent Labs  Lab 03/07/20 0234 03/08/20 0309 03/09/20 0641 03/10/20 0849 03/11/20 0724  NA 136 135 135 136 136  K 4.1 4.3 4.6 4.8 4.8  CL 104 105 105 105 106  CO2 21* 20* 17* 20* 20*  GLUCOSE 81 73 73 83 76  BUN 66* 58* 53* 50* 47*  CREATININE 6.58* 6.26* 6.29* 6.10* 5.95*  CALCIUM 7.8* 8.1* 8.3* 8.2* 8.2*  PHOS 6.1* 5.8* 5.3* 5.2* 5.3*   GFR: Estimated Creatinine Clearance: 14.1 mL/min (A) (by C-G formula based on SCr of 5.95 mg/dL (H)). Liver Function Tests: Recent Labs  Lab 03/07/20 0234 03/08/20 0309 03/09/20 0641 03/10/20 0849 03/11/20 0724  ALBUMIN 2.8* 2.7* 3.0* 2.8* 2.6*   No results for input(s): LIPASE, AMYLASE in the last 168 hours. No results for input(s): AMMONIA in the last 168 hours. Coagulation Profile: No results for input(s): INR, PROTIME in the last  168 hours. Cardiac Enzymes: No results for input(s): CKTOTAL, CKMB, CKMBINDEX, TROPONINI in the last 168 hours. BNP (last 3 results) No results for input(s): PROBNP in the last 8760 hours. HbA1C: No results for input(s): HGBA1C in the last 72 hours. CBG: Recent Labs  Lab 03/10/20 2129 03/10/20 2241 03/11/20 0730 03/11/20 0916 03/11/20 1208  GLUCAP 47* 116* 62* 76 88   Lipid Profile: No results for input(s): CHOL, HDL, LDLCALC, TRIG, CHOLHDL, LDLDIRECT in the last 72 hours. Thyroid Function Tests: No results for input(s): TSH, T4TOTAL, FREET4, T3FREE, THYROIDAB in the last 72 hours. Anemia Panel: No results for input(s): VITAMINB12, FOLATE, FERRITIN, TIBC, IRON, RETICCTPCT in the last 72 hours. Sepsis Labs: No results for input(s): PROCALCITON, LATICACIDVEN in the last 168 hours.  Recent Results (from the past 240 hour(s))  SARS Coronavirus 2 by RT PCR (hospital order, performed in [facility] hospital lab) Nasopharyngeal Nasopharyngeal Swab  Status: None   Collection Time: 03/04/20  2:24 AM   Specimen: Nasopharyngeal Swab  Result Value Ref Range Status   SARS Coronavirus 2 NEGATIVE NEGATIVE Final    Comment: (NOTE) SARS-CoV-2 target nucleic acids are NOT DETECTED.  The SARS-CoV-2 RNA is generally detectable in upper and lower respiratory specimens during the acute phase of infection. The lowest concentration of SARS-CoV-2 viral copies this assay can detect is 250 copies / mL. A negative result does not preclude SARS-CoV-2 infection and should not be used as the sole basis for treatment or other patient management decisions.  A negative result may occur with improper specimen collection / handling, submission of specimen other than nasopharyngeal swab, presence of viral mutation(s) within the areas targeted by this assay, and inadequate number of viral copies (<250 copies / mL). A negative result must be combined with clinical observations, patient history, and  epidemiological information.  Fact Sheet for Patients:   StrictlyIdeas.no  Fact Sheet for Healthcare Providers: BankingDealers.co.za  This test is not yet approved or  cleared by the [place] FDA and has been authorized for detection and/or diagnosis of SARS-CoV-2 by FDA under an Emergency Use Authorization (EUA).  This EUA will remain in effect (meaning this test can be used) for the duration of the COVID-19 declaration under Section 564(b)(1) of the Act, 21 U.S.C. section 360bbb-3(b)(1), unless the authorization is terminated or revoked sooner.  Performed at [facility], [place] [address], [place], [place] [address]          Radiology Studies: No results found.      Scheduled Meds: . amLODipine  5 mg Oral Daily  . hydrALAZINE  50 mg Oral Q8H  . insulin aspart  0-6 Units Subcutaneous TID WC  . sevelamer carbonate  800 mg Oral TID WC  . sodium bicarbonate  650 mg Oral BID   Continuous Infusions:    LOS: 7 days    Time spent:25 mins. More than 50% of that time was spent in counseling and/or coordination of care.      Shelly Coss, MD [facility] P7/02/2020, 1:51 PM

## 2020-03-11 NOTE — Progress Notes (Signed)
IR requested by Dr. Marval Regal for possible image-guided random renal biopsy.  Case/images have been reviewed by Dr. Vernard Gambles who approves procedure. Plan for image-guided random renal biopsy tentatively for tomorrow 03/12/2020 in IR. Patient will be NPO at midnight. He does not take blood thinners. INR 1.0 03/04/2020. Patient will be seen/consented tomorrow prior to procedure- attempts to see patient for this today unsuccessful as patient was off unit.  Please call IR with questions/concerns.   Bea Graff Evangelina Delancey, PA-C 03/11/2020, 4:56 PM

## 2020-03-11 NOTE — Progress Notes (Signed)
Patient ID: SILVIANO NEUSER, male   DOB: Jan 08, 1956, 64 y.o.   MRN: [id number] S: Feels well and is anxious to go home. O:BP 118/77   Pulse 93   Temp (!) 97.5 F (36.4 C) (Oral)   Resp 17   Ht 6' (1.829 m)   Wt 79.4 kg   SpO2 95%   BMI 23.73 kg/m   Intake/Output Summary (Last 24 hours) at 03/11/2020 1355 Last data filed at 03/11/2020 0730 Gross per 24 hour  Intake 480 ml  Output 850 ml  Net -370 ml   Intake/Output: I/O last 3 completed shifts: In: 1469.2 [P.O.:480; I.V.:989.2] Out: 2150 [Urine:2150]  Intake/Output this shift:  Total I/O In: 240 [P.O.:240] Out: -  Weight change:  Gen: NAD CVS: no rub Resp: cta Abd: +BS, soft, Nt/Nd Ext: no edema  Recent Labs  Lab 03/05/20 0416 03/06/20 0422 03/07/20 0234 03/08/20 0309 03/09/20 0641 03/10/20 0849 03/11/20 0724  NA 139 136 136 135 135 136 136  K 4.4 4.2 4.1 4.3 4.6 4.8 4.8  CL 102 101 104 105 105 105 106  CO2 22 22 21* 20* 17* 20* 20*  GLUCOSE 70 93 81 73 73 83 76  BUN 91* 81* 66* 58* 53* 50* 47*  CREATININE 7.62* 7.27* 6.58* 6.26* 6.29* 6.10* 5.95*  ALBUMIN  --  2.8* 2.8* 2.7* 3.0* 2.8* 2.6*  CALCIUM 8.1* 7.9* 7.8* 8.1* 8.3* 8.2* 8.2*  PHOS 8.3* 6.9* 6.1* 5.8* 5.3* 5.2* 5.3*   Liver Function Tests: Recent Labs  Lab 03/09/20 0641 03/10/20 0849 03/11/20 0724  ALBUMIN 3.0* 2.8* 2.6*   No results for input(s): LIPASE, AMYLASE in the last 168 hours. No results for input(s): AMMONIA in the last 168 hours. CBC: Recent Labs  Lab 03/06/20 0422 03/06/20 0422 03/07/20 0234 03/09/20 0641 03/11/20 0724  WBC 9.7   < > 11.1* 14.4* 8.3  NEUTROABS  --   --  7.0  --  5.7  HGB 9.7*   < > 9.5* 9.9* 9.0*  HCT 29.1*   < > 28.6* 31.2* 27.7*  MCV 91.2  --  92.3 94.5 96.5  PLT 342   < > 313 307 272   < > = values in this interval not displayed.   Cardiac Enzymes: No results for input(s): CKTOTAL, CKMB, CKMBINDEX, TROPONINI in the last 168 hours. CBG: Recent Labs  Lab 03/10/20 2129 03/10/20 2241 03/11/20 0730  03/11/20 0916 03/11/20 1208  GLUCAP 47* 116* 62* 76 88    Iron Studies: No results for input(s): IRON, TIBC, TRANSFERRIN, FERRITIN in the last 72 hours. Studies/Results: No results found. Marland Kitchen amLODipine  5 mg Oral Daily  . hydrALAZINE  50 mg Oral Q8H  . insulin aspart  0-6 Units Subcutaneous TID WC  . sevelamer carbonate  800 mg Oral TID WC  . sodium bicarbonate  650 mg Oral BID    BMET    Component Value Date/Time   NA 136 03/11/2020 0724   K 4.8 03/11/2020 0724   CL 106 03/11/2020 0724   CO2 20 (L) 03/11/2020 0724   GLUCOSE 76 03/11/2020 0724   BUN 47 (H) 03/11/2020 0724   CREATININE 5.95 (H) 03/11/2020 0724   CALCIUM 8.2 (L) 03/11/2020 0724   GFRNONAA 9 (L) 03/11/2020 0724   GFRAA 11 (L) 03/11/2020 0724   CBC    Component Value Date/Time   WBC 8.3 03/11/2020 0724   RBC 2.87 (L) 03/11/2020 0724   HGB 9.0 (L) 03/11/2020 0724   HCT 27.7 (L) 03/11/2020  0724   PLT 272 03/11/2020 0724   MCV 96.5 03/11/2020 0724   MCH 31.4 03/11/2020 0724   MCHC 32.5 03/11/2020 0724   RDW 13.7 03/11/2020 0724   LYMPHSABS 1.3 03/11/2020 0724   MONOABS 0.6 03/11/2020 0724   EOSABS 0.6 (H) 03/11/2020 0724   BASOSABS 0.1 03/11/2020 0724    Assessment/Plan:  1. 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  He was given IVF's and abx and ace held with slowly improved renal function but has reached a plateau around 6.  He was taken off of IVF's yesterday and UOP dropped to 850.  No uremic symptoms at this time, however since his renal function has not significantly improved, and that he still has 7% eosinophilia, will consult IR for renal biopsy. 2. CKD stage IV- presumably due to DM and HTN.  Serologies negative.  Renal US with chronic medical renal disease, no hydro.  Will need  outpatient follow up with our office after discharge. 3. Hyperkalemia- resolved off of ace 4. HTN- stable 5. Anemia of chronic kidney disease- Hgb dropping.  Will check iron stores and likely initiate ESA. 6. DM type 2- per primary   7. Disposition- hopeful discharge in next 24 hours if Scr continues to improve and does not have any bleeding post renal biopsy.   Donetta Potts, MD [employer] [phone]

## 2020-03-11 NOTE — Plan of Care (Signed)
  Problem: Education: Goal: Knowledge of General Education information will improve Description: Including pain rating scale, medication(s)/side effects and non-pharmacologic comfort measures Outcome: Progressing   Problem: Pain Managment: Goal: General experience of comfort will improve Outcome: Progressing   Problem: Safety: Goal: Ability to remain free from injury will improve Outcome: Progressing   

## 2020-03-11 NOTE — Plan of Care (Signed)

## 2020-03-12 ENCOUNTER — Inpatient Hospital Stay (HOSPITAL_COMMUNITY): Payer: BC Managed Care – PPO

## 2020-03-12 ENCOUNTER — Encounter (HOSPITAL_COMMUNITY): Payer: Self-pay | Admitting: Internal Medicine

## 2020-03-12 LAB — CBC WITH DIFFERENTIAL/PLATELET
Abs Immature Granulocytes: 0.01 10*3/uL (ref 0.00–0.07)
Basophils Absolute: 0.1 10*3/uL (ref 0.0–0.1)
Basophils Relative: 1 %
Eosinophils Absolute: 0.6 10*3/uL — ABNORMAL HIGH (ref 0.0–0.5)
Eosinophils Relative: 8 %
HCT: 28.7 % — ABNORMAL LOW (ref 39.0–52.0)
Hemoglobin: 9.2 g/dL — ABNORMAL LOW (ref 13.0–17.0)
Immature Granulocytes: 0 %
Lymphocytes Relative: 18 %
Lymphs Abs: 1.4 10*3/uL (ref 0.7–4.0)
MCH: 30.8 pg (ref 26.0–34.0)
MCHC: 32.1 g/dL (ref 30.0–36.0)
MCV: 96 fL (ref 80.0–100.0)
Monocytes Absolute: 0.6 10*3/uL (ref 0.1–1.0)
Monocytes Relative: 8 %
Neutro Abs: 5.1 10*3/uL (ref 1.7–7.7)
Neutrophils Relative %: 65 %
Platelets: 283 10*3/uL (ref 150–400)
RBC: 2.99 MIL/uL — ABNORMAL LOW (ref 4.22–5.81)
RDW: 13.7 % (ref 11.5–15.5)
WBC: 7.8 10*3/uL (ref 4.0–10.5)
nRBC: 0 % (ref 0.0–0.2)

## 2020-03-12 LAB — RENAL FUNCTION PANEL
Albumin: 2.6 g/dL — ABNORMAL LOW (ref 3.5–5.0)
Anion gap: 11 (ref 5–15)
BUN: 47 mg/dL — ABNORMAL HIGH (ref 8–23)
CO2: 19 mmol/L — ABNORMAL LOW (ref 22–32)
Calcium: 8.3 mg/dL — ABNORMAL LOW (ref 8.9–10.3)
Chloride: 104 mmol/L (ref 98–111)
Creatinine, Ser: 5.9 mg/dL — ABNORMAL HIGH (ref 0.61–1.24)
GFR calc Af Amer: 11 mL/min — ABNORMAL LOW (ref >60)
GFR calc non Af Amer: 9 mL/min — ABNORMAL LOW (ref >60)
Glucose, Bld: 75 mg/dL (ref 70–99)
Phosphorus: 5.2 mg/dL — ABNORMAL HIGH (ref 2.5–4.6)
Potassium: 4.6 mmol/L (ref 3.5–5.1)
Sodium: 134 mmol/L — ABNORMAL LOW (ref 135–145)

## 2020-03-12 LAB — GLUCOSE, CAPILLARY
Glucose-Capillary: 62 mg/dL — ABNORMAL LOW (ref 70–99)
Glucose-Capillary: 74 mg/dL (ref 70–99)

## 2020-03-12 MED ORDER — FENTANYL CITRATE (PF) 100 MCG/2ML IJ SOLN
INTRAMUSCULAR | Status: AC | PRN
  Administered 2020-03-12 (×2): 25 ug via INTRAVENOUS

## 2020-03-12 MED ORDER — LIDOCAINE HCL (PF) 1 % IJ SOLN
INTRAMUSCULAR | Status: AC
  Filled 2020-03-12: qty 30

## 2020-03-12 MED ORDER — FENTANYL CITRATE (PF) 100 MCG/2ML IJ SOLN
INTRAMUSCULAR | Status: AC
  Filled 2020-03-12: qty 2

## 2020-03-12 MED ORDER — GELATIN ABSORBABLE 12-7 MM EX MISC
CUTANEOUS | Status: AC
  Filled 2020-03-12: qty 1

## 2020-03-12 MED ORDER — SODIUM BICARBONATE 650 MG PO TABS: 650.0000 mg | ORAL_TABLET | Freq: Two times a day (BID) | ORAL | 1 refills | Status: DC

## 2020-03-12 MED ORDER — MIDAZOLAM HCL 2 MG/2ML IJ SOLN
INTRAMUSCULAR | Status: AC
  Filled 2020-03-12: qty 2

## 2020-03-12 MED ORDER — SEVELAMER CARBONATE 800 MG PO TABS: 800.0000 mg | ORAL_TABLET | Freq: Three times a day (TID) | ORAL | 1 refills | Status: DC

## 2020-03-12 MED ORDER — HYDRALAZINE HCL 50 MG PO TABS: 50.0000 mg | ORAL_TABLET | Freq: Three times a day (TID) | ORAL | 1 refills | Status: DC

## 2020-03-12 MED ORDER — AMLODIPINE BESYLATE 5 MG PO TABS: 5.0000 mg | ORAL_TABLET | Freq: Every day | ORAL | 1 refills | Status: DC

## 2020-03-12 MED ORDER — MIDAZOLAM HCL 2 MG/2ML IJ SOLN
INTRAMUSCULAR | Status: AC | PRN
  Administered 2020-03-12: 1 mg via INTRAVENOUS

## 2020-03-12 NOTE — Plan of Care (Signed)

## 2020-03-12 NOTE — Discharge Summary (Addendum)
Physician Discharge Summary  GRANTHAM HIPPERT MGQ:[id number] DOB: [birth year] DOA: 03/03/2020  PCP: [facility] [place] date: 03/03/2020 Discharge date: 03/12/2020  Admitted From: Home Disposition:  Home  Discharge Condition:Stable CODE STATUS:FULL Diet recommendation: Heart Healthy  Brief/Interim Summary:  Patient is a 64 year old male with history of hypertension, non-insulin-dependent diabetes type 2, CKD stage IV who was referred to the ED by his PCP after he was noted to have worsening renal function on labs. Patient just had finished 10 days course of Augmentin which was prescribed to him for chest congestion.  No history of pneumonia.  On presentation he was hypertensive.  Potassium was 5.9, creatinine was 8.8.  Patient was admitted for the management of AKI on CKD.  Nephrology consulted and following.  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    Since his kidney function did not improve, he underwent IR guided renal biopsy.  Nephrology planning to see him as an outpatient.  Nephrology cleared for discharge home today.  He is hemodynamically stable for discharge to home today.  Following problems were addressed during his hospitalization:   AKI on CKD stage IV: Baseline creatinine around 2.3.  Referred to the ED by his PCP for the evaluation of AKI.  He had just completed 10 days course of Augmentin.  He was also taking lisinopril and hydrochlorothiazide at home. 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  Creatinine in the range of 5 today.  He is having very good urine output . Avoid nephrotoxins.  He should avoid Augmentin in the future. Renal ultrasound did not show any hydronephrosis but showed increased echogenicity consistent with medical renal disease.  Patient wants to avoid dialysis . Continue bicarb tablets for metabolic acidosis.  He has hyperphosphatemia.  Started on Renvela. He has proteinuria.  Work-up revealed ANA  positive, complements normal .Free light chain ratio  slightly elevated so nephrology recommending to repeat free light chain  ratio as an outpatient.  Also recommended to follow-up with rheumatology for positive ANA. Since his kidney function did not improve, he underwent IR guided renal biopsy.  Nephrology planning to see him as an outpatient.  hyperkalemia: Given Lokelma.  Now resolved  Hypertensive urgency: Takes hydrochlorothiazide, lisinopril at home which are on hold due to AKI.  Currently blood pressure stable.  Continue amlodipine, hydralazine  Normocytic anemia: Currently H&H stable.  Most likely associated  with CKD.  Easy bruising: Suspected to be from platelet dysfunction in the setting of uremia.  Continue to monitor.Stable now  Diabetes mellitus type 2: Hemoglobin A1c of 6.8.  Hospital course remarkable for intermittent hypoglycemia.  He does not have any hypoglycemic symptoms.  He takes farxiga at home.  Given his worsening kidney function, I have recommended him to be very careful on monitoring his blood sugars at home when he is on antidiabetic  medication.  I have recommended him to follow-up with his PCP in a week.  Leukocytosis: resolved.  Discharge Diagnoses:  Principal Problem:   Acute renal failure (ARF) (HCC) Active Problems:   Hyperkalemia   Metabolic acidosis   Hypertensive urgency   Anemia    Discharge Instructions  Discharge Instructions    Diet - low sodium heart healthy   Complete by: As directed    Discharge instructions   Complete by: As directed    1)Please follow up with your PCP in a week.  Do a BMP test during the follow-up. 2)You will be called by nephrology for follow-up appointment.  3)Take prescribed medications as instructed.   Increase activity slowly   Complete by: As directed    No wound care   Complete by: As directed      Allergies as of 03/12/2020   No Active Allergies     Medication List    STOP taking these  medications   amoxicillin-clavulanate 875-125 MG tablet Commonly known as: AUGMENTIN   lisinopril-hydrochlorothiazide 10-12.5 MG tablet Commonly known as: ZESTORETIC   methylPREDNISolone 4 MG tablet Commonly known as: MEDROL     TAKE these medications   albuterol 108 (90 Base) MCG/ACT inhaler Commonly known as: VENTOLIN HFA Inhale 1-2 puffs into the lungs every 6 (six) hours as needed for shortness of breath or wheezing.   amLODipine 5 MG tablet Commonly known as: NORVASC Take 1 tablet (5 mg total) by mouth daily.   Farxiga 5 MG Tabs tablet Generic drug: dapagliflozin propanediol Take 5 mg by mouth daily.   fluticasone 50 MCG/ACT nasal spray Commonly known as: FLONASE Place 1-2 sprays into both nostrils daily as needed for allergies or rhinitis.   hydrALAZINE 50 MG tablet Commonly known as: APRESOLINE Take 1 tablet (50 mg total) by mouth every 8 (eight) hours.   sevelamer carbonate 800 MG tablet Commonly known as: RENVELA Take 1 tablet (800 mg total) by mouth 3 (three) times daily with meals.   sodium bicarbonate 650 MG tablet Take 1 tablet (650 mg total) by mouth 2 (two) times daily.       Follow-up Information    [facility]. Schedule an appointment as soon as possible for a visit in 1 week(s).   Specialty: Family Medicine Contact information: [place] [address] [phone]              No Active Allergies  Consultations:  nephrology   Procedures/Studies: US Renal  Result Date: 03/04/2020 CLINICAL DATA:  Acute kidney injury EXAM: RENAL / URINARY TRACT ULTRASOUND COMPLETE COMPARISON:  09/20/2019 FINDINGS: Right Kidney: Renal measurements: 13 x 7 x 6 cm = volume: 260 mL. Cortical echogenicity is mildly increased. No mass or hydronephrosis visualized. Left Kidney: Renal measurements: 10 x 6 x 6 cm = volume: 190 mL. Cortical echogenicity is mildly increased. No mass or hydronephrosis visualized. Bladder: Mildly  lobulated bladder which is likely from under distension. No internal debris. IMPRESSION: 1. No hydronephrosis or other interval change. 2. Prominent cortical echogenicity suggest medical renal disease. Electronically Signed   By: Monte Fantasia M.D.   On: 03/04/2020 08:13       Subjective: Patient seen and examined at the bedside this morning.  Hemodynamically stable for discharge today.  Discharge Exam: Vitals:   03/12/20 1200 03/12/20 1249  BP: 103/65 124/73  Pulse: 90 87  Resp: 16 18  Temp:  98.6 F (37 C)  SpO2: 100% 95%   Vitals:   03/12/20 1150 03/12/20 1155 03/12/20 1200 03/12/20 1249  BP: 96/61 98/60 103/65 124/73  Pulse: 88 88 90 87  Resp: 15 16 16 18   Temp:    98.6 F (37 C)  TempSrc:      SpO2: 100% 100% 100% 95%  Weight:      Height:        General: Pt is alert, awake, not in acute distress Cardiovascular: RRR, S1/S2 +, no rubs, no gallops Respiratory: CTA bilaterally, no wheezing, no rhonchi Abdominal: Soft, NT, ND, bowel sounds + Extremities: no edema, no cyanosis    The results of significant diagnostics from this hospitalization (  including imaging, microbiology, ancillary and laboratory) are listed below for reference.     Microbiology: Recent Results (from the past 240 hour(s))  SARS Coronavirus 2 by RT PCR (hospital order, performed in [facility] hospital lab) Nasopharyngeal Nasopharyngeal Swab     Status: None   Collection Time: 03/04/20  2:24 AM   Specimen: Nasopharyngeal Swab  Result Value Ref Range Status   SARS Coronavirus 2 NEGATIVE NEGATIVE Final    Comment: (NOTE) SARS-CoV-2 target nucleic acids are NOT DETECTED.  The SARS-CoV-2 RNA is generally detectable in upper and lower respiratory specimens during the acute phase of infection. The lowest concentration of SARS-CoV-2 viral copies this assay can detect is 250 copies / mL. A negative result does not preclude SARS-CoV-2 infection and should not be used as the sole basis for  treatment or other patient management decisions.  A negative result may occur with improper specimen collection / handling, submission of specimen other than nasopharyngeal swab, presence of viral mutation(s) within the areas targeted by this assay, and inadequate number of viral copies (<250 copies / mL). A negative result must be combined with clinical observations, patient history, and epidemiological information.  Fact Sheet for Patients:   StrictlyIdeas.no  Fact Sheet for Healthcare Providers: BankingDealers.co.za  This test is not yet approved or  cleared by the [place] FDA and has been authorized for detection and/or diagnosis of SARS-CoV-2 by FDA under an Emergency Use Authorization (EUA).  This EUA will remain in effect (meaning this test can be used) for the duration of the COVID-19 declaration under Section 564(b)(1) of the Act, 21 U.S.C. section 360bbb-3(b)(1), unless the authorization is terminated or revoked sooner.  Performed at [facility], [place] [address], [place], [place] [address]      Labs: BNP (last 3 results) No results for input(s): BNP in the last 8760 hours. Basic Metabolic Panel: Recent Labs  Lab 03/08/20 0309 03/09/20 0641 03/10/20 0849 03/11/20 0724 03/12/20 0433  NA 135 135 136 136 134*  K 4.3 4.6 4.8 4.8 4.6  CL 105 105 105 106 104  CO2 20* 17* 20* 20* 19*  GLUCOSE 73 73 83 76 75  BUN 58* 53* 50* 47* 47*  CREATININE 6.26* 6.29* 6.10* 5.95* 5.90*  CALCIUM 8.1* 8.3* 8.2* 8.2* 8.3*  PHOS 5.8* 5.3* 5.2* 5.3* 5.2*   Liver Function Tests: Recent Labs  Lab 03/08/20 0309 03/09/20 0641 03/10/20 0849 03/11/20 0724 03/12/20 0433  ALBUMIN 2.7* 3.0* 2.8* 2.6* 2.6*   No results for input(s): LIPASE, AMYLASE in the last 168 hours. No results for input(s): AMMONIA in the last 168 hours. CBC: Recent Labs  Lab 03/06/20 0422 03/07/20 0234 03/09/20 0641 03/11/20 0724  WBC 9.7 11.1*  14.4* 8.3  NEUTROABS  --  7.0  --  5.7  HGB 9.7* 9.5* 9.9* 9.0*  HCT 29.1* 28.6* 31.2* 27.7*  MCV 91.2 92.3 94.5 96.5  PLT 342 313 307 272   Cardiac Enzymes: No results for input(s): CKTOTAL, CKMB, CKMBINDEX, TROPONINI in the last 168 hours. BNP: Invalid input(s): POCBNP CBG: Recent Labs  Lab 03/11/20 0730 03/11/20 0916 03/11/20 1208 03/11/20 1706 03/12/20 1334  GLUCAP 62* 76 88 90 62*   D-Dimer No results for input(s): DDIMER in the last 72 hours. Hgb A1c No results for input(s): HGBA1C in the last 72 hours. Lipid Profile No results for input(s): CHOL, HDL, LDLCALC, TRIG, CHOLHDL, LDLDIRECT in the last 72 hours. Thyroid function studies No results for input(s): TSH, T4TOTAL, T3FREE, THYROIDAB in  the last 72 hours.  Invalid input(s): FREET3 Anemia work up No results for input(s): VITAMINB12, FOLATE, FERRITIN, TIBC, IRON, RETICCTPCT in the last 72 hours. Urinalysis    Component Value Date/Time   COLORURINE YELLOW 03/03/2020 2028   APPEARANCEUR HAZY (A) 03/03/2020 2028   LABSPEC 1.007 03/03/2020 2028   PHURINE 5.0 03/03/2020 2028   GLUCOSEU >=500 (A) 03/03/2020 2028   HGBUR MODERATE (A) 03/03/2020 2028   BILIRUBINUR NEGATIVE 03/03/2020 2028   KETONESUR NEGATIVE 03/03/2020 2028   PROTEINUR 100 (A) 03/03/2020 2028   NITRITE NEGATIVE 03/03/2020 2028   LEUKOCYTESUR NEGATIVE 03/03/2020 2028   Sepsis Labs Invalid input(s): PROCALCITONIN,  WBC,  LACTICIDVEN Microbiology Recent Results (from the past 240 hour(s))  SARS Coronavirus 2 by RT PCR (hospital order, performed in [place] hospital lab) Nasopharyngeal Nasopharyngeal Swab     Status: None   Collection Time: 03/04/20  2:24 AM   Specimen: Nasopharyngeal Swab  Result Value Ref Range Status   SARS Coronavirus 2 NEGATIVE NEGATIVE Final    Comment: (NOTE) SARS-CoV-2 target nucleic acids are NOT DETECTED.  The SARS-CoV-2 RNA is generally detectable in upper and lower respiratory specimens during the acute phase  of infection. The lowest concentration of SARS-CoV-2 viral copies this assay can detect is 250 copies / mL. A negative result does not preclude SARS-CoV-2 infection and should not be used as the sole basis for treatment or other patient management decisions.  A negative result may occur with improper specimen collection / handling, submission of specimen other than nasopharyngeal swab, presence of viral mutation(s) within the areas targeted by this assay, and inadequate number of viral copies (<250 copies / mL). A negative result must be combined with clinical observations, patient history, and epidemiological information.  Fact Sheet for Patients:   StrictlyIdeas.no  Fact Sheet for Healthcare Providers: BankingDealers.co.za  This test is not yet approved or  cleared by the [place] FDA and has been authorized for detection and/or diagnosis of SARS-CoV-2 by FDA under an Emergency Use Authorization (EUA).  This EUA will remain in effect (meaning this test can be used) for the duration of the COVID-19 declaration under Section 564(b)(1) of the Act, 21 U.S.C. section 360bbb-3(b)(1), unless the authorization is terminated or revoked sooner.  Performed at [facility], [place] [address], [place], [place] [address]     Please note: You were cared for by a hospitalist during your hospital stay. Once you are discharged, your primary care physician will handle any further medical issues. Please note that NO REFILLS for any discharge medications will be authorized once you are discharged, as it is imperative that you return to your primary care physician (or establish a relationship with a primary care physician if you do not have one) for your post hospital discharge needs so that they can reassess your need for medications and monitor your lab values.    Time coordinating discharge: 40 minutes  SIGNED:   Shelly Coss, MD  [facility] 03/12/2020, 2:06 PM Pager [phone]  If 7PM-7AM, please contact night-coverage [email or web address] Password [email or web address]

## 2020-03-12 NOTE — Plan of Care (Signed)
  Problem: Education: Goal: Knowledge of General Education information will improve Description: Including pain rating scale, medication(s)/side effects and non-pharmacologic comfort measures 03/12/2020 1634 by Melina Schools, RN Outcome: Adequate for Discharge 03/12/2020 0803 by Melina Schools, RN Outcome: Progressing   Problem: Health Behavior/Discharge Planning: Goal: Ability to manage health-related needs will improve 03/12/2020 1634 by Melina Schools, RN Outcome: Adequate for Discharge 03/12/2020 0803 by Melina Schools, RN Outcome: Progressing   Problem: Clinical Measurements: Goal: Ability to maintain clinical measurements within normal limits will improve Outcome: Adequate for Discharge Goal: Will remain free from infection Outcome: Adequate for Discharge Goal: Diagnostic test results will improve 03/12/2020 1634 by Melina Schools, RN Outcome: Adequate for Discharge 03/12/2020 0803 by Melina Schools, RN Outcome: Not Progressing Goal: Respiratory complications will improve Outcome: Adequate for Discharge Goal: Cardiovascular complication will be avoided Outcome: Adequate for Discharge   Problem: Activity: Goal: Risk for activity intolerance will decrease Outcome: Adequate for Discharge   Problem: Nutrition: Goal: Adequate nutrition will be maintained Outcome: Adequate for Discharge   Problem: Coping: Goal: Level of anxiety will decrease Outcome: Adequate for Discharge   Problem: Elimination: Goal: Will not experience complications related to bowel motility Outcome: Adequate for Discharge Goal: Will not experience complications related to urinary retention Outcome: Adequate for Discharge   Problem: Pain Managment: Goal: General experience of comfort will improve Outcome: Adequate for Discharge   Problem: Safety: Goal: Ability to remain free from injury will improve Outcome: Adequate for Discharge   Problem: Skin Integrity: Goal: Risk for  impaired skin integrity will decrease Outcome: Adequate for Discharge

## 2020-03-12 NOTE — Plan of Care (Signed)
  Problem: Education: Goal: Knowledge of General Education information will improve Description Including pain rating scale, medication(s)/side effects and non-pharmacologic comfort measures Outcome: Progressing   Problem: Health Behavior/Discharge Planning: Goal: Ability to manage health-related needs will improve Outcome: Progressing   

## 2020-03-12 NOTE — Procedures (Signed)
Interventional Radiology Procedure Note ° °Procedure: US Guided Biopsy of right kidney ° °Complications: None ° °Estimated Blood Loss: < 10 mL ° °Findings: °16 G core biopsy of right renal cortex performed under US guidance.  Two core samples obtained and sent to Pathology. ° °Eliodoro Gullett T. Rhyland Hinderliter, M.D °Pager:  [phone] ° °  °

## 2020-03-12 NOTE — Progress Notes (Signed)
Patient ID: Benjamin [name], male   DOB: 1955-09-22, 64 y.o.   MRN: [id number] S:Feels well, no complaints. O:BP 120/74 (BP Location: Right Arm)   Pulse 96   Temp 98.1 F (36.7 C) (Oral)   Resp 16   Ht 6' (1.829 m)   Wt 79.4 kg   SpO2 93%   BMI 23.73 kg/m  No intake or output data in the 24 hours ending 03/12/20 0850 Intake/Output: I/O last 3 completed shifts: In: 480 [P.O.:480] Out: 850 [Urine:850]  Intake/Output this shift:  No intake/output data recorded. Weight change:  Gen: NAD CVS: no rub Resp: occ exp wheezes Abd: +BS, soft, Nt/Nd Ext: trace edema  Recent Labs  Lab 03/06/20 0422 03/07/20 0234 03/08/20 0309 03/09/20 0641 03/10/20 0849 03/11/20 0724 03/12/20 0433  NA 136 136 135 135 136 136 134*  K 4.2 4.1 4.3 4.6 4.8 4.8 4.6  CL 101 104 105 105 105 106 104  CO2 22 21* 20* 17* 20* 20* 19*  GLUCOSE 93 81 73 73 83 76 75  BUN 81* 66* 58* 53* 50* 47* 47*  CREATININE 7.27* 6.58* 6.26* 6.29* 6.10* 5.95* 5.90*  ALBUMIN 2.8* 2.8* 2.7* 3.0* 2.8* 2.6* 2.6*  CALCIUM 7.9* 7.8* 8.1* 8.3* 8.2* 8.2* 8.3*  PHOS 6.9* 6.1* 5.8* 5.3* 5.2* 5.3* 5.2*   Liver Function Tests: Recent Labs  Lab 03/10/20 0849 03/11/20 0724 03/12/20 0433  ALBUMIN 2.8* 2.6* 2.6*   No results for input(s): LIPASE, AMYLASE in the last 168 hours. No results for input(s): AMMONIA in the last 168 hours. CBC: Recent Labs  Lab 03/06/20 0422 03/06/20 0422 03/07/20 0234 03/09/20 0641 03/11/20 0724  WBC 9.7   < > 11.1* 14.4* 8.3  NEUTROABS  --   --  7.0  --  5.7  HGB 9.7*   < > 9.5* 9.9* 9.0*  HCT 29.1*   < > 28.6* 31.2* 27.7*  MCV 91.2  --  92.3 94.5 96.5  PLT 342   < > 313 307 272   < > = values in this interval not displayed.   Cardiac Enzymes: No results for input(s): CKTOTAL, CKMB, CKMBINDEX, TROPONINI in the last 168 hours. CBG: Recent Labs  Lab 03/10/20 2241 03/11/20 0730 03/11/20 0916 03/11/20 1208 03/11/20 1706  GLUCAP 116* 62* 76 88 90    Iron Studies: No results for  input(s): IRON, TIBC, TRANSFERRIN, FERRITIN in the last 72 hours. Studies/Results: No results found. Marland Kitchen amLODipine  5 mg Oral Daily  . hydrALAZINE  50 mg Oral Q8H  . insulin aspart  0-6 Units Subcutaneous TID WC  . sevelamer carbonate  800 mg Oral TID WC  . sodium bicarbonate  650 mg Oral BID    BMET    Component Value Date/Time   NA 134 (L) 03/12/2020 0433   K 4.6 03/12/2020 0433   CL 104 03/12/2020 0433   CO2 19 (L) 03/12/2020 0433   GLUCOSE 75 03/12/2020 0433   BUN 47 (H) 03/12/2020 0433   CREATININE 5.90 (H) 03/12/2020 0433   CALCIUM 8.3 (L) 03/12/2020 0433   GFRNONAA 9 (L) 03/12/2020 0433   GFRAA 11 (L) 03/12/2020 0433   CBC    Component Value Date/Time   WBC 8.3 03/11/2020 0724   RBC 2.87 (L) 03/11/2020 0724   HGB 9.0 (L) 03/11/2020 0724   HCT 27.7 (L) 03/11/2020 0724   PLT 272 03/11/2020 0724   MCV 96.5 03/11/2020 0724   MCH 31.4 03/11/2020 0724   MCHC 32.5 03/11/2020 0724  RDW 13.7 03/11/2020 0724   LYMPHSABS 1.3 03/11/2020 0724   MONOABS 0.6 03/11/2020 0724   EOSABS 0.6 (H) 03/11/2020 0724   BASOSABS 0.1 03/11/2020 0724     Assessment/Plan:  1. 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 He was given IVF's and abx and ace held with slowly improved renal function but has reached a plateau around 6.  He was taken off of IVF's yesterday and UOP dropped to 850. No uremic symptoms at this time, however since his renal function has not significantly improved, and that he still has 7% eosinophilia, will consult IR for renal biopsy. 1. Plan for renal biopsy today 2. If he tolerates without complication can be discharged to home 3 hours after biopsy and f/u in our office for biopsy results. 3. Scr stable and no uremic symptoms at this time.  2. 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 Serologies negative. Renal US with chronic medical renal disease, no hydro. Will need outpatient follow up with our office after discharge. 3. Hyperkalemia- resolved off of ace 4. HTN- stable 5. Anemia of chronic kidney disease- Hgb dropping. Will check iron stores and likely initiate ESA. 6. DM type 2- per primary  7. Disposition- hopeful discharge today after renal biopsy as long as he does not have any bleeding complications.   Donetta Potts, MD [employer] [phone]

## 2020-03-12 NOTE — H&P (Signed)
Chief Complaint: Worsening renal function  Referring Physician(s): Donato Heinz  Supervising Physician: Aletta Edouard  Patient Status: Benjamin [name] - In-pt  History of Present Illness: Benjamin [name] is a 64 y.o. male with known CKD stage 3-4.  His Creatine has been climbing over the past 6 months from baseline of 1.6 to 2.3.  Recent labs showed creatinine was up to 9 and he was directed to go to ED.   He had been on lisinopril-hydrochlorothyazide as well as antibiotics.  He was given IVF's and abx and ace was held.  Renal function has slowly improvedbut reached a plateau around 6.   He was taken off of IVF's yesterday and UOP dropped to 850.   We are asked to perform a random renal biopsy.  He is NPO.  Past Medical History:  Diagnosis Date  . AKI (acute kidney injury) ([place]) 02/2020  . Diabetes mellitus without complication ([place])   . Hypertension     Past Surgical History:  Procedure Laterality Date  . WISDOM TOOTH EXTRACTION      Allergies: Patient has no active allergies.  Medications: Prior to Admission medications   Medication Sig Start Date End Date Taking? Authorizing Provider  albuterol (VENTOLIN HFA) 108 (90 Base) MCG/ACT inhaler Inhale 1-2 puffs into the lungs every 6 (six) hours as needed for shortness of breath or wheezing. 02/20/20  Yes [provider]  amoxicillin-clavulanate (AUGMENTIN) 875-125 MG tablet Take 1 tablet by mouth 2 (two) times daily. For 10 days. 02/20/20  Yes [provider]  FARXIGA 5 MG TABS tablet Take 5 mg by mouth daily. 02/06/20  Yes [provider]  fluticasone (FLONASE) 50 MCG/ACT nasal spray Place 1-2 sprays into both nostrils daily as needed for allergies or rhinitis.   Yes [provider]  lisinopril-hydrochlorothiazide (ZESTORETIC) 10-12.5 MG tablet Take 1 tablet by mouth daily. 02/27/20  Yes [provider]  amLODipine (NORVASC) 5 MG tablet Take 5 mg by mouth daily. 03/03/20    [provider]  methylPREDNISolone (MEDROL) 4 MG tablet Take 4 mg by mouth as directed. Patient not taking: Reported on 03/04/2020 02/20/20   [provider]     History reviewed. No pertinent family history.  Social History   Socioeconomic History  . Marital status: Married    Spouse name: Not on file  . Number of children: Not on file  . Years of education: Not on file  . Highest education level: Not on file  Occupational History  . Not on file  Tobacco Use  . Smoking status: Never Smoker  . Smokeless tobacco: Never Used  Vaping Use  . Vaping Use: Never used  Substance and Sexual Activity  . Alcohol use: Never  . Drug use: Never  . Sexual activity: Not on file  Other Topics Concern  . Not on file  Social History Narrative  . Not on file   Social Determinants of Health   Financial Resource Strain:   . Difficulty of Paying Living Expenses:   Food Insecurity:   . Worried About Charity fundraiser in the Last Year:   . Arboriculturist in the Last Year:   Transportation Needs:   . Film/video editor (Medical):   Marland Kitchen Lack of Transportation (Non-Medical):   Physical Activity:   . Days of Exercise per Week:   . Minutes of Exercise per Session:   Stress:   . Feeling of Stress :   Social Connections:   . Frequency  of Communication with Friends and Family:   . Frequency of Social Gatherings with Friends and Family:   . Attends Religious Services:   . Active Member of Clubs or Organizations:   . Attends Archivist Meetings:   Marland Kitchen Marital Status:      Review of Systems: A 12 point ROS discussed and pertinent positives are indicated in the HPI above.  All other systems are negative.  Review of Systems  Vital Signs: BP 135/73   Pulse 89   Temp 98 F (36.7 C) (Oral)   Resp 16   Ht 6' (1.829 m)   Wt 79.4 kg   SpO2 98%   BMI 23.73 kg/m   Physical Exam Vitals reviewed.  Constitutional:      Appearance: Normal appearance.  HENT:       Head: Normocephalic and atraumatic.  Eyes:     Extraocular Movements: Extraocular movements intact.  Neck:     Comments: Left neck swelling, erythema, tenderness Cardiovascular:     Rate and Rhythm: Normal rate and regular rhythm.  Pulmonary:     Effort: Pulmonary effort is normal. No respiratory distress.     Breath sounds: Normal breath sounds.  Abdominal:     General: There is no distension.     Palpations: Abdomen is soft.     Tenderness: There is no abdominal tenderness.  Musculoskeletal:        General: Normal range of motion.  Skin:    General: Skin is warm and dry.  Neurological:     General: No focal deficit present.     Mental Status: He is alert and oriented to person, place, and time.  Psychiatric:        Mood and Affect: Mood normal.        Behavior: Behavior normal.        Thought Content: Thought content normal.        Judgment: Judgment normal.     Imaging: US Renal  Result Date: 03/04/2020 CLINICAL DATA:  Acute kidney injury EXAM: RENAL / URINARY TRACT ULTRASOUND COMPLETE COMPARISON:  09/20/2019 FINDINGS: Right Kidney: Renal measurements: 13 x 7 x 6 cm = volume: 260 mL. Cortical echogenicity is mildly increased. No mass or hydronephrosis visualized. Left Kidney: Renal measurements: 10 x 6 x 6 cm = volume: 190 mL. Cortical echogenicity is mildly increased. No mass or hydronephrosis visualized. Bladder: Mildly lobulated bladder which is likely from under distension. No internal debris. IMPRESSION: 1. No hydronephrosis or other interval change. 2. Prominent cortical echogenicity suggest medical renal disease. Electronically Signed   By: Monte Fantasia M.D.   On: 03/04/2020 08:13    Labs:  CBC: Recent Labs    03/06/20 0422 03/07/20 0234 03/09/20 0641 03/11/20 0724  WBC 9.7 11.1* 14.4* 8.3  HGB 9.7* 9.5* 9.9* 9.0*  HCT 29.1* 28.6* 31.2* 27.7*  PLT 342 313 307 272    COAGS: Recent Labs    03/04/20 0209  INR 1.0    BMP: Recent Labs     03/09/20 0641 03/10/20 0849 03/11/20 0724 03/12/20 0433  NA 135 136 136 134*  K 4.6 4.8 4.8 4.6  CL 105 105 106 104  CO2 17* 20* 20* 19*  GLUCOSE 73 83 76 75  BUN 53* 50* 47* 47*  CALCIUM 8.3* 8.2* 8.2* 8.3*  CREATININE 6.29* 6.10* 5.95* 5.90*  GFRNONAA 9* 9* 9* 9*  GFRAA 10* 10* 11* 11*    LIVER FUNCTION TESTS: Recent Labs    03/04/20 0209 03/06/20  0422 03/09/20 6659 03/10/20 0849 03/11/20 0724 03/12/20 0433  BILITOT 0.7  --   --   --   --   --   AST 18  --   --   --   --   --   ALT 18  --   --   --   --   --   ALKPHOS 60  --   --   --   --   --   PROT 6.2*  --   --   --   --   --   ALBUMIN 3.3*   < > 3.0* 2.8* 2.6* 2.6*   < > = values in this interval not displayed.    TUMOR MARKERS: No results for input(s): AFPTM, CEA, CA199, CHROMGRNA in the last 8760 hours.  Assessment and Plan:  Worsening renal function   Will proceed with image guided random renal biopsy today by Dr. Kathlene Cote.  Risks and benefits of random renal biopsy was discussed with the patient and/or patient's family including, but not limited to bleeding, infection, damage to adjacent structures or low yield requiring additional tests.  All of the questions were answered and there is agreement to proceed.  Consent signed and in chart.  Thank you for this interesting consult.  I greatly enjoyed meeting Benjamin [name] and look forward to participating in their care.  A copy of this report was sent to the requesting provider on this date.  Electronically Signed: Murrell Redden, PA-C   03/12/2020, 9:48 AM      I spent a total of 20 Minutes in face to face in clinical consultation, greater than 50% of which was counseling/coordinating care for random renal biopsy.

## 2020-03-12 NOTE — Progress Notes (Signed)
MEWS at 1150 and 1155 patient is off the unit for a procedure.

## 2020-03-13 LAB — ANTISTREPTOLYSIN O TITER: ASO: 29 IU/mL (ref 0.0–200.0)

## 2020-03-13 LAB — ANTI-DNA ANTIBODY, DOUBLE-STRANDED: ds DNA Ab: <1 IU/mL (ref 0–9)

## 2020-03-18 ENCOUNTER — Encounter: Payer: Self-pay | Admitting: Allergy

## 2020-03-18 ENCOUNTER — Other Ambulatory Visit: Payer: Self-pay

## 2020-03-18 ENCOUNTER — Ambulatory Visit (INDEPENDENT_AMBULATORY_CARE_PROVIDER_SITE_OTHER): Payer: BC Managed Care – PPO | Admitting: Allergy

## 2020-03-18 VITALS — BP 122/72 | HR 90 | Resp 18 | Ht 69.0 in | Wt 201.2 lb

## 2020-03-18 DIAGNOSIS — J301 Allergic rhinitis due to pollen: Secondary | ICD-10-CM

## 2020-03-18 DIAGNOSIS — J4530 Mild persistent asthma, uncomplicated: Secondary | ICD-10-CM | POA: Diagnosis not present

## 2020-03-18 DIAGNOSIS — H1013 Acute atopic conjunctivitis, bilateral: Secondary | ICD-10-CM

## 2020-03-18 MED ORDER — OLOPATADINE HCL 0.2 % OP SOLN: OPHTHALMIC | 5 refills | Status: DC

## 2020-03-18 MED ORDER — LEVALBUTEROL TARTRATE 45 MCG/ACT IN AERO: INHALATION_SPRAY | RESPIRATORY_TRACT | 1 refills | Status: DC

## 2020-03-18 MED ORDER — MONTELUKAST SODIUM 10 MG PO TABS
10.0000 mg | ORAL_TABLET | Freq: Every day | ORAL | 5 refills | Status: DC
Start: 2020-03-18 — End: 2021-02-24

## 2020-03-18 MED ORDER — AZELASTINE HCL 0.1 % NA SOLN: NASAL | 5 refills | Status: DC

## 2020-03-18 NOTE — Progress Notes (Signed)
New Patient Note  RE: Benjamin [name] MRN: [id number] DOB: 18-Feb-1956 Date of Office Visit: 03/18/2020  Referring provider: No ref. provider found Primary care provider: Mateo Flow, MD  Chief Complaint: runny nose and eyes  History of present illness: Benjamin [name] is a 64 y.o. male presenting today for consultation for allergies.   He has been having nasal drainage and watery eyes.  He states he has nasal drainage to the point that he has to carry around tissues.  He states he has tried all over-the-counter medications and nothing has helped.  However he has never tried any eye drops.  He states he has had tried Claritin, Allegra, Xyzal, Zyrtec without much relief.  Same with the over-the-counter nasal sprays. He reports symptoms are year-round.  He does perform saline rinses monthly and feels like it hasn't been helpful either.    He has an albuterol inhaler and states was prescribed for 'congestion and breathing'.  He states at one point he did have a CXR for congestion and states it wasn't normal but was not a PNA.   He states he has been using the albuterol in the last few days as he states being hospitalized recently "set off" his difficulty in breathing.  He is noticing wheezing as well as some shortness of breath and cough.  The albuterol does help.  He states he was not doing any incentive spirometry while hospitalized. He states about 30 years ago he did see an allergist and states it was discussed he could have asthma.   No eczema or food allergy.   [place] that he remodeled when he moved in it.  He states there was mold in the barn prior to remodel and is unsure if he got all of it.  He was recently hospitalized from 6/28 to 03/12/2020 due to acute renal failure.  He has CKD stage IV and he was referred to the ED by his PCP after noticing worsening renal function on labs.  He had just finished a 10-day course of Augmentin for chest congestion.   When he presented to the ED he was hypertensive and had a potassium level of 5.9 and a creatinine of 8.8.  He was suspected to have AIN secondary to Augmentin use.  He did undergo a renal biopsy.  He is seeing nephrology as an outpatient.  He was recommended to avoid Augmentin in the future.  He was started on new medications to help with hyperphosphatemia and his kidney disease.  He was also found to have a positive ANA and also will follow with rheumatology.   Review of systems: Review of Systems  Constitutional: Negative.   HENT:       See HPI  Eyes:       See HPI  Respiratory: Positive for cough, shortness of breath and wheezing.   Cardiovascular: Negative.   Gastrointestinal: Negative.   Musculoskeletal: Negative.   Skin: Negative.   Neurological: Negative.     All other systems negative unless noted above in HPI  Past medical history: Past Medical History:  Diagnosis Date  . AKI (acute kidney injury) ([place]) 02/2020  . Diabetes mellitus without complication ([place])   . Hypertension     Past surgical history: Past Surgical History:  Procedure Laterality Date  . WISDOM TOOTH EXTRACTION      Family history:  Family History  Problem Relation Age of Onset  . Stroke Mother   . Stroke  Father     Social history: Lives in a barn as above without carpeting with electric heating and central cooling.  2 dogs and 1 cat in the home.  There are horses outside the home.  There is no concern for roaches in the home.  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  He denies a smoking history.  Medication List: Current Outpatient Medications  Medication Sig Dispense Refill  . albuterol (VENTOLIN HFA) 108 (90 Base) MCG/ACT inhaler Inhale 1-2 puffs into the lungs every 6 (six) hours as needed for shortness of breath or wheezing.    . hydrALAZINE (APRESOLINE) 50 MG tablet Take 1 tablet (50 mg total) by mouth every 8 (eight) hours. 90 tablet 1  . sevelamer carbonate (RENVELA) 800 MG tablet Take 1  tablet (800 mg total) by mouth 3 (three) times daily with meals. 90 tablet 1  . sodium bicarbonate 650 MG tablet Take 1 tablet (650 mg total) by mouth 2 (two) times daily. 60 tablet 1  . azelastine (ASTELIN) 0.1 % nasal spray Use two sprays in each nostril twice daily as directed. 30 mL 5  . levalbuterol (XOPENEX HFA) 45 MCG/ACT inhaler Can inhale two puffs every four to six hours as needed for cough or wheeze. 15 g 1  . montelukast (SINGULAIR) 10 MG tablet Take 1 tablet (10 mg total) by mouth at bedtime. 30 tablet 5  . Olopatadine HCl 0.2 % SOLN Can use one drop in each eye once daily if needed for red, itchy, watery eyes. 2.5 mL 5   No current facility-administered medications for this visit.    Known medication allergies: No Known Allergies   Physical examination: Blood pressure 122/72, pulse 90, resp. rate 18, height 5\' 9"  (1.753 m), weight 201 lb 3.2 oz (91.3 kg), SpO2 96 %.  General: Alert, interactive, in no acute distress. HEENT: PERRLA, TMs pearly gray, turbinates mildly edematous with clear discharge, post-pharynx non erythematous. Neck: Supple without lymphadenopathy. Lungs: Clear to auscultation without wheezing, rhonchi or rales. {no increased work of breathing. CV: Normal S1, S2 without murmurs. Abdomen: Nondistended, nontender. Skin: There is a scabbed abrasion on his right forearm. Extremities:  No clubbing, cyanosis or edema. Neuro:   Grossly intact.  Diagnositics/Labs: Albuterol use was performed around 6:30 AM today Spirometry: FEV1: 1.66 L 49%, FVC: 2.57 L 57% predicted.  Status post Xopenex use improvement in FEV1 to 2.01 L or 59% which is a 21% increase which is significant.  FVC also improved to 3.03 L or 67% predicted  Allergy testing: Environmental allergy skin prick testing was positive to meadow fescue, short ragweed, rough pigweed, ash mix.  Intradermal testing is negative. Allergy testing results were read and interpreted by provider, documented by  clinical staff.   Assessment and plan: Allergic rhinitis with conjunctivitis  - environmental allergy testing today is positive to grass pollen, tree pollen, weed pollen   - allergen avoidance measures discussed/handouts provided  - start nasal antihistamine, Astelin 2 sprays each nostril twice a day.  This spray helps manage nasal drainage.  If nose gets to dry then decrease use  - for watery or itchy eyes use Pataday or Pataday Xtra Strength 1 drop each eye daily as needed  - start Montelukast 10mg  daily at bedtime.  This is not an antihistamine but can work along-side antihistamines for improved allergy symptom control as well as breathing symptom control.  If you notice any change in mood/behavior/sleep after starting Singulair then stop this medication and let  us know.  Symptoms resolve after stopping the medication.    Reactive airway  - have access to Xopenex inhaler 2 puffs every 4-6 hours as needed for cough/wheeze/shortness of breath/chest tightness.  May use 15-20 minutes prior to activity.   Monitor frequency of use.    - Xopenex is similar to albuterol but has less side effects.  You have a big response in lung function after use of Xopenex thus will provide with prescription  - Montelukast as above.  This should help improve your symptoms with your breathing: wheezing, cough, shortness of breath, chest tightness.  Control goals:   Full participation in all desired activities (may need albuterol before activity)  Albuterol use two time or less a week on average (not counting use with activity)  Cough interfering with sleep two time or less a month  Oral steroids no more than once a year  No hospitalizations  Follow-up in 3 months or sooner if needed  I appreciate the opportunity to take part in Kingjames's care. Please do not hesitate to contact me with questions.  Sincerely,   Prudy Feeler, MD Allergy/Immunology [facility] [place] [facility] [place]

## 2020-03-18 NOTE — Patient Instructions (Addendum)
Allergies  - environmental allergy testing today is positive to grass pollen, tree pollen, weed pollen   - allergen avoidance measures discussed/handouts provided  - start nasal antihistamine, Astelin 2 sprays each nostril twice a day.  This spray helps manage nasal drainage.  If nose gets to dry then decrease use  - for watery or itchy eyes use Pataday or Pataday Xtra Strength 1 drop each eye daily as needed  - start Montelukast 10mg  daily at bedtime.  This is not an antihistamine but can work along-side antihistamines for improved allergy symptom control as well as breathing symptom control.  If you notice any change in mood/behavior/sleep after starting Singulair then stop this medication and let us know.  Symptoms resolve after stopping the medication.    Reactive airway  - have access to Xopenex inhaler 2 puffs every 4-6 hours as needed for cough/wheeze/shortness of breath/chest tightness.  May use 15-20 minutes prior to activity.   Monitor frequency of use.    - Xopenex is similar to albuterol but has less side effects.  You have a big response in lung function after use of Xopenex thus will provide with prescription  - Montelukast as above.  This should help improve your symptoms with your breathing: wheezing, cough, shortness of breath, chest tightness.  Control goals:   Full participation in all desired activities (may need albuterol before activity)  Albuterol use two time or less a week on average (not counting use with activity)  Cough interfering with sleep two time or less a month  Oral steroids no more than once a year  No hospitalizations  Follow-up in 3 months or sooner if needed

## 2020-03-19 DIAGNOSIS — E1169 Type 2 diabetes mellitus with other specified complication: Secondary | ICD-10-CM | POA: Diagnosis not present

## 2020-03-19 DIAGNOSIS — N183 Chronic kidney disease, stage 3 unspecified: Secondary | ICD-10-CM | POA: Diagnosis not present

## 2020-03-19 DIAGNOSIS — D638 Anemia in other chronic diseases classified elsewhere: Secondary | ICD-10-CM | POA: Diagnosis not present

## 2020-03-19 DIAGNOSIS — N179 Acute kidney failure, unspecified: Secondary | ICD-10-CM | POA: Diagnosis not present

## 2020-03-20 DIAGNOSIS — R809 Proteinuria, unspecified: Secondary | ICD-10-CM | POA: Diagnosis not present

## 2020-03-20 DIAGNOSIS — N179 Acute kidney failure, unspecified: Secondary | ICD-10-CM | POA: Diagnosis not present

## 2020-03-20 DIAGNOSIS — N184 Chronic kidney disease, stage 4 (severe): Secondary | ICD-10-CM | POA: Diagnosis not present

## 2020-03-20 DIAGNOSIS — I129 Hypertensive chronic kidney disease with stage 1 through stage 4 chronic kidney disease, or unspecified chronic kidney disease: Secondary | ICD-10-CM | POA: Diagnosis not present

## 2020-03-20 DIAGNOSIS — D631 Anemia in chronic kidney disease: Secondary | ICD-10-CM | POA: Diagnosis not present

## 2020-03-26 ENCOUNTER — Other Ambulatory Visit: Payer: Self-pay

## 2020-03-26 ENCOUNTER — Telehealth: Payer: Self-pay

## 2020-03-26 MED ORDER — ALBUTEROL SULFATE HFA 108 (90 BASE) MCG/ACT IN AERS: INHALATION_SPRAY | RESPIRATORY_TRACT | 1 refills | Status: DC

## 2020-03-26 NOTE — Telephone Encounter (Signed)
Called and let patient know that his insurance denied the Xopenex HFA, and per Dr. Nelva Bush he can use his Albuterol HFA as needed in place of the [place].  Patient requested that we send in new RX for the Albuterol.  [place] sent to [facility].

## 2020-03-26 NOTE — Telephone Encounter (Signed)
Called and informed patient that insurance denied the Xopenex inhaler, so per Dr. Nelva Bush he can just use the Albuterol HFA if needed. He wants [place] to send in new prescription for Albuterol HFA to [facility].  [place] sent.

## 2020-03-27 ENCOUNTER — Encounter (HOSPITAL_COMMUNITY): Payer: Self-pay | Admitting: Nephrology

## 2020-03-27 LAB — SURGICAL PATHOLOGY

## 2020-03-28 ENCOUNTER — Telehealth: Payer: Self-pay | Admitting: Hematology

## 2020-03-28 NOTE — Telephone Encounter (Signed)
Received a new hem referral from Dr. Royce Macadamia at [facility] for evaluation of elevated serum free light chain ratio. Benjamin [name] has been cld and scheduled to see Dr. Irene Limbo on 8/5 at 1pm. Pt aware to arrive 15 minutes early. Letter mailed.

## 2020-04-01 ENCOUNTER — Ambulatory Visit: Payer: Self-pay | Admitting: Allergy

## 2020-04-08 ENCOUNTER — Encounter (HOSPITAL_COMMUNITY): Payer: BC Managed Care – PPO

## 2020-04-08 DIAGNOSIS — E11311 Type 2 diabetes mellitus with unspecified diabetic retinopathy with macular edema: Secondary | ICD-10-CM | POA: Diagnosis not present

## 2020-04-09 NOTE — Progress Notes (Signed)
HEMATOLOGY/ONCOLOGY CONSULTATION NOTE  Date of Service: 04/10/2020  Patient Care Team: Mateo Flow, MD as PCP - General (Family Medicine)  REFERRING PHYSICIAN: Mateo Flow, MD  CHIEF COMPLAINTS/PURPOSE OF CONSULTATION:  Evaluation of elevated serum free light chain ratio  HISTORY OF PRESENTING ILLNESS:   Benjamin [name] is a wonderful 63 y.o. male who has been referred to [place] by Mateo Flow, MD for evaluation and management of Evaluation of elevated serum free light chain ratio. Pt is accompanied today by Butch Penny. The pt reports that he is doing well overall.   The pt reports he is good. Pt has had Kidney dysfunction for a while. Between January and June the pt's creatinine became abnormal.  The nephrologist had assumed the reaction was due to the medications he was on. Over the last year his diabetes and blood pressure has been much better controlled. The pt lost about 60 pounds over the last year.   His energy level has been low and the pt has a lot of extra fluid. The pt's legs have been feeling very weak. Pt is often cold. He is having to go to a retina specialist at the end of the month for pressure on his retinas due to his blood pressure.   Most recent lab results (03/20/20) of CBC is as follows: all values are WNL except for BUN at 40, Creatinine at 5.06, GFR Non Africn Am at 11, GFR Africn Am at 13, Sodium at 133, Calcium at 8.4, Phosphorus at 4.4, Albumin at 3.6 03/20/20 of Free Light Chain Serum is as follows: all values are WNL except for Free Kappa Lt Chains at 106.5, Free Lambda Lt Chains at 38.7, Kappa/Lambda Ratios at 2.75 03/20/20 of Hemoglobin at 8.8 03/20/20 of Magnesium at 2: WNL 03/20/20 of Intact PTH at 124 03/20/20 of 25 Hydroxy Vit D at 15.3  On review of systems, pt reports fatigue, fluid retention, healthy appetite, sleeping well and denies bleeding issues, abdominal pain, changes in bowel habits and any other symptoms.   On PMHx the pt reports  hypertension, diabetes, CKD, allergies  On Social Hx the pt reports no smoking, pt chews smokeless tobacco, no alcohol for the past 2 years, pt works in Transport planner   On Family Hx the pt reports his younger brother had a stroke  MEDICAL HISTORY:  Past Medical History:  Diagnosis Date  . AKI (acute kidney injury) ([place]) 02/2020  . Diabetes mellitus without complication ([place])   . Hypertension      SURGICAL HISTORY: Past Surgical History:  Procedure Laterality Date  . WISDOM TOOTH EXTRACTION       SOCIAL HISTORY: Social History   Socioeconomic History  . Marital status: Married    Spouse name: Not on file  . Number of children: Not on file  . Years of education: Not on file  . Highest education level: Not on file  Occupational History  . Not on file  Tobacco Use  . Smoking status: Never Smoker  . Smokeless tobacco: Current User    Types: Chew  Vaping Use  . Vaping Use: Never used  Substance and Sexual Activity  . Alcohol use: Not Currently  . Drug use: Never  . Sexual activity: Not on file  Other Topics Concern  . Not on file  Social History Narrative  . Not on file   Social Determinants of Health   Financial Resource Strain:   . Difficulty of Paying Living Expenses:   Food Insecurity:   .  Worried About Charity fundraiser in the Last Year:   . Arboriculturist in the Last Year:   Transportation Needs:   . Film/video editor (Medical):   Marland Kitchen Lack of Transportation (Non-Medical):   Physical Activity:   . Days of Exercise per Week:   . Minutes of Exercise per Session:   Stress:   . Feeling of Stress :   Social Connections:   . Frequency of Communication with Friends and Family:   . Frequency of Social Gatherings with Friends and Family:   . Attends Religious Services:   . Active Member of Clubs or Organizations:   . Attends Archivist Meetings:   Marland Kitchen Marital Status:   Intimate Partner Violence:   . Fear of Current or Ex-Partner:   .  Emotionally Abused:   Marland Kitchen Physically Abused:   . Sexually Abused:      FAMILY HISTORY: Family History  Problem Relation Age of Onset  . Stroke Mother   . Stroke Father      ALLERGIES:   is allergic to grass pollen([id number] vern).   MEDICATIONS:  Current Outpatient Medications  Medication Sig Dispense Refill  . albuterol (VENTOLIN HFA) 108 (90 Base) MCG/ACT inhaler Can inhale two puffs every four to six hours as needed for cough, wheeze, shortness of breath, or chest tightness. 8.5 g 1  . azelastine (ASTELIN) 0.1 % nasal spray Use two sprays in each nostril twice daily as directed. 30 mL 5  . hydrALAZINE (APRESOLINE) 50 MG tablet Take 1 tablet (50 mg total) by mouth every 8 (eight) hours. 90 tablet 1  . levalbuterol (XOPENEX HFA) 45 MCG/ACT inhaler Can inhale two puffs every four to six hours as needed for cough or wheeze. 15 g 1  . montelukast (SINGULAIR) 10 MG tablet Take 1 tablet (10 mg total) by mouth at bedtime. 30 tablet 5  . Olopatadine HCl 0.2 % SOLN Can use one drop in each eye once daily if needed for red, itchy, watery eyes. 2.5 mL 5  . sevelamer carbonate (RENVELA) 800 MG tablet Take 1 tablet (800 mg total) by mouth 3 (three) times daily with meals. 90 tablet 1  . sodium bicarbonate 650 MG tablet Take 1 tablet (650 mg total) by mouth 2 (two) times daily. 60 tablet 1   No current facility-administered medications for this visit.     REVIEW OF SYSTEMS:   A 10+ POINT REVIEW OF SYSTEMS WAS OBTAINED including neurology, dermatology, psychiatry, cardiac, respiratory, lymph, extremities, GI, GU, Musculoskeletal, constitutional, breasts, reproductive, HEENT.  All pertinent positives are noted in the HPI.  All others are negative.   PHYSICAL EXAMINATION: ECOG PERFORMANCE STATUS: 2 - Symptomatic, <50% confined to bed  Vitals:   04/10/20 1306  BP: 126/72  Pulse: 90  Resp: 18  Temp: 97.7 F (36.5 C)  SpO2: 98%   Filed Weights   04/10/20 1306  Weight: 207 lb 12.8 oz  (94.3 kg)   Body mass index is 30.69 kg/m.  GENERAL:alert, in no acute distress and comfortable SKIN: no acute rashes, no significant lesions EYES: conjunctiva are pink and non-injected, sclera anicteric OROPHARYNX: MMM, no exudates, no oropharyngeal erythema or ulceration NECK: supple, no JVD LYMPH:  no palpable lymphadenopathy in the cervical, axillary or inguinal regions LUNGS: clear to auscultation b/l with normal respiratory effort HEART: regular rate & rhythm ABDOMEN:  normoactive bowel sounds, non tender, not distended. Extremity: no pedal edema PSYCH: alert & oriented x 3 with fluent speech NEURO: no  focal motor/sensory deficits  LABORATORY DATA:  I have reviewed the data as listed  CBC Latest Ref Rng & Units 03/12/2020 03/11/2020 03/09/2020  WBC 4.0 - 10.5 K/uL 7.8 8.3 14.4(H)  Hemoglobin 13.0 - 17.0 g/dL 9.2(L) 9.0(L) 9.9(L)  Hematocrit 39 - 52 % 28.7(L) 27.7(L) 31.2(L)  Platelets 150 - 400 K/uL 283 272 307   . CBC    Component Value Date/Time   WBC 6.6 04/10/2020 1350   RBC 2.56 (L) 04/10/2020 1350   HGB 8.6 (L) 04/14/2020 1351   HCT 24.9 (L) 04/10/2020 1350   PLT 259 04/10/2020 1350   MCV 97.3 04/10/2020 1350   MCH 30.1 04/10/2020 1350   MCHC 30.9 04/10/2020 1350   RDW 14.0 04/10/2020 1350   LYMPHSABS 0.8 04/10/2020 1350   MONOABS 0.5 04/10/2020 1350   EOSABS 0.8 (H) 04/10/2020 1350   BASOSABS 0.1 04/10/2020 1350     CMP Latest Ref Rng & Units 03/12/2020 03/11/2020 03/10/2020  Glucose 70 - 99 mg/dL 75 76 83  BUN 8 - 23 mg/dL 47(H) 47(H) 50(H)  Creatinine 0.61 - 1.24 mg/dL 5.90(H) 5.95(H) 6.10(H)  Sodium 135 - 145 mmol/L 134(L) 136 136  Potassium 3.5 - 5.1 mmol/L 4.6 4.8 4.8  Chloride 98 - 111 mmol/L 104 106 105  CO2 22 - 32 mmol/L 19(L) 20(L) 20(L)  Calcium 8.9 - 10.3 mg/dL 8.3(L) 8.2(L) 8.2(L)  Total Protein 6.5 - 8.1 g/dL - - -  Total Bilirubin 0.3 - 1.2 mg/dL - - -  Alkaline Phos 38 - 126 U/L - - -  AST 15 - 41 U/L - - -  ALT 0 - 44 U/L - - -    03/12/20 of Surgical Pathology [phone])   03/12/20 of Molecular Pathology   RADIOGRAPHIC STUDIES: I have personally reviewed the radiological images as listed and agreed with the findings in the report. US BIOPSY (KIDNEY)  Result Date: 03/12/2020 INDICATION: Acute on chronic renal failure and need for renal biopsy. EXAM: ULTRASOUND GUIDED CORE BIOPSY OF RIGHT KIDNEY MEDICATIONS: None. ANESTHESIA/SEDATION: Fentanyl 50 mcg IV; Versed 1.0 mg IV Moderate Sedation Time:  19 minutes. The patient was continuously monitored during the procedure by the interventional radiology nurse under my direct supervision. PROCEDURE: The procedure, risks, benefits, and alternatives were explained to the patient. Questions regarding the procedure were encouraged and answered. The patient understands and consents to the procedure. A time-out was performed prior to initiating the procedure. Ultrasound was performed to localize both kidneys. The right flank region was prepped with chlorhexidine in a sterile fashion, and a sterile drape was applied covering the operative field. A sterile gown and sterile gloves were used for the procedure. Local anesthesia was provided with 1% Lidocaine. Under ultrasound guidance, a 15 gauge trocar needle was advanced to the margin of the lower pole cortex of the right kidney. Two separate coaxial 16 gauge core biopsy samples were obtained and submitted in saline. A slurry of Gel-Foam pledgets was then injected via the outer needle as the needle was retracted and removed. Additional ultrasound was performed. COMPLICATIONS: None immediate. FINDINGS: Both kidneys are well visualized by ultrasound. The right was chosen for sampling due to slightly closer proximity to the skin. Solid core biopsy samples were obtained. IMPRESSION: Ultrasound-guided core biopsy performed at the level of right lower pole renal cortex. Electronically Signed   By: Aletta Edouard M.D.   On: 03/12/2020 15:20      ASSESSMENT & PLAN:  JUDD MCCUBBIN is a 64 y.o. male with:  1. Abnormal Kappa/Lambda light chain ratio  Likely due to differential light chain urinary excretion in the setting of CKD 2. CKD - from HTN and DM2, renal bx with no evidence of myeloma kidney or amyloidosis. 3. Normocytic Anemia - likely anemia or chronic kidney disease.   PLAN: -Discussed patient's most recent labs from 03/20/20, of CBC is as follows: all values are WNL except for BUN at 40, Creatinine at 5.06, GFR Non Africn Am at 11, GFR Africn Am at 13, Sodium at 133, Calcium at 8.4, Phosphorus at 4.4, Albumin at 3.6 03/20/20 of Free Light Chain Serum is as follows: all values are WNL except for Free Kappa Lt Chains at 106.5, Free Lambda Lt Chains at 38.7, Kappa/Lambda Ratios at 2.75 -Discussed 03/20/20 of Hemoglobin at 8.8 -Discussed 03/20/20 of Magnesium at 2: WNL -Discussed 03/20/20 of Intact PTH at 124 -Discussed 03/20/20 of 25 Hydroxy Vit D at 15.3 -Educated on Kappa/Lambda Light Chains  -Educated on multiple myeloma -unlikely this  -Educated on dehyration with lisinopril -Educated pt's anemia is caused by CKD -Goal Hemoglobin >9 -Advised pt could be cold due to anemia, dehydration, weight loss -Advised on dialysis -Advised on platelet dysfunction due to kidneys  -Advised iron requirements are much higher- possibly of needing IV iron to maintain ferritin atleast and the ESA's if needed to maintain hgb around 10. -Recommends continue f/u with Nephrologist  -Recommends labs today  -Recommends wearing compression socks  -Will see back as needed based on labs  FOLLOW UP Labs today RTC with Dr Irene Limbo as needed based on labs  . Orders Placed This Encounter  Procedures  . CBC with Differential/Platelet    Standing Status:   Future    Number of Occurrences:   1    Standing Expiration Date:   04/10/2021  . CMP ([place] only)    Standing Status:   Future    Number of Occurrences:   1    Standing  Expiration Date:   04/10/2021  . Ferritin    Standing Status:   Future    Number of Occurrences:   1    Standing Expiration Date:   04/10/2021  . Iron and TIBC    Standing Status:   Future    Number of Occurrences:   1    Standing Expiration Date:   04/10/2021  . Multiple Myeloma Panel (SPEP&IFE w/QIG)    Standing Status:   Future    Number of Occurrences:   1    Standing Expiration Date:   04/10/2021  . Kappa/lambda light chains    Standing Status:   Future    Number of Occurrences:   1    Standing Expiration Date:   04/10/2021  . ABO/RH    Standing Status:   Future    Number of Occurrences:   1    Standing Expiration Date:   04/10/2021    The total time spent in the appt was 60 minutes and more than 50% was on counseling and direct patient cares.  All of the patient's questions were answered with apparent satisfaction. The patient knows to call the clinic with any problems, questions or concerns.   Sullivan Lone MD MS AAHIVMS [facility] Hematology/Oncology Physician [facility]  (Office):       [phone] (Work cell):  [phone] (Fax):           [phone]  04/10/2020 1:45 PM  I, Dawayne Cirri am acting as a Education administrator for Dr. Sullivan Lone.   Marland Kitchen  I have reviewed the above documentation for accuracy and completeness, and I agree with the above. Brunetta Genera MD     ADDENDUM  Component     Latest Ref Rng & Units 04/10/2020  IgG (Immunoglobin G), Serum     603 - 1,613 mg/dL 816  IgA     61 - 437 mg/dL 214  IgM (Immunoglobulin M), Srm     20 - 172 mg/dL 44  Total Protein ELP     6.0 - 8.5 g/dL 6.1  Albumin SerPl Elph-Mcnc     2.9 - 4.4 g/dL 3.4  Alpha 1     0.0 - 0.4 g/dL 0.3  Alpha2 Glob SerPl Elph-Mcnc     0.4 - 1.0 g/dL 0.7  B-Globulin SerPl Elph-Mcnc     0.7 - 1.3 g/dL 0.8  Gamma Glob SerPl Elph-Mcnc     0.4 - 1.8 g/dL 0.9  M Protein SerPl Elph-Mcnc     Not Observed g/dL Not Observed  Globulin, Total     2.2 - 3.9 g/dL 2.7  Albumin/Glob SerPl      0.7 - 1.7 1.3  IFE 1      Comment  Please Note (HCV):      Comment  Iron     42 - 163 ug/dL 37 (L)  TIBC     202 - 409 ug/dL 235  Saturation Ratios     20 - 55 % 16 (L)  UIBC     117 - 376 ug/dL 198  Kappa free light chain     3.3 - 19.4 mg/L 120.4 (H)  Lamda free light chains     5.7 - 26.3 mg/L 49.0 (H)  Kappa, lamda light chain ratio     0.26 - 1.65 2.46 (H)  ABO/RH(D)      A POS . . .  Ferritin     24 - 336 ng/mL 380 (H)   Ferritin >250 -- no indication for additional IV Iron at this time SPEP with no M spike. No additional hematologic w/u recommended at this time  F/u with nephrology for mx of ckd and ESA's as needed  .Brunetta Genera MD

## 2020-04-10 ENCOUNTER — Other Ambulatory Visit: Payer: Self-pay

## 2020-04-10 ENCOUNTER — Inpatient Hospital Stay: Payer: BC Managed Care – PPO

## 2020-04-10 ENCOUNTER — Inpatient Hospital Stay: Payer: BC Managed Care – PPO | Attending: Hematology | Admitting: Hematology

## 2020-04-10 VITALS — BP 126/72 | HR 90 | Temp 97.7°F | Resp 18 | Ht 69.0 in | Wt 207.8 lb

## 2020-04-10 DIAGNOSIS — N189 Chronic kidney disease, unspecified: Secondary | ICD-10-CM | POA: Insufficient documentation

## 2020-04-10 DIAGNOSIS — D649 Anemia, unspecified: Secondary | ICD-10-CM | POA: Diagnosis not present

## 2020-04-10 DIAGNOSIS — D472 Monoclonal gammopathy: Secondary | ICD-10-CM

## 2020-04-10 DIAGNOSIS — E1122 Type 2 diabetes mellitus with diabetic chronic kidney disease: Secondary | ICD-10-CM | POA: Diagnosis not present

## 2020-04-10 DIAGNOSIS — I129 Hypertensive chronic kidney disease with stage 1 through stage 4 chronic kidney disease, or unspecified chronic kidney disease: Secondary | ICD-10-CM | POA: Diagnosis not present

## 2020-04-10 LAB — CMP (CANCER CENTER ONLY)
ALT: 22 U/L (ref 0–44)
AST: 22 U/L (ref 15–41)
Albumin: 3.3 g/dL — ABNORMAL LOW (ref 3.5–5.0)
Alkaline Phosphatase: 159 U/L — ABNORMAL HIGH (ref 38–126)
Anion gap: 9 (ref 5–15)
BUN: 53 mg/dL — ABNORMAL HIGH (ref 8–23)
CO2: 21 mmol/L — ABNORMAL LOW (ref 22–32)
Calcium: 8.8 mg/dL — ABNORMAL LOW (ref 8.9–10.3)
Chloride: 111 mmol/L (ref 98–111)
Creatinine: 4.51 mg/dL (ref 0.61–1.24)
GFR, Est AFR Am: 15 mL/min — ABNORMAL LOW (ref >60)
GFR, Estimated: 13 mL/min — ABNORMAL LOW (ref >60)
Glucose, Bld: 123 mg/dL — ABNORMAL HIGH (ref 70–99)
Potassium: 4.8 mmol/L (ref 3.5–5.1)
Sodium: 141 mmol/L (ref 135–145)
Total Bilirubin: 0.5 mg/dL (ref 0.3–1.2)
Total Protein: 6.4 g/dL — ABNORMAL LOW (ref 6.5–8.1)

## 2020-04-10 LAB — CBC WITH DIFFERENTIAL/PLATELET
Abs Immature Granulocytes: 0.01 10*3/uL (ref 0.00–0.07)
Basophils Absolute: 0.1 10*3/uL (ref 0.0–0.1)
Basophils Relative: 1 %
Eosinophils Absolute: 0.8 10*3/uL — ABNORMAL HIGH (ref 0.0–0.5)
Eosinophils Relative: 12 %
HCT: 24.9 % — ABNORMAL LOW (ref 39.0–52.0)
Hemoglobin: 7.7 g/dL — ABNORMAL LOW (ref 13.0–17.0)
Immature Granulocytes: 0 %
Lymphocytes Relative: 12 %
Lymphs Abs: 0.8 10*3/uL (ref 0.7–4.0)
MCH: 30.1 pg (ref 26.0–34.0)
MCHC: 30.9 g/dL (ref 30.0–36.0)
MCV: 97.3 fL (ref 80.0–100.0)
Monocytes Absolute: 0.5 10*3/uL (ref 0.1–1.0)
Monocytes Relative: 8 %
Neutro Abs: 4.4 10*3/uL (ref 1.7–7.7)
Neutrophils Relative %: 67 %
Platelets: 259 10*3/uL (ref 150–400)
RBC: 2.56 MIL/uL — ABNORMAL LOW (ref 4.22–5.81)
RDW: 14 % (ref 11.5–15.5)
WBC: 6.6 10*3/uL (ref 4.0–10.5)
nRBC: 0 % (ref 0.0–0.2)

## 2020-04-10 LAB — ABO/RH: ABO/RH(D): A POS

## 2020-04-10 LAB — IRON AND TIBC
Iron: 37 ug/dL — ABNORMAL LOW (ref 42–163)
Saturation Ratios: 16 % — ABNORMAL LOW (ref 20–55)
TIBC: 235 ug/dL (ref 202–409)
UIBC: 198 ug/dL (ref 117–376)

## 2020-04-10 LAB — FERRITIN: Ferritin: 380 ng/mL — ABNORMAL HIGH (ref 24–336)

## 2020-04-10 NOTE — Progress Notes (Signed)
Creatinine 4.51. Provider made aware

## 2020-04-11 ENCOUNTER — Other Ambulatory Visit (HOSPITAL_COMMUNITY): Payer: Self-pay | Admitting: *Deleted

## 2020-04-11 LAB — KAPPA/LAMBDA LIGHT CHAINS
Kappa free light chain: 120.4 mg/L — ABNORMAL HIGH (ref 3.3–19.4)
Kappa, lambda light chain ratio: 2.46 — ABNORMAL HIGH (ref 0.26–1.65)
Lambda free light chains: 49 mg/L — ABNORMAL HIGH (ref 5.7–26.3)

## 2020-04-11 NOTE — Discharge Instructions (Signed)

## 2020-04-14 ENCOUNTER — Other Ambulatory Visit: Payer: Self-pay

## 2020-04-14 ENCOUNTER — Encounter (HOSPITAL_COMMUNITY)
Admission: RE | Admit: 2020-04-14 | Discharge: 2020-04-14 | Disposition: A | Discharge status: Home / Self Care | Payer: BC Managed Care – PPO | Source: Ambulatory Visit | Attending: Nephrology | Admitting: Nephrology

## 2020-04-14 DIAGNOSIS — N184 Chronic kidney disease, stage 4 (severe): Secondary | ICD-10-CM | POA: Insufficient documentation

## 2020-04-14 DIAGNOSIS — D631 Anemia in chronic kidney disease: Secondary | ICD-10-CM | POA: Insufficient documentation

## 2020-04-14 LAB — MULTIPLE MYELOMA PANEL, SERUM
Albumin SerPl Elph-Mcnc: 3.4 g/dL (ref 2.9–4.4)
Albumin/Glob SerPl: 1.3 (ref 0.7–1.7)
Alpha 1: 0.3 g/dL (ref 0.0–0.4)
Alpha2 Glob SerPl Elph-Mcnc: 0.7 g/dL (ref 0.4–1.0)
B-Globulin SerPl Elph-Mcnc: 0.8 g/dL (ref 0.7–1.3)
Gamma Glob SerPl Elph-Mcnc: 0.9 g/dL (ref 0.4–1.8)
Globulin, Total: 2.7 g/dL (ref 2.2–3.9)
IgA: 214 mg/dL (ref 61–437)
IgG (Immunoglobin G), Serum: 816 mg/dL (ref 603–1613)
IgM (Immunoglobulin M), Srm: 44 mg/dL (ref 20–172)
Total Protein ELP: 6.1 g/dL (ref 6.0–8.5)

## 2020-04-14 LAB — POCT HEMOGLOBIN-HEMACUE: Hemoglobin: 8.6 g/dL — ABNORMAL LOW (ref 13.0–17.0)

## 2020-04-14 MED ORDER — EPOETIN ALFA-EPBX 10000 UNIT/ML IJ SOLN
10000.0000 [IU] | Freq: Once | INTRAMUSCULAR | Status: AC
  Administered 2020-04-14: 10000 [IU] via SUBCUTANEOUS

## 2020-04-14 MED ORDER — EPOETIN ALFA-EPBX 10000 UNIT/ML IJ SOLN
INTRAMUSCULAR | Status: AC
  Filled 2020-04-14: qty 1

## 2020-04-23 DIAGNOSIS — N184 Chronic kidney disease, stage 4 (severe): Secondary | ICD-10-CM | POA: Diagnosis not present

## 2020-04-25 DIAGNOSIS — R809 Proteinuria, unspecified: Secondary | ICD-10-CM | POA: Diagnosis not present

## 2020-04-25 DIAGNOSIS — D631 Anemia in chronic kidney disease: Secondary | ICD-10-CM | POA: Diagnosis not present

## 2020-04-25 DIAGNOSIS — I120 Hypertensive chronic kidney disease with stage 5 chronic kidney disease or end stage renal disease: Secondary | ICD-10-CM | POA: Diagnosis not present

## 2020-04-25 DIAGNOSIS — N185 Chronic kidney disease, stage 5: Secondary | ICD-10-CM | POA: Diagnosis not present

## 2020-04-28 ENCOUNTER — Encounter (HOSPITAL_COMMUNITY): Payer: BC Managed Care – PPO

## 2020-04-28 DIAGNOSIS — H35033 Hypertensive retinopathy, bilateral: Secondary | ICD-10-CM | POA: Diagnosis not present

## 2020-04-28 DIAGNOSIS — E113511 Type 2 diabetes mellitus with proliferative diabetic retinopathy with macular edema, right eye: Secondary | ICD-10-CM | POA: Diagnosis not present

## 2020-04-28 DIAGNOSIS — E113412 Type 2 diabetes mellitus with severe nonproliferative diabetic retinopathy with macular edema, left eye: Secondary | ICD-10-CM | POA: Diagnosis not present

## 2020-04-28 DIAGNOSIS — H43823 Vitreomacular adhesion, bilateral: Secondary | ICD-10-CM | POA: Diagnosis not present

## 2020-04-29 ENCOUNTER — Encounter (HOSPITAL_COMMUNITY)
Admission: RE | Admit: 2020-04-29 | Discharge: 2020-04-29 | Disposition: A | Discharge status: Home / Self Care | Payer: BC Managed Care – PPO | Source: Ambulatory Visit | Attending: Nephrology | Admitting: Nephrology

## 2020-04-29 ENCOUNTER — Encounter (HOSPITAL_COMMUNITY): Payer: BC Managed Care – PPO

## 2020-04-29 ENCOUNTER — Other Ambulatory Visit: Payer: Self-pay

## 2020-04-29 VITALS — BP 152/85 | HR 85 | Resp 18

## 2020-04-29 DIAGNOSIS — D631 Anemia in chronic kidney disease: Secondary | ICD-10-CM

## 2020-04-29 DIAGNOSIS — N184 Chronic kidney disease, stage 4 (severe): Secondary | ICD-10-CM | POA: Diagnosis not present

## 2020-04-29 LAB — POCT HEMOGLOBIN-HEMACUE: Hemoglobin: 8.2 g/dL — ABNORMAL LOW (ref 13.0–17.0)

## 2020-04-29 MED ORDER — EPOETIN ALFA-EPBX 10000 UNIT/ML IJ SOLN
INTRAMUSCULAR | Status: AC
  Filled 2020-04-29: qty 1

## 2020-04-29 MED ORDER — EPOETIN ALFA-EPBX 10000 UNIT/ML IJ SOLN
10000.0000 [IU] | INTRAMUSCULAR | Status: DC
  Administered 2020-04-29: 10000 [IU] via SUBCUTANEOUS

## 2020-05-08 DIAGNOSIS — N185 Chronic kidney disease, stage 5: Secondary | ICD-10-CM | POA: Diagnosis not present

## 2020-05-09 ENCOUNTER — Other Ambulatory Visit (HOSPITAL_COMMUNITY): Payer: Self-pay | Admitting: *Deleted

## 2020-05-09 DIAGNOSIS — D631 Anemia in chronic kidney disease: Secondary | ICD-10-CM | POA: Diagnosis not present

## 2020-05-09 DIAGNOSIS — R809 Proteinuria, unspecified: Secondary | ICD-10-CM | POA: Diagnosis not present

## 2020-05-09 DIAGNOSIS — I120 Hypertensive chronic kidney disease with stage 5 chronic kidney disease or end stage renal disease: Secondary | ICD-10-CM | POA: Diagnosis not present

## 2020-05-09 DIAGNOSIS — N185 Chronic kidney disease, stage 5: Secondary | ICD-10-CM | POA: Diagnosis not present

## 2020-05-13 ENCOUNTER — Other Ambulatory Visit: Payer: Self-pay

## 2020-05-13 ENCOUNTER — Encounter (HOSPITAL_COMMUNITY)
Admission: RE | Admit: 2020-05-13 | Discharge: 2020-05-13 | Disposition: A | Discharge status: Home / Self Care | Payer: BC Managed Care – PPO | Source: Ambulatory Visit | Attending: Nephrology | Admitting: Nephrology

## 2020-05-13 VITALS — BP 148/85 | HR 72 | Temp 97.9°F | Resp 18

## 2020-05-13 DIAGNOSIS — D631 Anemia in chronic kidney disease: Secondary | ICD-10-CM | POA: Diagnosis not present

## 2020-05-13 DIAGNOSIS — N184 Chronic kidney disease, stage 4 (severe): Secondary | ICD-10-CM | POA: Diagnosis not present

## 2020-05-13 LAB — POCT HEMOGLOBIN-HEMACUE: Hemoglobin: 8.6 g/dL — ABNORMAL LOW (ref 13.0–17.0)

## 2020-05-13 MED ORDER — SODIUM CHLORIDE 0.9 % IV SOLN
510.0000 mg | Freq: Once | INTRAVENOUS | Status: AC
  Administered 2020-05-13: 510 mg via INTRAVENOUS
  Filled 2020-05-13: qty 17

## 2020-05-13 MED ORDER — EPOETIN ALFA-EPBX 10000 UNIT/ML IJ SOLN
10000.0000 [IU] | INTRAMUSCULAR | Status: DC
  Administered 2020-05-13: 10000 [IU] via SUBCUTANEOUS

## 2020-05-13 MED ORDER — EPOETIN ALFA-EPBX 10000 UNIT/ML IJ SOLN
INTRAMUSCULAR | Status: AC
  Filled 2020-05-13: qty 1

## 2020-05-13 NOTE — Discharge Instructions (Signed)

## 2020-05-20 DIAGNOSIS — E113513 Type 2 diabetes mellitus with proliferative diabetic retinopathy with macular edema, bilateral: Secondary | ICD-10-CM | POA: Diagnosis not present

## 2020-05-20 DIAGNOSIS — H43823 Vitreomacular adhesion, bilateral: Secondary | ICD-10-CM | POA: Diagnosis not present

## 2020-05-20 DIAGNOSIS — H35033 Hypertensive retinopathy, bilateral: Secondary | ICD-10-CM | POA: Diagnosis not present

## 2020-05-20 DIAGNOSIS — H2513 Age-related nuclear cataract, bilateral: Secondary | ICD-10-CM | POA: Diagnosis not present

## 2020-05-26 DIAGNOSIS — E113511 Type 2 diabetes mellitus with proliferative diabetic retinopathy with macular edema, right eye: Secondary | ICD-10-CM | POA: Diagnosis not present

## 2020-05-27 ENCOUNTER — Other Ambulatory Visit: Payer: Self-pay

## 2020-05-27 ENCOUNTER — Encounter (HOSPITAL_COMMUNITY)
Admission: RE | Admit: 2020-05-27 | Discharge: 2020-05-27 | Disposition: A | Discharge status: Home / Self Care | Payer: BC Managed Care – PPO | Source: Ambulatory Visit | Attending: Nephrology | Admitting: Nephrology

## 2020-05-27 VITALS — BP 142/83 | HR 70 | Temp 97.1°F | Resp 18

## 2020-05-27 DIAGNOSIS — D631 Anemia in chronic kidney disease: Secondary | ICD-10-CM

## 2020-05-27 DIAGNOSIS — N184 Chronic kidney disease, stage 4 (severe): Secondary | ICD-10-CM | POA: Diagnosis not present

## 2020-05-27 LAB — POCT HEMOGLOBIN-HEMACUE: Hemoglobin: 10.4 g/dL — ABNORMAL LOW (ref 13.0–17.0)

## 2020-05-27 LAB — IRON AND TIBC
Iron: 29 ug/dL — ABNORMAL LOW (ref 45–182)
Saturation Ratios: 10 % — ABNORMAL LOW (ref 17.9–39.5)
TIBC: 279 ug/dL (ref 250–450)
UIBC: 250 ug/dL

## 2020-05-27 LAB — FERRITIN: Ferritin: 555 ng/mL — ABNORMAL HIGH (ref 24–336)

## 2020-05-27 MED ORDER — EPOETIN ALFA-EPBX 10000 UNIT/ML IJ SOLN
INTRAMUSCULAR | Status: AC
  Filled 2020-05-27: qty 1

## 2020-05-27 MED ORDER — EPOETIN ALFA-EPBX 10000 UNIT/ML IJ SOLN
10000.0000 [IU] | INTRAMUSCULAR | Status: DC
  Administered 2020-05-27: 10000 [IU] via SUBCUTANEOUS

## 2020-06-09 DIAGNOSIS — E113512 Type 2 diabetes mellitus with proliferative diabetic retinopathy with macular edema, left eye: Secondary | ICD-10-CM | POA: Diagnosis not present

## 2020-06-10 ENCOUNTER — Other Ambulatory Visit: Payer: Self-pay

## 2020-06-10 ENCOUNTER — Encounter (HOSPITAL_COMMUNITY)
Admission: RE | Admit: 2020-06-10 | Discharge: 2020-06-10 | Disposition: A | Discharge status: Home / Self Care | Payer: BC Managed Care – PPO | Source: Ambulatory Visit | Attending: Nephrology | Admitting: Nephrology

## 2020-06-10 VITALS — BP 147/84 | HR 73 | Temp 97.4°F | Resp 18

## 2020-06-10 DIAGNOSIS — D631 Anemia in chronic kidney disease: Secondary | ICD-10-CM | POA: Diagnosis not present

## 2020-06-10 DIAGNOSIS — N184 Chronic kidney disease, stage 4 (severe): Secondary | ICD-10-CM | POA: Insufficient documentation

## 2020-06-10 LAB — POCT HEMOGLOBIN-HEMACUE: Hemoglobin: 10.4 g/dL — ABNORMAL LOW (ref 13.0–17.0)

## 2020-06-10 MED ORDER — EPOETIN ALFA-EPBX 10000 UNIT/ML IJ SOLN
INTRAMUSCULAR | Status: AC
  Filled 2020-06-10: qty 1

## 2020-06-10 MED ORDER — EPOETIN ALFA-EPBX 10000 UNIT/ML IJ SOLN
10000.0000 [IU] | INTRAMUSCULAR | Status: DC
  Administered 2020-06-10: 10000 [IU] via SUBCUTANEOUS

## 2020-06-16 DIAGNOSIS — E113511 Type 2 diabetes mellitus with proliferative diabetic retinopathy with macular edema, right eye: Secondary | ICD-10-CM | POA: Diagnosis not present

## 2020-06-23 ENCOUNTER — Other Ambulatory Visit (HOSPITAL_COMMUNITY): Payer: Self-pay | Admitting: *Deleted

## 2020-06-23 DIAGNOSIS — E113512 Type 2 diabetes mellitus with proliferative diabetic retinopathy with macular edema, left eye: Secondary | ICD-10-CM | POA: Diagnosis not present

## 2020-06-24 ENCOUNTER — Ambulatory Visit (INDEPENDENT_AMBULATORY_CARE_PROVIDER_SITE_OTHER): Payer: BC Managed Care – PPO | Admitting: Allergy

## 2020-06-24 ENCOUNTER — Encounter (HOSPITAL_COMMUNITY): Payer: BC Managed Care – PPO

## 2020-06-24 ENCOUNTER — Encounter: Payer: Self-pay | Admitting: Allergy

## 2020-06-24 ENCOUNTER — Encounter (HOSPITAL_COMMUNITY)
Admission: RE | Admit: 2020-06-24 | Discharge: 2020-06-24 | Disposition: A | Discharge status: Home / Self Care | Payer: BC Managed Care – PPO | Source: Ambulatory Visit | Attending: Nephrology | Admitting: Nephrology

## 2020-06-24 ENCOUNTER — Other Ambulatory Visit: Payer: Self-pay

## 2020-06-24 VITALS — BP 146/75 | HR 66 | Temp 97.8°F | Resp 18

## 2020-06-24 VITALS — BP 140/86 | HR 86 | Resp 18

## 2020-06-24 DIAGNOSIS — J301 Allergic rhinitis due to pollen: Secondary | ICD-10-CM

## 2020-06-24 DIAGNOSIS — N184 Chronic kidney disease, stage 4 (severe): Secondary | ICD-10-CM

## 2020-06-24 DIAGNOSIS — H1013 Acute atopic conjunctivitis, bilateral: Secondary | ICD-10-CM | POA: Diagnosis not present

## 2020-06-24 DIAGNOSIS — D631 Anemia in chronic kidney disease: Secondary | ICD-10-CM | POA: Diagnosis not present

## 2020-06-24 DIAGNOSIS — J4540 Moderate persistent asthma, uncomplicated: Secondary | ICD-10-CM | POA: Diagnosis not present

## 2020-06-24 LAB — POCT HEMOGLOBIN-HEMACUE: Hemoglobin: 10.1 g/dL — ABNORMAL LOW (ref 13.0–17.0)

## 2020-06-24 MED ORDER — SODIUM CHLORIDE 0.9 % IV SOLN
510.0000 mg | Freq: Once | INTRAVENOUS | Status: AC
  Administered 2020-06-24: 510 mg via INTRAVENOUS
  Filled 2020-06-24: qty 17

## 2020-06-24 MED ORDER — EPOETIN ALFA-EPBX 10000 UNIT/ML IJ SOLN
INTRAMUSCULAR | Status: AC
  Administered 2020-06-24: 10000 [IU]
  Filled 2020-06-24: qty 1

## 2020-06-24 MED ORDER — EPOETIN ALFA-EPBX 10000 UNIT/ML IJ SOLN: 10000.0000 [IU] | INTRAMUSCULAR | Status: DC

## 2020-06-24 MED ORDER — IPRATROPIUM BROMIDE 0.06 % NA SOLN: 2.0000 | Freq: Two times a day (BID) | NASAL | 5 refills | Status: DC

## 2020-06-24 MED ORDER — BUDESONIDE-FORMOTEROL FUMARATE 160-4.5 MCG/ACT IN AERO: 2.0000 | INHALATION_SPRAY | Freq: Two times a day (BID) | RESPIRATORY_TRACT | 5 refills | Status: DC

## 2020-06-24 NOTE — Progress Notes (Signed)
Follow-up Note  RE: Benjamin [name] MRN: [id number] DOB: [birth year] Date of Office Visit: 06/24/2020   History of present illness: Benjamin [name] is a 64 y.o. male presenting today for follow-up of allergic rhinitis and asthma.  He was last seen in the office on 03/18/20 by myself.    He is doing astelin 2 sprays each nostril twice a day.  He reports that the nasal drainage is not better with the spray. He states over the weekend his nasal drainage was so bad that he states he was "leaking".  He does states the pataday helps on occasion when he uses it.   He states over the past several weeks he has had laser eye surgery and states he was not provided any drops and states he was told he can use the pataday eye drop as needed.  He also states the singulair helps some.  He doesn't notice his typical nighttime congestion since starting singulair.  However still reports he wakes up wheezing in the mornings and uses his xopenex inhaler at that time.  Denies any ED/UC visits or systemic steroid needs since last visit.   Review of systems: Review of Systems  Constitutional: Negative.   HENT:       See HPI  Eyes:       See HPI  Respiratory:       See HPI  Cardiovascular: Negative.   Gastrointestinal: Negative.   Musculoskeletal: Negative.   Skin: Negative.   Neurological: Negative.     All other systems negative unless noted above in HPI  Past medical/social/surgical/family history have been reviewed and are unchanged unless specifically indicated below.  No changes  Medication List: Current Outpatient Medications  Medication Sig Dispense Refill   albuterol (VENTOLIN HFA) 108 (90 Base) MCG/ACT inhaler Can inhale two puffs every four to six hours as needed for cough, wheeze, shortness of breath, or chest tightness. 8.5 g 1   furosemide (LASIX) 40 MG tablet Take by mouth.     hydrALAZINE (APRESOLINE) 50 MG tablet Take 1 tablet (50 mg total) by mouth every 8 (eight) hours. 90  tablet 1   levalbuterol (XOPENEX HFA) 45 MCG/ACT inhaler Can inhale two puffs every four to six hours as needed for cough or wheeze. 15 g 1   montelukast (SINGULAIR) 10 MG tablet Take 1 tablet (10 mg total) by mouth at bedtime. 30 tablet 5   Olopatadine HCl 0.2 % SOLN Can use one drop in each eye once daily if needed for red, itchy, watery eyes. 2.5 mL 5   sevelamer carbonate (RENVELA) 800 MG tablet Take 1 tablet (800 mg total) by mouth 3 (three) times daily with meals. 90 tablet 1   sodium bicarbonate 650 MG tablet Take 1 tablet (650 mg total) by mouth 2 (two) times daily. 60 tablet 1   budesonide-formoterol (SYMBICORT) 160-4.5 MCG/ACT inhaler Inhale 2 puffs into the lungs 2 (two) times daily. Rinse, gargle and spit after use. 1 each 5   ipratropium (ATROVENT) 0.06 % nasal spray Place 2 sprays into both nostrils 2 (two) times daily. 15 mL 5   No current facility-administered medications for this visit.   Facility-Administered Medications Ordered in Other Visits  Medication Dose Route Frequency Provider Last Rate Last Admin   epoetin alfa-epbx (RETACRIT) injection 10,000 Units  10,000 Units Subcutaneous Q14 Days Claudia Desanctis, MD         Known medication allergies: Allergies  Allergen Reactions   Grass Pollen(K-O-R-T-Swt Vern) Other (See  Comments)    Eyes run, shortness of breath, itching      Physical examination: Blood pressure 140/86, pulse 86, resp. rate 18, SpO2 96 %.  General: Alert, interactive, in no acute distress. HEENT: PERRLA, TMs pearly gray, turbinates non-edematous with clear discharge, post-pharynx non erythematous. Neck: Supple without lymphadenopathy. Lungs: Mildly decreased breath sounds with expiratory wheezing bilaterally. {no increased work of breathing.  Decreased wheezing post albuterol use in office CV: Normal S1, S2 without murmurs. Abdomen: Nondistended, nontender. Skin: Warm and dry, without lesions or rashes. Extremities:  No clubbing, cyanosis  or edema. Neuro:   Grossly intact.  Diagnositics/Labs: None today  Assessment and plan:   Allergic rhinitis with conjunctivitis  - continue avoidance measures for grass pollen, tree pollen, weed pollen   - stop Astelin as has not been effective in controlling nasal drainage  - start nasal Atrovent 2 sprays each nostril twice a day.  This is a nasal spray to help with nasal drainage  - for watery or itchy eyes use Pataday or Pataday Xtra Strength 1 drop each eye daily as needed  - continue Montelukast 10mg  daily at bedtime.  Asthma, mod persistent  - have access to Xopenex inhaler 2 puffs every 4-6 hours as needed for cough/wheeze/shortness of breath/chest tightness.  May use 15-20 minutes prior to activity.   Monitor frequency of use.    - start Symbicort 156mcg 2 puffs twice day.  This is a controller inhaler to help reduce your daily symptoms and reduce Xopenex use  - Montelukast as above.    Control goals:   Full participation in all desired activities (may need albuterol before activity)  Albuterol use two time or less a week on average (not counting use with activity)  Cough interfering with sleep two time or less a month  Oral steroids no more than once a year  No hospitalizations  Follow-up in 3 months or sooner if needed    No follow-ups on file.  I appreciate the opportunity to take part in Brennan's care. Please do not hesitate to contact me with questions.  Sincerely,   Prudy Feeler, MD Allergy/Immunology [facility]

## 2020-06-24 NOTE — Patient Instructions (Addendum)
Allergies  - continue avoidance measures for grass pollen, tree pollen, weed pollen   - stop Astelin as has not been effective in controlling nasal drainage  - start nasal Atrovent 2 sprays each nostril twice a day.  This is a nasal spray to help with nasal drainage  - for watery or itchy eyes use Pataday or Pataday Xtra Strength 1 drop each eye daily as needed  - continue Montelukast 10mg  daily at bedtime.  Reactive airway  - have access to Xopenex inhaler 2 puffs every 4-6 hours as needed for cough/wheeze/shortness of breath/chest tightness.  May use 15-20 minutes prior to activity.   Monitor frequency of use.    - start Symbicort 128mcg 2 puffs twice day.  This is a controller inhaler to help reduce your daily symptoms and reduce Xopenex use  - Montelukast as above.    Control goals:   Full participation in all desired activities (may need albuterol before activity)  Albuterol use two time or less a week on average (not counting use with activity)  Cough interfering with sleep two time or less a month  Oral steroids no more than once a year  No hospitalizations  Follow-up in 3 months or sooner if needed

## 2020-07-02 DIAGNOSIS — I10 Essential (primary) hypertension: Secondary | ICD-10-CM | POA: Diagnosis not present

## 2020-07-02 DIAGNOSIS — E785 Hyperlipidemia, unspecified: Secondary | ICD-10-CM | POA: Diagnosis not present

## 2020-07-02 DIAGNOSIS — E1169 Type 2 diabetes mellitus with other specified complication: Secondary | ICD-10-CM | POA: Diagnosis not present

## 2020-07-02 DIAGNOSIS — Z6826 Body mass index (BMI) 26.0-26.9, adult: Secondary | ICD-10-CM | POA: Diagnosis not present

## 2020-07-08 ENCOUNTER — Other Ambulatory Visit: Payer: Self-pay

## 2020-07-08 ENCOUNTER — Encounter (HOSPITAL_COMMUNITY)
Admission: RE | Admit: 2020-07-08 | Discharge: 2020-07-08 | Disposition: A | Discharge status: Home / Self Care | Payer: BC Managed Care – PPO | Source: Ambulatory Visit | Attending: Nephrology | Admitting: Nephrology

## 2020-07-08 VITALS — BP 154/86 | HR 67 | Temp 97.6°F | Resp 16

## 2020-07-08 DIAGNOSIS — N184 Chronic kidney disease, stage 4 (severe): Secondary | ICD-10-CM | POA: Insufficient documentation

## 2020-07-08 DIAGNOSIS — D631 Anemia in chronic kidney disease: Secondary | ICD-10-CM | POA: Diagnosis not present

## 2020-07-08 LAB — FERRITIN: Ferritin: 338 ng/mL — ABNORMAL HIGH (ref 24–336)

## 2020-07-08 LAB — IRON AND TIBC
Iron: 48 ug/dL (ref 45–182)
Saturation Ratios: 18 % (ref 17.9–39.5)
TIBC: 266 ug/dL (ref 250–450)
UIBC: 218 ug/dL

## 2020-07-08 LAB — POCT HEMOGLOBIN-HEMACUE: Hemoglobin: 11.2 g/dL — ABNORMAL LOW (ref 13.0–17.0)

## 2020-07-08 MED ORDER — EPOETIN ALFA-EPBX 10000 UNIT/ML IJ SOLN
10000.0000 [IU] | INTRAMUSCULAR | Status: DC
  Administered 2020-07-08: 10000 [IU] via SUBCUTANEOUS

## 2020-07-08 MED ORDER — EPOETIN ALFA-EPBX 10000 UNIT/ML IJ SOLN
INTRAMUSCULAR | Status: AC
  Filled 2020-07-08: qty 1

## 2020-07-11 DIAGNOSIS — D631 Anemia in chronic kidney disease: Secondary | ICD-10-CM | POA: Diagnosis not present

## 2020-07-11 DIAGNOSIS — I120 Hypertensive chronic kidney disease with stage 5 chronic kidney disease or end stage renal disease: Secondary | ICD-10-CM | POA: Diagnosis not present

## 2020-07-11 DIAGNOSIS — R768 Other specified abnormal immunological findings in serum: Secondary | ICD-10-CM | POA: Diagnosis not present

## 2020-07-11 DIAGNOSIS — N185 Chronic kidney disease, stage 5: Secondary | ICD-10-CM | POA: Diagnosis not present

## 2020-07-11 DIAGNOSIS — R809 Proteinuria, unspecified: Secondary | ICD-10-CM | POA: Diagnosis not present

## 2020-07-22 ENCOUNTER — Other Ambulatory Visit: Payer: Self-pay

## 2020-07-22 ENCOUNTER — Encounter (HOSPITAL_COMMUNITY)
Admission: RE | Admit: 2020-07-22 | Discharge: 2020-07-22 | Disposition: A | Discharge status: Home / Self Care | Payer: BC Managed Care – PPO | Source: Ambulatory Visit | Attending: Nephrology | Admitting: Nephrology

## 2020-07-22 VITALS — BP 159/87 | HR 74 | Temp 97.8°F | Resp 18

## 2020-07-22 DIAGNOSIS — E113513 Type 2 diabetes mellitus with proliferative diabetic retinopathy with macular edema, bilateral: Secondary | ICD-10-CM | POA: Diagnosis not present

## 2020-07-22 DIAGNOSIS — D631 Anemia in chronic kidney disease: Secondary | ICD-10-CM | POA: Diagnosis not present

## 2020-07-22 DIAGNOSIS — N184 Chronic kidney disease, stage 4 (severe): Secondary | ICD-10-CM | POA: Diagnosis not present

## 2020-07-22 DIAGNOSIS — H2513 Age-related nuclear cataract, bilateral: Secondary | ICD-10-CM | POA: Diagnosis not present

## 2020-07-22 DIAGNOSIS — H43823 Vitreomacular adhesion, bilateral: Secondary | ICD-10-CM | POA: Diagnosis not present

## 2020-07-22 DIAGNOSIS — H35033 Hypertensive retinopathy, bilateral: Secondary | ICD-10-CM | POA: Diagnosis not present

## 2020-07-22 MED ORDER — EPOETIN ALFA-EPBX 10000 UNIT/ML IJ SOLN
10000.0000 [IU] | INTRAMUSCULAR | Status: DC
  Administered 2020-07-22: 10000 [IU] via SUBCUTANEOUS

## 2020-07-22 MED ORDER — EPOETIN ALFA-EPBX 10000 UNIT/ML IJ SOLN
INTRAMUSCULAR | Status: AC
  Filled 2020-07-22: qty 1

## 2020-07-23 LAB — POCT HEMOGLOBIN-HEMACUE: Hemoglobin: 10.9 g/dL — ABNORMAL LOW (ref 13.0–17.0)

## 2020-07-25 ENCOUNTER — Other Ambulatory Visit (HOSPITAL_COMMUNITY): Payer: Self-pay | Admitting: *Deleted

## 2020-07-25 IMAGING — US US RENAL
1 series · 14 of 25 positions shown · non-contrast
Comparison: None.

CLINICAL DATA: Stage 3 kidney disease.

EXAM:
RENAL / URINARY TRACT ULTRASOUND COMPLETE

[Series 1: us renal · 0.23mm/px · 14 of 44 slices shown]
[im 1/44]
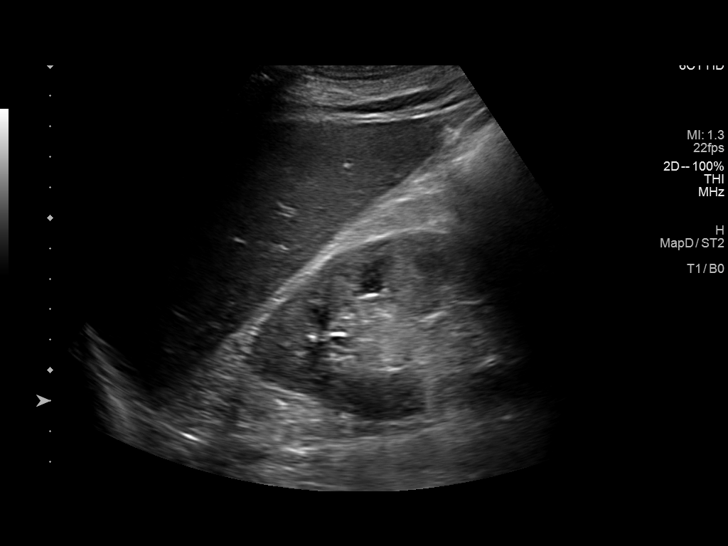
[im 4/44]
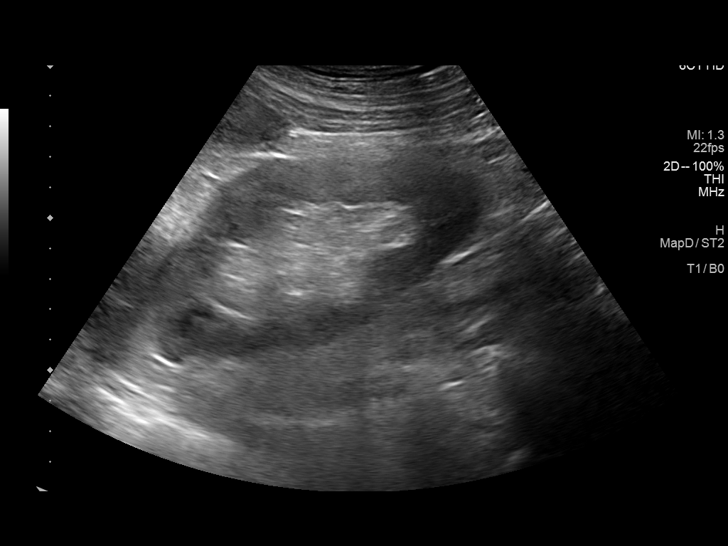
[im 8/44]
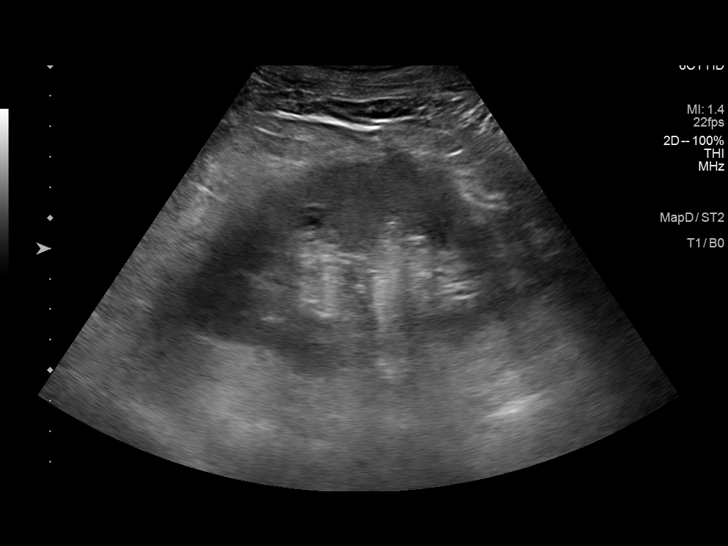
[im 11/44]
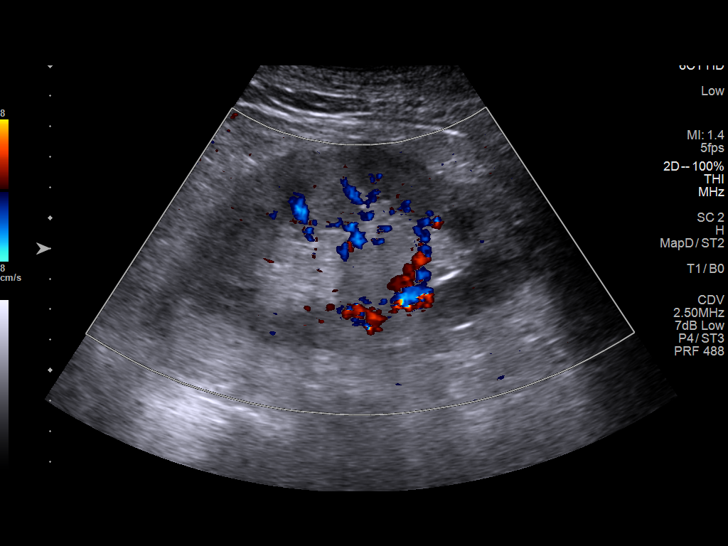
[im 15/44]
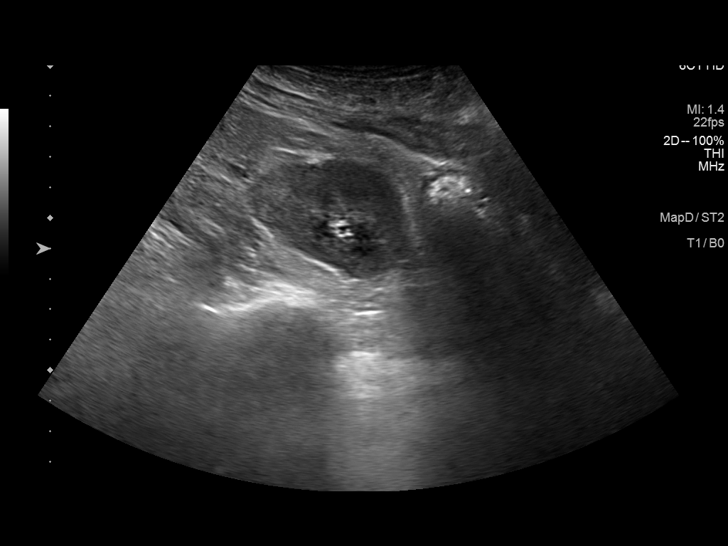
[im 17/44]
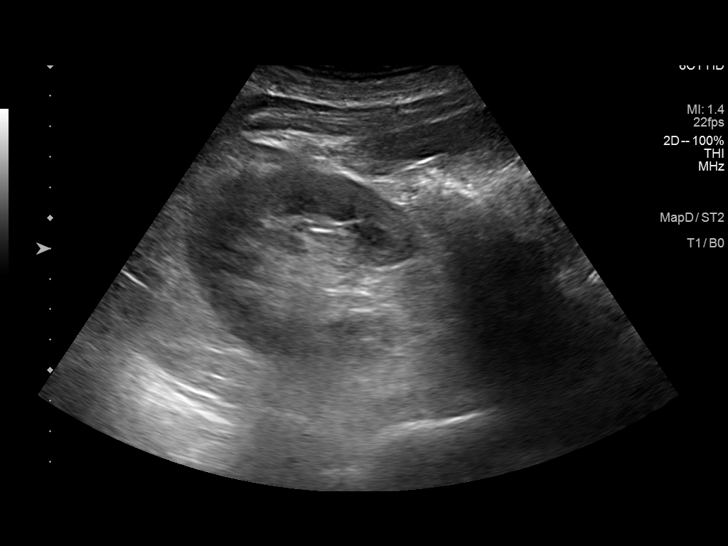
[im 20/44]
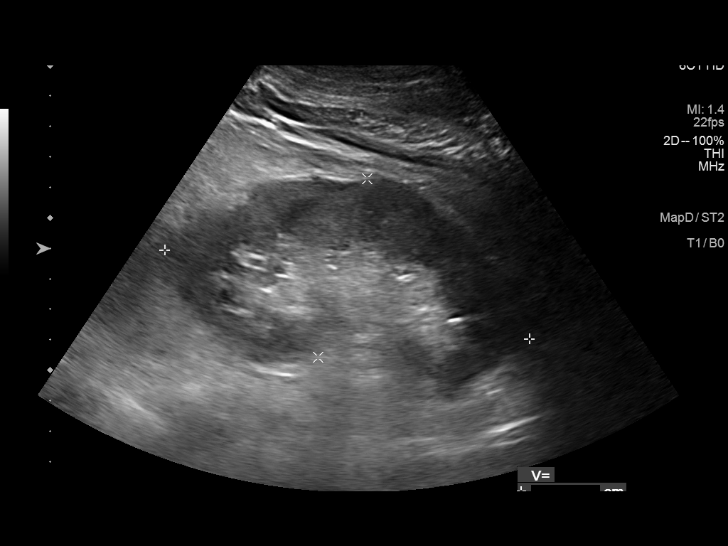
[im 24/44]
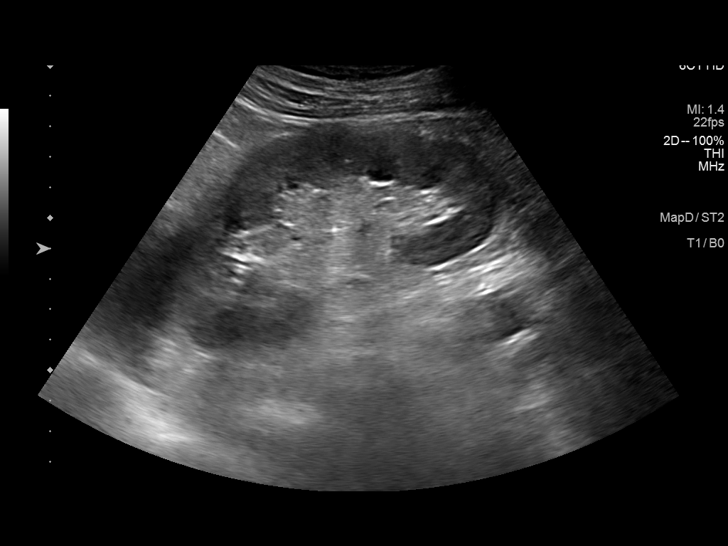
[im 27/44]
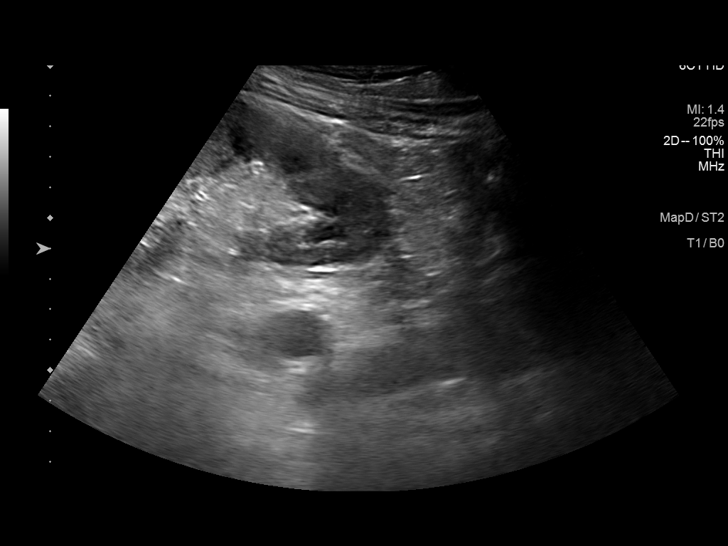
[im 29/44]
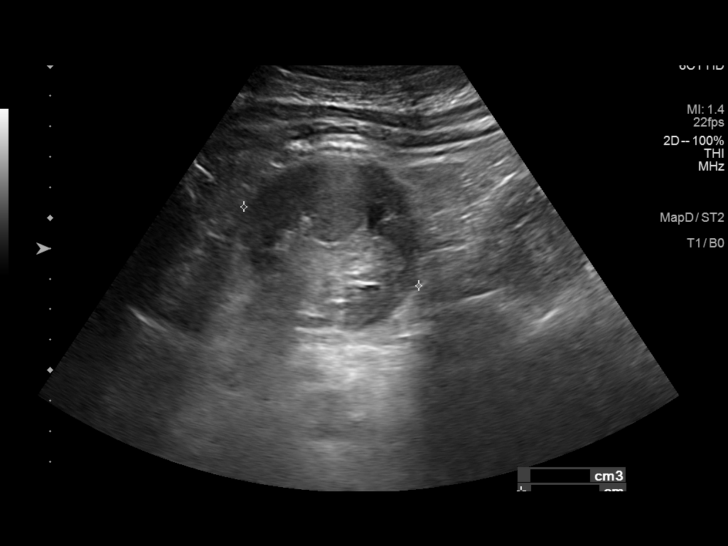
[im 33/44]
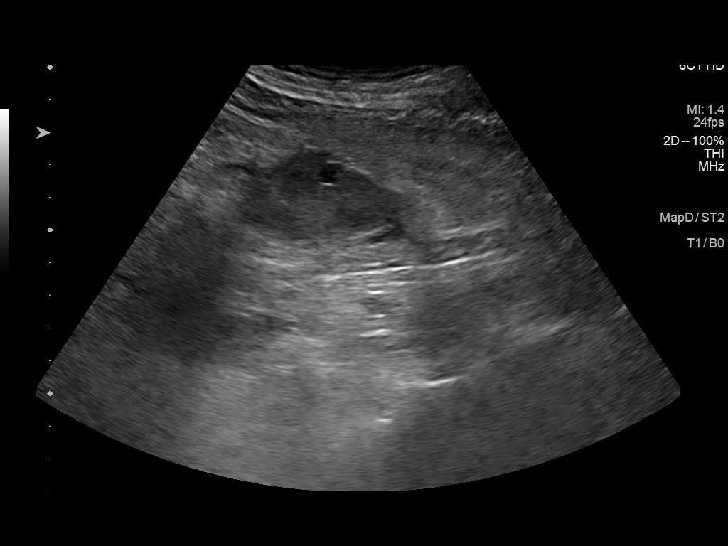
[im 36/44]
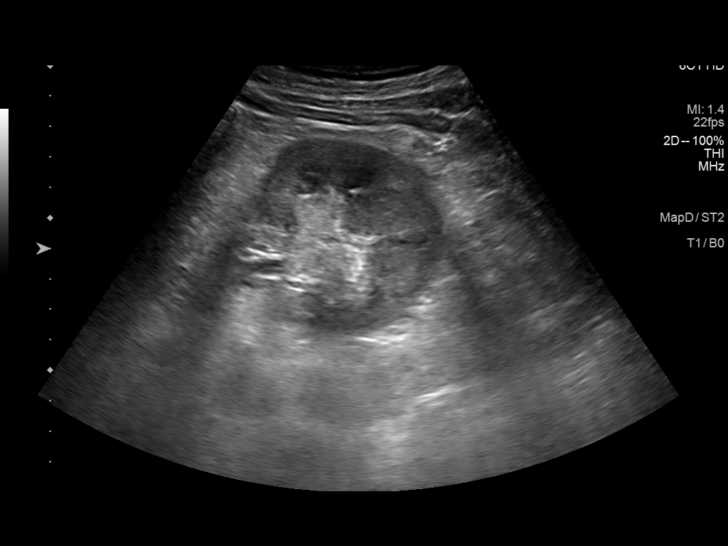
[im 40/44]
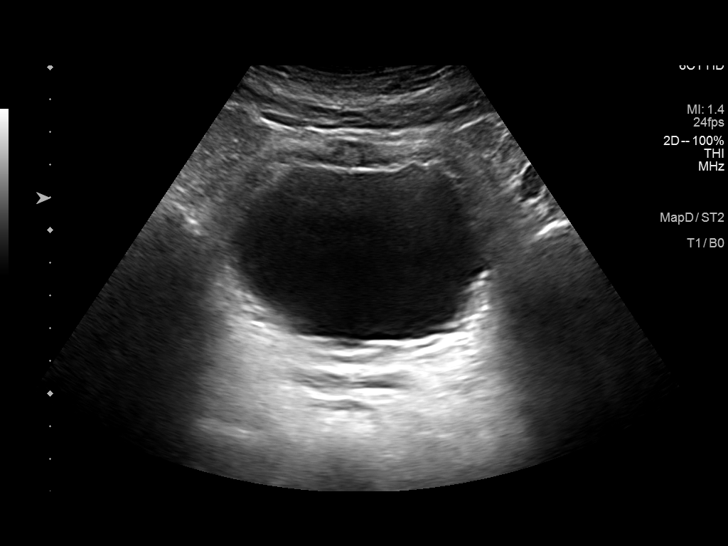
[im 44/44]
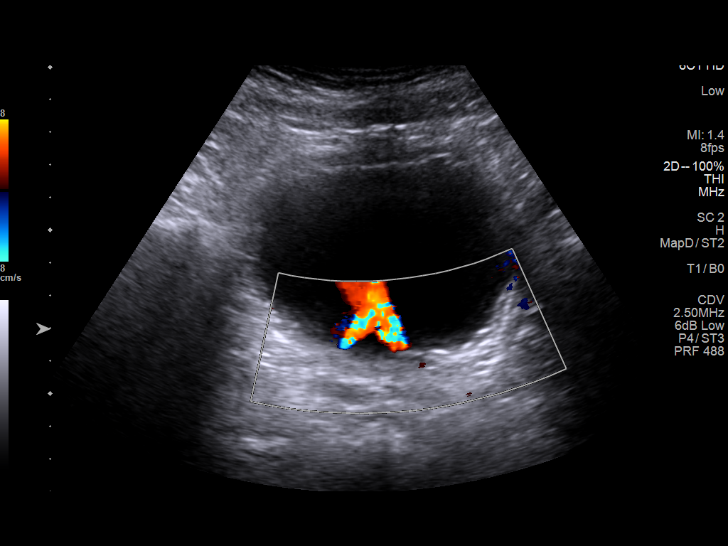

[14 of 25 positions shown; findings below may reference images not displayed]

FINDINGS: Right Kidney:

Renal measurements: 12.2 x 6.2 x 6.5 cm = volume: 257.4 mL. Normal
renal cortical thickness. Mild diffuse increased echogenicity
suggesting medical renal disease. No focal lesions or
hydronephrosis.

Left Kidney:

Renal measurements: 12.3 x 6.1 x 6.3 cm = volume: 246.7 mL. Normal
renal cortical thickness. Mild diffuse increased echogenicity
consistent with medical renal disease. No worrisome lesions or
hydronephrosis. A simple appearing 7 mm cyst is noted.

Bladder:

Appears normal for degree of bladder distention. Bilateral ureteral
jets are noted.

Other:

None.
IMPRESSION: 1. Normal renal cortical thickness but slight increased echogenicity
of both kidneys suggesting medical renal disease.
2. No worrisome renal lesions or hydronephrosis
3. Normal bladder.

## 2020-07-29 ENCOUNTER — Encounter (HOSPITAL_COMMUNITY)
Admission: RE | Admit: 2020-07-29 | Discharge: 2020-07-29 | Disposition: A | Discharge status: Home / Self Care | Payer: BC Managed Care – PPO | Source: Ambulatory Visit | Attending: Nephrology | Admitting: Nephrology

## 2020-07-29 ENCOUNTER — Other Ambulatory Visit: Payer: Self-pay

## 2020-07-29 DIAGNOSIS — N184 Chronic kidney disease, stage 4 (severe): Secondary | ICD-10-CM | POA: Diagnosis not present

## 2020-07-29 DIAGNOSIS — D631 Anemia in chronic kidney disease: Secondary | ICD-10-CM | POA: Diagnosis not present

## 2020-07-29 MED ORDER — SODIUM CHLORIDE 0.9 % IV SOLN
510.0000 mg | Freq: Once | INTRAVENOUS | Status: AC
  Administered 2020-07-29: 510 mg via INTRAVENOUS
  Filled 2020-07-29: qty 510

## 2020-08-05 ENCOUNTER — Encounter (HOSPITAL_COMMUNITY): Payer: BC Managed Care – PPO

## 2020-08-19 ENCOUNTER — Other Ambulatory Visit: Payer: Self-pay

## 2020-08-19 ENCOUNTER — Encounter (HOSPITAL_COMMUNITY)
Admission: RE | Admit: 2020-08-19 | Discharge: 2020-08-19 | Disposition: A | Discharge status: Home / Self Care | Payer: BC Managed Care – PPO | Source: Ambulatory Visit | Attending: Nephrology | Admitting: Nephrology

## 2020-08-19 VITALS — BP 164/85 | HR 75 | Temp 97.7°F | Resp 20

## 2020-08-19 DIAGNOSIS — E113513 Type 2 diabetes mellitus with proliferative diabetic retinopathy with macular edema, bilateral: Secondary | ICD-10-CM | POA: Diagnosis not present

## 2020-08-19 DIAGNOSIS — N184 Chronic kidney disease, stage 4 (severe): Secondary | ICD-10-CM | POA: Insufficient documentation

## 2020-08-19 DIAGNOSIS — D631 Anemia in chronic kidney disease: Secondary | ICD-10-CM | POA: Diagnosis not present

## 2020-08-19 LAB — IRON AND TIBC
Iron: 60 ug/dL (ref 45–182)
Saturation Ratios: 24 % (ref 17.9–39.5)
TIBC: 252 ug/dL (ref 250–450)
UIBC: 192 ug/dL

## 2020-08-19 LAB — FERRITIN: Ferritin: 561 ng/mL — ABNORMAL HIGH (ref 24–336)

## 2020-08-19 LAB — POCT HEMOGLOBIN-HEMACUE: Hemoglobin: 11.3 g/dL — ABNORMAL LOW (ref 13.0–17.0)

## 2020-08-19 MED ORDER — EPOETIN ALFA-EPBX 10000 UNIT/ML IJ SOLN
10000.0000 [IU] | INTRAMUSCULAR | Status: DC
  Administered 2020-08-19: 12:00:00 10000 [IU] via SUBCUTANEOUS

## 2020-08-19 MED ORDER — EPOETIN ALFA-EPBX 10000 UNIT/ML IJ SOLN
INTRAMUSCULAR | Status: AC
  Filled 2020-08-19: qty 1

## 2020-09-02 ENCOUNTER — Other Ambulatory Visit: Payer: Self-pay

## 2020-09-02 ENCOUNTER — Encounter (HOSPITAL_COMMUNITY)
Admission: RE | Admit: 2020-09-02 | Discharge: 2020-09-02 | Disposition: A | Discharge status: Home / Self Care | Payer: BC Managed Care – PPO | Source: Ambulatory Visit | Attending: Nephrology | Admitting: Nephrology

## 2020-09-02 VITALS — BP 155/80 | HR 73 | Temp 97.8°F | Resp 20

## 2020-09-02 DIAGNOSIS — D631 Anemia in chronic kidney disease: Secondary | ICD-10-CM | POA: Diagnosis not present

## 2020-09-02 DIAGNOSIS — N184 Chronic kidney disease, stage 4 (severe): Secondary | ICD-10-CM

## 2020-09-02 LAB — POCT HEMOGLOBIN-HEMACUE: Hemoglobin: 12.2 g/dL — ABNORMAL LOW (ref 13.0–17.0)

## 2020-09-02 MED ORDER — EPOETIN ALFA-EPBX 10000 UNIT/ML IJ SOLN: 10000.0000 [IU] | INTRAMUSCULAR | Status: DC

## 2020-09-08 DIAGNOSIS — N185 Chronic kidney disease, stage 5: Secondary | ICD-10-CM | POA: Diagnosis not present

## 2020-09-09 DIAGNOSIS — I120 Hypertensive chronic kidney disease with stage 5 chronic kidney disease or end stage renal disease: Secondary | ICD-10-CM | POA: Diagnosis not present

## 2020-09-09 DIAGNOSIS — R809 Proteinuria, unspecified: Secondary | ICD-10-CM | POA: Diagnosis not present

## 2020-09-09 DIAGNOSIS — D631 Anemia in chronic kidney disease: Secondary | ICD-10-CM | POA: Diagnosis not present

## 2020-09-09 DIAGNOSIS — N185 Chronic kidney disease, stage 5: Secondary | ICD-10-CM | POA: Diagnosis not present

## 2020-09-16 ENCOUNTER — Other Ambulatory Visit: Payer: Self-pay

## 2020-09-16 ENCOUNTER — Encounter (HOSPITAL_COMMUNITY)
Admission: RE | Admit: 2020-09-16 | Discharge: 2020-09-16 | Disposition: A | Discharge status: Home / Self Care | Payer: BC Managed Care – PPO | Source: Ambulatory Visit | Attending: Nephrology | Admitting: Nephrology

## 2020-09-16 VITALS — BP 114/73 | HR 79 | Temp 97.4°F | Resp 18

## 2020-09-16 DIAGNOSIS — D631 Anemia in chronic kidney disease: Secondary | ICD-10-CM | POA: Insufficient documentation

## 2020-09-16 DIAGNOSIS — N184 Chronic kidney disease, stage 4 (severe): Secondary | ICD-10-CM | POA: Diagnosis not present

## 2020-09-16 DIAGNOSIS — E113513 Type 2 diabetes mellitus with proliferative diabetic retinopathy with macular edema, bilateral: Secondary | ICD-10-CM | POA: Diagnosis not present

## 2020-09-16 LAB — IRON AND TIBC
Iron: 45 ug/dL (ref 45–182)
Saturation Ratios: 20 % (ref 17.9–39.5)
TIBC: 230 ug/dL — ABNORMAL LOW (ref 250–450)
UIBC: 185 ug/dL

## 2020-09-16 LAB — FERRITIN: Ferritin: 1121 ng/mL — ABNORMAL HIGH (ref 24–336)

## 2020-09-16 LAB — POCT HEMOGLOBIN-HEMACUE: Hemoglobin: 14.1 g/dL (ref 13.0–17.0)

## 2020-09-16 MED ORDER — EPOETIN ALFA-EPBX 10000 UNIT/ML IJ SOLN: 10000.0000 [IU] | INTRAMUSCULAR | Status: DC

## 2020-09-23 ENCOUNTER — Ambulatory Visit (INDEPENDENT_AMBULATORY_CARE_PROVIDER_SITE_OTHER): Payer: BC Managed Care – PPO | Admitting: Allergy

## 2020-09-23 ENCOUNTER — Other Ambulatory Visit: Payer: Self-pay

## 2020-09-23 ENCOUNTER — Encounter: Payer: Self-pay | Admitting: Allergy

## 2020-09-23 VITALS — BP 162/70 | HR 74 | Temp 97.9°F | Resp 16

## 2020-09-23 DIAGNOSIS — J301 Allergic rhinitis due to pollen: Secondary | ICD-10-CM | POA: Diagnosis not present

## 2020-09-23 DIAGNOSIS — J4540 Moderate persistent asthma, uncomplicated: Secondary | ICD-10-CM

## 2020-09-23 DIAGNOSIS — H1013 Acute atopic conjunctivitis, bilateral: Secondary | ICD-10-CM | POA: Diagnosis not present

## 2020-09-23 MED ORDER — BEPOTASTINE BESILATE 1.5 % OP SOLN: 1.0000 [drp] | Freq: Every day | OPHTHALMIC | 5 refills | Status: DC | PRN

## 2020-09-23 NOTE — Progress Notes (Signed)
Follow-up Note  RE: Benjamin [name] MRN: [id number] DOB: 07-25-1956 Date of Office Visit: 09/23/2020   History of present illness: Benjamin [name] is a 65 y.o. male presenting today for follow-up of allergic rhinitis with conjunctivitis, and asthma.  He was last seen in the office on 06/24/2020 by myself.  He states that the nasal Atrovent works better than the Astelin did and nasal drainage control but he still has a lot of nasal drainage to the point that he feels like it is affecting his taste.  He uses the Atrovent spray 2 to 3 sprays in the morning and at night.  He states he did get over-the-counter Pataday and it does not help with his watery eyes.  He does follow with a retina specialist and has been getting injections into his eye every 2 weeks or so that does leave his right eye bloodshot in appearance.  He states he did asked with his retina specialist about eyedrops he could use for the wateriness and he recommended he try artificial tears.  He states the artificial tears also did not seem to help with the wateriness of his eyes.  He does continue on montelukast.  He states his asthma has been under very good control and he has not needed to use his rescue inhaler since starting on Symbicort that he does 2 puffs twice a day.  Has not had any ED or urgent care visits or any systemic steroid needs.   Review of systems: Review of Systems  Constitutional: Negative.   HENT:       See HPI  Eyes:       See HPI  Respiratory: Negative.   Cardiovascular: Negative.   Gastrointestinal: Negative.   Musculoskeletal: Negative.   Skin: Negative.   Neurological: Negative.     All other systems negative unless noted above in HPI  Past medical/social/surgical/family history have been reviewed and are unchanged unless specifically indicated below.  No changes  Medication List: Current Outpatient Medications  Medication Sig Dispense Refill  . albuterol (VENTOLIN HFA) 108 (90 Base)  MCG/ACT inhaler Can inhale two puffs every four to six hours as needed for cough, wheeze, shortness of breath, or chest tightness. 8.5 g 1  . Bepotastine Besilate (BEPREVE) 1.5 % SOLN Place 1 drop into both eyes daily as needed. 5 mL 5  . budesonide-formoterol (SYMBICORT) 160-4.5 MCG/ACT inhaler Inhale 2 puffs into the lungs 2 (two) times daily. Rinse, gargle and spit after use. 1 each 5  . furosemide (LASIX) 40 MG tablet Take by mouth. Three times a week    . ipratropium (ATROVENT) 0.06 % nasal spray Place 2 sprays into both nostrils 2 (two) times daily. 15 mL 5  . levalbuterol (XOPENEX HFA) 45 MCG/ACT inhaler Can inhale two puffs every four to six hours as needed for cough or wheeze. 15 g 1  . sevelamer carbonate (RENVELA) 800 MG tablet Take 1 tablet (800 mg total) by mouth 3 (three) times daily with meals. 90 tablet 1  . sodium bicarbonate 650 MG tablet Take 1 tablet (650 mg total) by mouth 2 (two) times daily. 60 tablet 1  . Vitamin D, Ergocalciferol, (DRISDOL) 1.25 MG (50000 UNIT) CAPS capsule Take by mouth.    . hydrALAZINE (APRESOLINE) 50 MG tablet Take 1 tablet (50 mg total) by mouth every 8 (eight) hours. (Patient not taking: Reported on 09/23/2020) 90 tablet 1  . montelukast (SINGULAIR) 10 MG tablet Take 1 tablet (10 mg total) by mouth at  bedtime. (Patient not taking: Reported on 09/23/2020) 30 tablet 5   No current facility-administered medications for this visit.     Known medication allergies: Allergies  Allergen Reactions  . Grass Pollen(K-O-R-T-Swt Vern) Other (See Comments)    Eyes run, shortness of breath, itching      Physical examination: Blood pressure (!) 162/70, pulse 74, temperature 97.9 F (36.6 C), temperature source Temporal, resp. rate 16, SpO2 97 %.  General: Alert, interactive, in no acute distress. HEENT: right scleral injection, PERRLA,  TMs pearly gray, turbinates non-edematous without discharge, post-pharynx non erythematous. Neck: Supple without  lymphadenopathy. Lungs: Clear to auscultation without wheezing, rhonchi or rales. {no increased work of breathing. CV: Normal S1, S2 without murmurs. Abdomen: Nondistended, nontender. Skin: Warm and dry, without lesions or rashes. Extremities:  No clubbing, cyanosis or edema. Neuro:   Grossly intact.  Diagnositics/Labs: None today  Assessment and plan: Allergic rhinitis with conjunctivitis  - continue avoidance measures for grass pollen, tree pollen, weed pollen   - continue nasal Atrovent 2 sprays each nostril 2-3 times a day (may add in a midday dose if still having nasal drainage).  - if still having nasal drainage with use of Atrovent 3 times a day then let us know and will refill nasal Astelin for use with nasal Atrovent.   Atrovent in a nasal anticholinergic medication and Astelin is a nasal antihistamine and can be used together if needed for more nasal drainage control drainage  - for watery or itchy eyes use try Bepreve 1 drop each eye twice a day as needed for watery, itchy eyes   - can use lubricating eye drop like artificial tears to help with moisturizing eye  - continue Montelukast 10mg  daily at bedtime.  Reactive airway  - have access to Xopenex inhaler 2 puffs every 4-6 hours as needed for cough/wheeze/shortness of breath/chest tightness.  May use 15-20 minutes prior to activity.   Monitor frequency of use.    - continue Symbicort 17mcg 2 puffs twice day.  This is a controller inhaler to help reduce your daily symptoms and reduce Xopenex use  - Montelukast as above.    Control goals:   Full participation in all desired activities (may need albuterol before activity)  Albuterol use two time or less a week on average (not counting use with activity)  Cough interfering with sleep two time or less a month  Oral steroids no more than once a year  No hospitalizations  Follow-up in 4-82months or sooner if needed    No follow-ups on file.  I appreciate the  opportunity to take part in Abdiel's care. Please do not hesitate to contact me with questions.  Sincerely,   Prudy Feeler, MD Allergy/Immunology [facility]

## 2020-09-23 NOTE — Patient Instructions (Addendum)
Allergies  - continue avoidance measures for grass pollen, tree pollen, weed pollen   - continue nasal Atrovent 2 sprays each nostril 2-3 times a day (may add in a midday dose if still having nasal drainage).  - if still having nasal drainage with use of Atrovent 3 times a day then let us know and will refill nasal Astelin for use with nasal Atrovent.   Atrovent in a nasal anticholinergic medication and Astelin is a nasal antihistamine and can be used together if needed for more nasal drainage control drainage  - for watery or itchy eyes use try Bepreve 1 drop each eye twice a day as needed for watery, itchy eyes   - can use lubricating eye drop like artificial tears to help with moisturizing eye  - continue Montelukast 10mg  daily at bedtime.  Reactive airway  - have access to Xopenex inhaler 2 puffs every 4-6 hours as needed for cough/wheeze/shortness of breath/chest tightness.  May use 15-20 minutes prior to activity.   Monitor frequency of use.    - continue Symbicort 12mcg 2 puffs twice day.  This is a controller inhaler to help reduce your daily symptoms and reduce Xopenex use  - Montelukast as above.    Control goals:   Full participation in all desired activities (may need albuterol before activity)  Albuterol use two time or less a week on average (not counting use with activity)  Cough interfering with sleep two time or less a month  Oral steroids no more than once a year  No hospitalizations  Follow-up in 4-61months or sooner if needed

## 2020-09-30 ENCOUNTER — Encounter (HOSPITAL_COMMUNITY)
Admission: RE | Admit: 2020-09-30 | Discharge: 2020-09-30 | Disposition: A | Discharge status: Home / Self Care | Payer: BC Managed Care – PPO | Source: Ambulatory Visit | Attending: Nephrology | Admitting: Nephrology

## 2020-09-30 ENCOUNTER — Other Ambulatory Visit: Payer: Self-pay

## 2020-09-30 VITALS — BP 147/87 | HR 73 | Temp 96.9°F | Resp 18

## 2020-09-30 DIAGNOSIS — D631 Anemia in chronic kidney disease: Secondary | ICD-10-CM

## 2020-09-30 DIAGNOSIS — N184 Chronic kidney disease, stage 4 (severe): Secondary | ICD-10-CM | POA: Diagnosis not present

## 2020-09-30 LAB — POCT HEMOGLOBIN-HEMACUE: Hemoglobin: 12.9 g/dL — ABNORMAL LOW (ref 13.0–17.0)

## 2020-09-30 MED ORDER — EPOETIN ALFA-EPBX 10000 UNIT/ML IJ SOLN: 10000.0000 [IU] | INTRAMUSCULAR | Status: DC

## 2020-10-07 DIAGNOSIS — I120 Hypertensive chronic kidney disease with stage 5 chronic kidney disease or end stage renal disease: Secondary | ICD-10-CM | POA: Diagnosis not present

## 2020-10-07 DIAGNOSIS — R809 Proteinuria, unspecified: Secondary | ICD-10-CM | POA: Diagnosis not present

## 2020-10-07 DIAGNOSIS — D631 Anemia in chronic kidney disease: Secondary | ICD-10-CM | POA: Diagnosis not present

## 2020-10-07 DIAGNOSIS — N185 Chronic kidney disease, stage 5: Secondary | ICD-10-CM | POA: Diagnosis not present

## 2020-10-14 ENCOUNTER — Encounter (HOSPITAL_COMMUNITY): Payer: BC Managed Care – PPO

## 2020-10-14 DIAGNOSIS — H2513 Age-related nuclear cataract, bilateral: Secondary | ICD-10-CM | POA: Diagnosis not present

## 2020-10-14 DIAGNOSIS — H35033 Hypertensive retinopathy, bilateral: Secondary | ICD-10-CM | POA: Diagnosis not present

## 2020-10-14 DIAGNOSIS — E113513 Type 2 diabetes mellitus with proliferative diabetic retinopathy with macular edema, bilateral: Secondary | ICD-10-CM | POA: Diagnosis not present

## 2020-10-14 DIAGNOSIS — H43823 Vitreomacular adhesion, bilateral: Secondary | ICD-10-CM | POA: Diagnosis not present

## 2020-10-28 ENCOUNTER — Encounter (HOSPITAL_COMMUNITY): Payer: BC Managed Care – PPO

## 2020-10-31 DIAGNOSIS — N185 Chronic kidney disease, stage 5: Secondary | ICD-10-CM | POA: Diagnosis not present

## 2020-11-04 ENCOUNTER — Other Ambulatory Visit: Payer: Self-pay | Admitting: Allergy

## 2020-11-06 DIAGNOSIS — I120 Hypertensive chronic kidney disease with stage 5 chronic kidney disease or end stage renal disease: Secondary | ICD-10-CM | POA: Diagnosis not present

## 2020-11-06 DIAGNOSIS — R809 Proteinuria, unspecified: Secondary | ICD-10-CM | POA: Diagnosis not present

## 2020-11-06 DIAGNOSIS — D631 Anemia in chronic kidney disease: Secondary | ICD-10-CM | POA: Diagnosis not present

## 2020-11-06 DIAGNOSIS — N185 Chronic kidney disease, stage 5: Secondary | ICD-10-CM | POA: Diagnosis not present

## 2020-11-11 DIAGNOSIS — E113513 Type 2 diabetes mellitus with proliferative diabetic retinopathy with macular edema, bilateral: Secondary | ICD-10-CM | POA: Diagnosis not present

## 2020-11-11 DIAGNOSIS — H43823 Vitreomacular adhesion, bilateral: Secondary | ICD-10-CM | POA: Diagnosis not present

## 2020-12-09 DIAGNOSIS — E113513 Type 2 diabetes mellitus with proliferative diabetic retinopathy with macular edema, bilateral: Secondary | ICD-10-CM | POA: Diagnosis not present

## 2021-01-01 DIAGNOSIS — N185 Chronic kidney disease, stage 5: Secondary | ICD-10-CM | POA: Diagnosis not present

## 2021-01-07 IMAGING — US US RENAL
1 series · 14 of 25 positions shown · non-contrast
Comparison: 09/20/2019

CLINICAL DATA: Acute kidney injury

EXAM:
RENAL / URINARY TRACT ULTRASOUND COMPLETE

[Series 1: us renal · 14 of 27 slices shown]
[im 1/27]
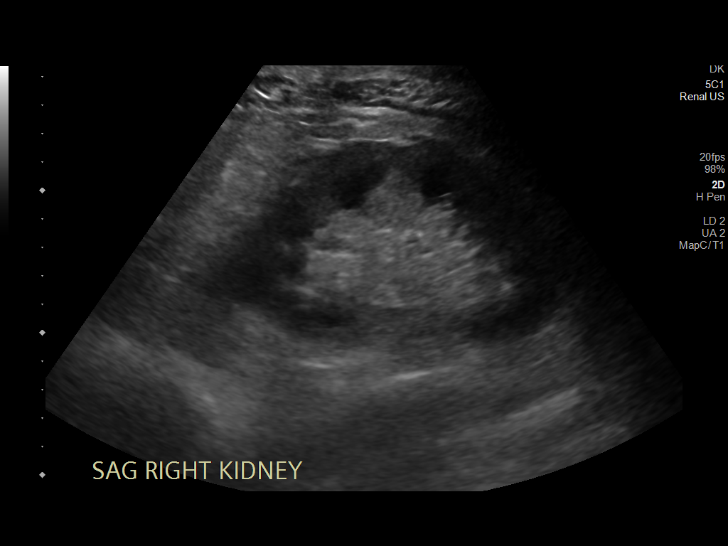
[im 3/27]
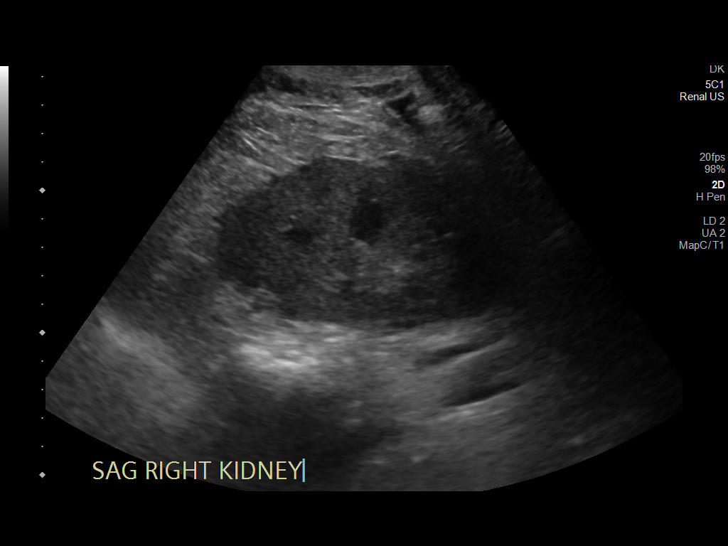
[im 5/27]
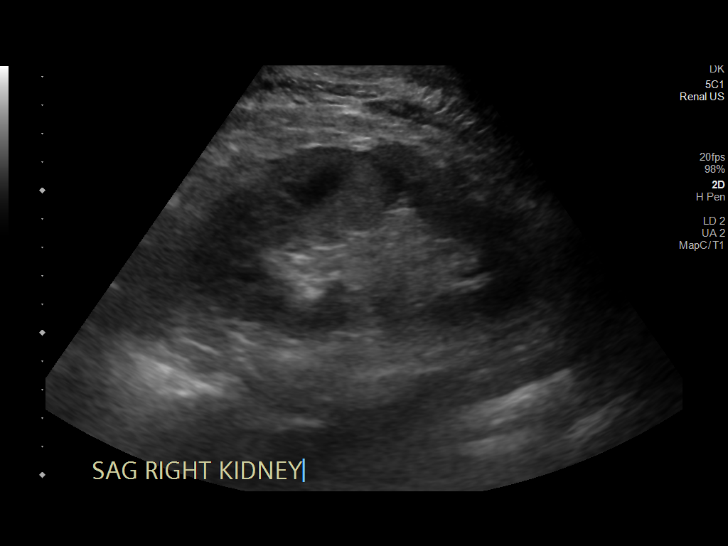
[im 7/27]
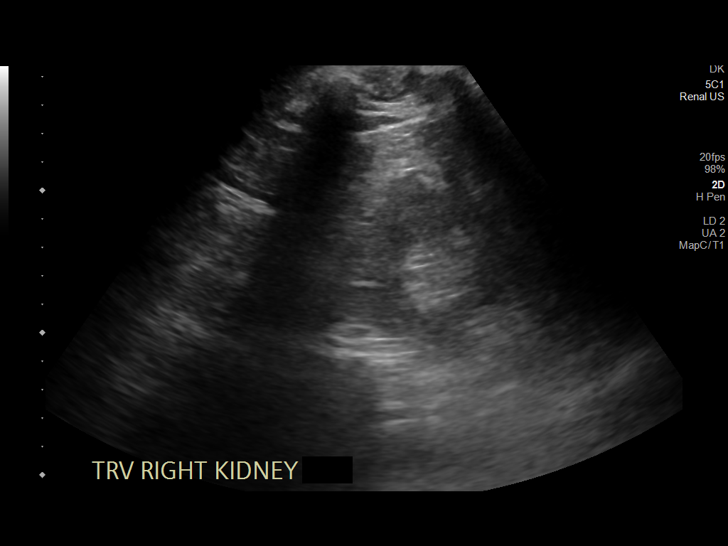
[im 9/27]
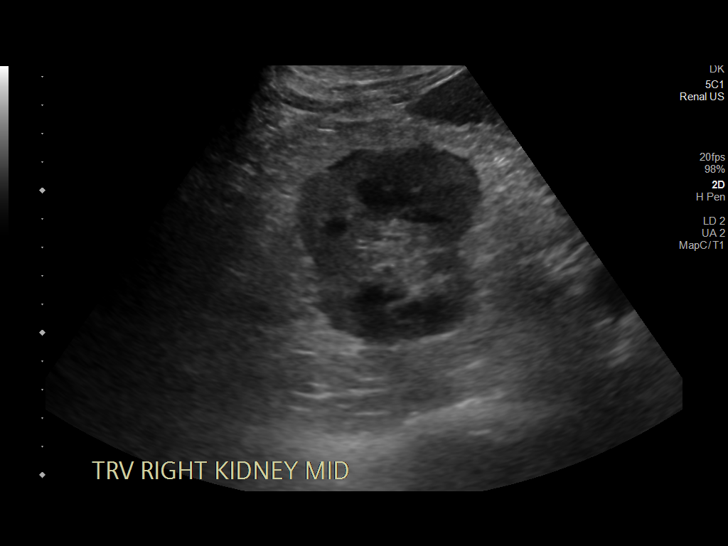
[im 10/27]
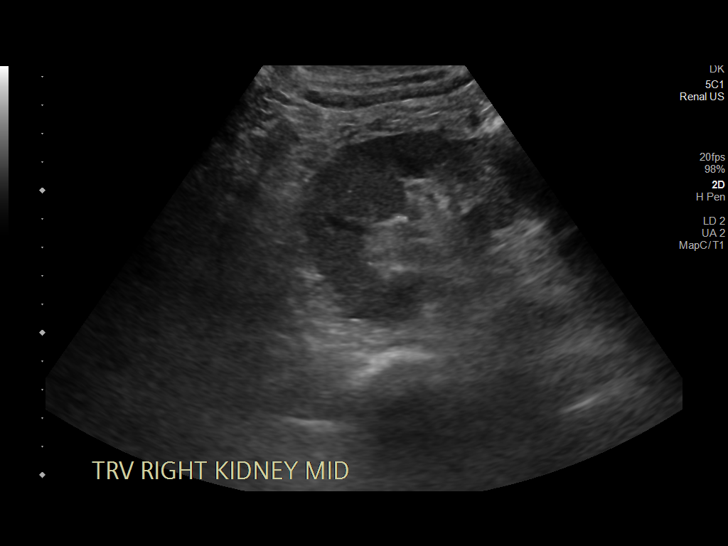
[im 12/27]
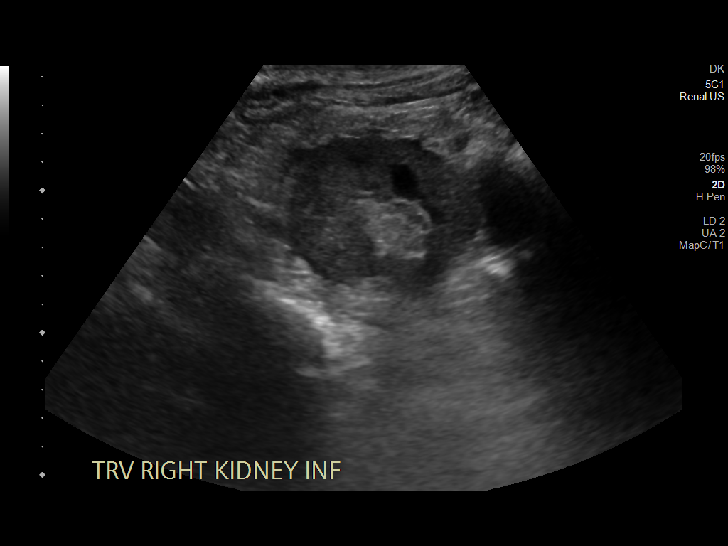
[im 15/27]
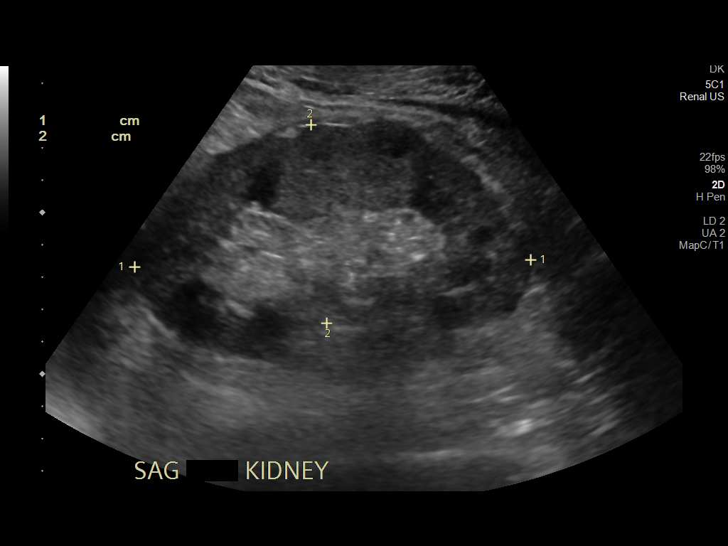
[im 17/27]
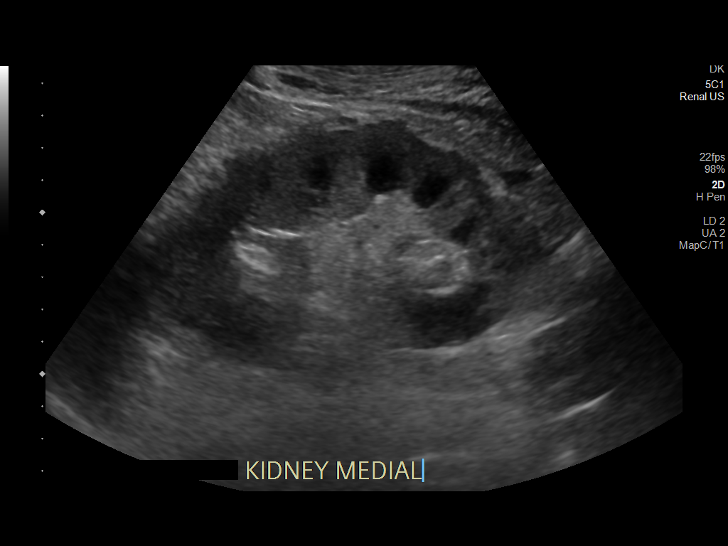
[im 18/27]
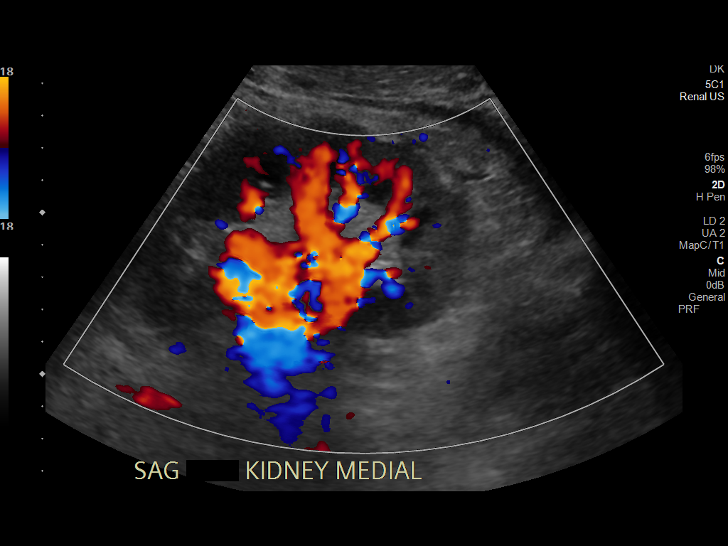
[im 20/27]
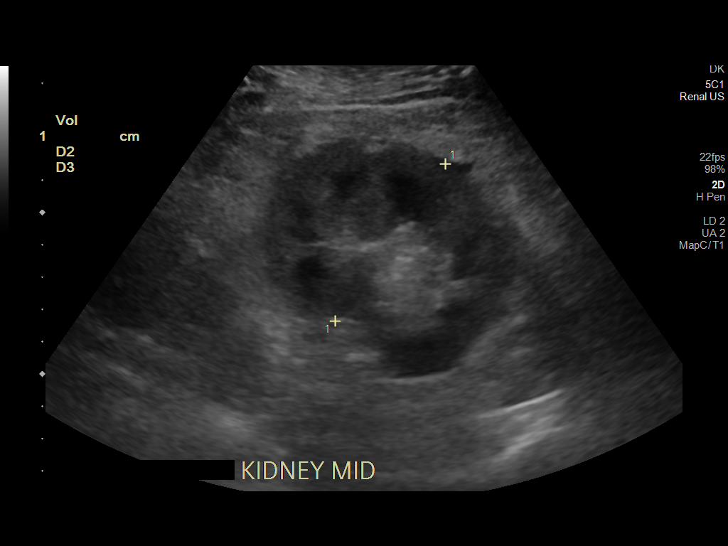
[im 22/27]
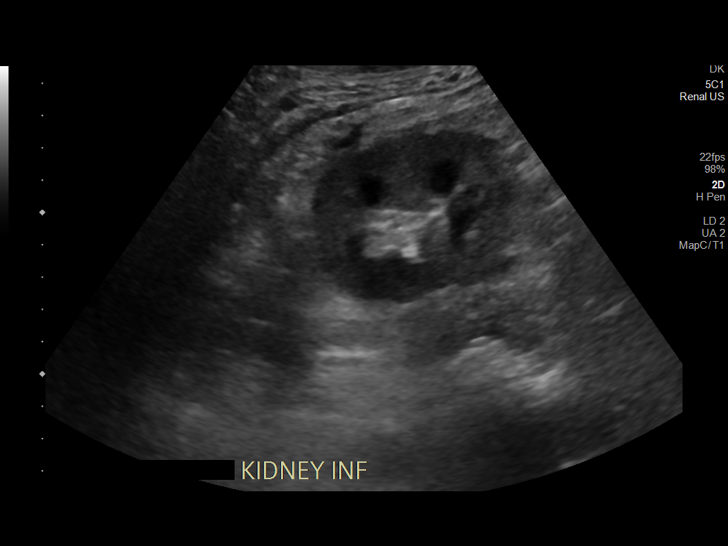
[im 24/27]
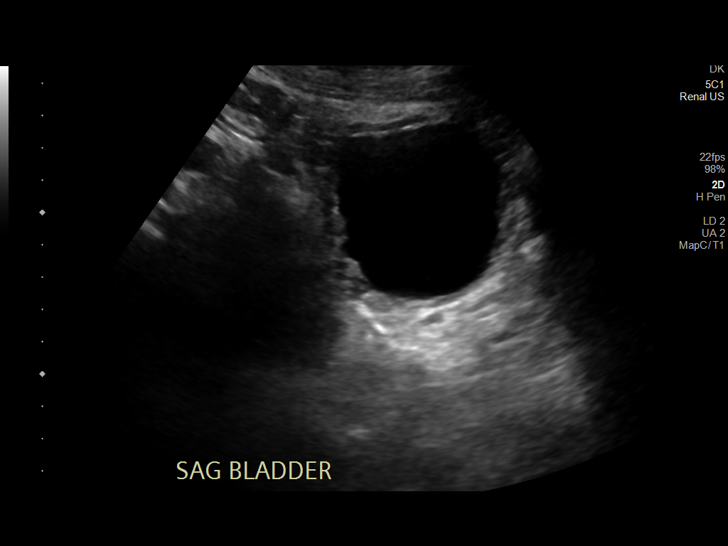
[im 27/27]
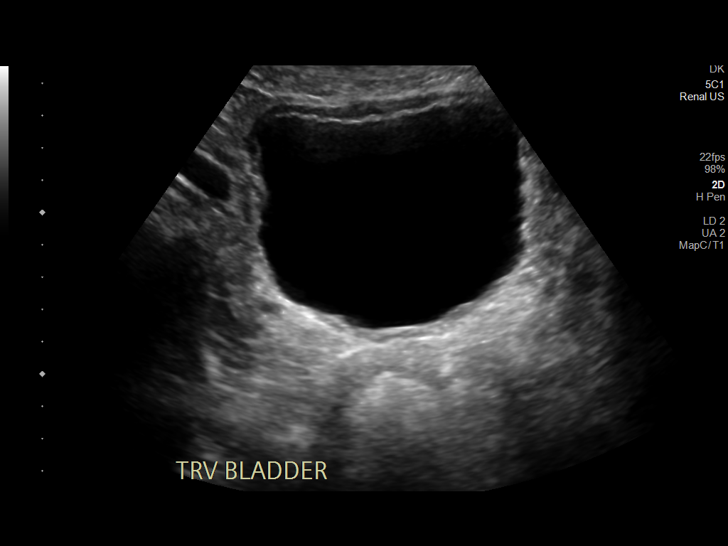

[14 of 25 positions shown; findings below may reference images not displayed]

FINDINGS: Right Kidney:

Renal measurements: 13 x 7 x 6 cm = volume: 260 mL. Cortical
echogenicity is mildly increased. No mass or hydronephrosis
visualized.

Left Kidney:

Renal measurements: 10 x 6 x 6 cm = volume: 190 mL. Cortical
echogenicity is mildly increased. No mass or hydronephrosis
visualized.

Bladder:

Mildly lobulated bladder which is likely from under distension. No
internal debris.
IMPRESSION: 1. No hydronephrosis or other interval change.
2. Prominent cortical echogenicity suggest medical renal disease.

## 2021-01-08 DIAGNOSIS — N184 Chronic kidney disease, stage 4 (severe): Secondary | ICD-10-CM | POA: Diagnosis not present

## 2021-01-08 DIAGNOSIS — I129 Hypertensive chronic kidney disease with stage 1 through stage 4 chronic kidney disease, or unspecified chronic kidney disease: Secondary | ICD-10-CM | POA: Diagnosis not present

## 2021-01-08 DIAGNOSIS — R809 Proteinuria, unspecified: Secondary | ICD-10-CM | POA: Diagnosis not present

## 2021-01-08 DIAGNOSIS — D631 Anemia in chronic kidney disease: Secondary | ICD-10-CM | POA: Diagnosis not present

## 2021-01-13 DIAGNOSIS — E113513 Type 2 diabetes mellitus with proliferative diabetic retinopathy with macular edema, bilateral: Secondary | ICD-10-CM | POA: Diagnosis not present

## 2021-01-13 DIAGNOSIS — H43821 Vitreomacular adhesion, right eye: Secondary | ICD-10-CM | POA: Diagnosis not present

## 2021-01-15 IMAGING — US US BIOPSY
1 series · 8 of 8 positions shown · non-contrast
Comparison: none

INDICATION: Acute on chronic renal failure and need for renal biopsy.

[Series 1: us biopsy (kidney) · 8 of 8 slices shown]
[im 1/8]
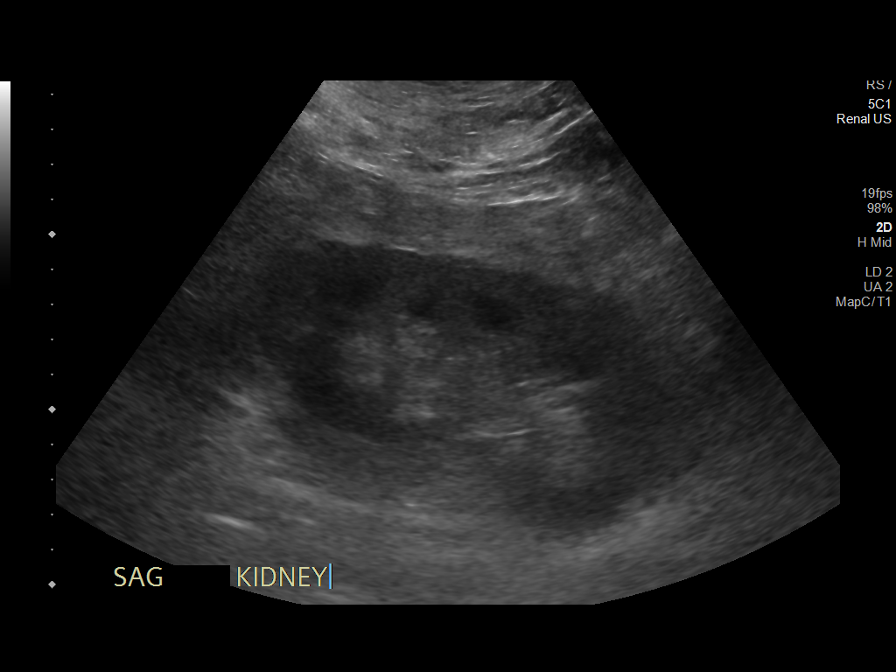
[im 2/8]
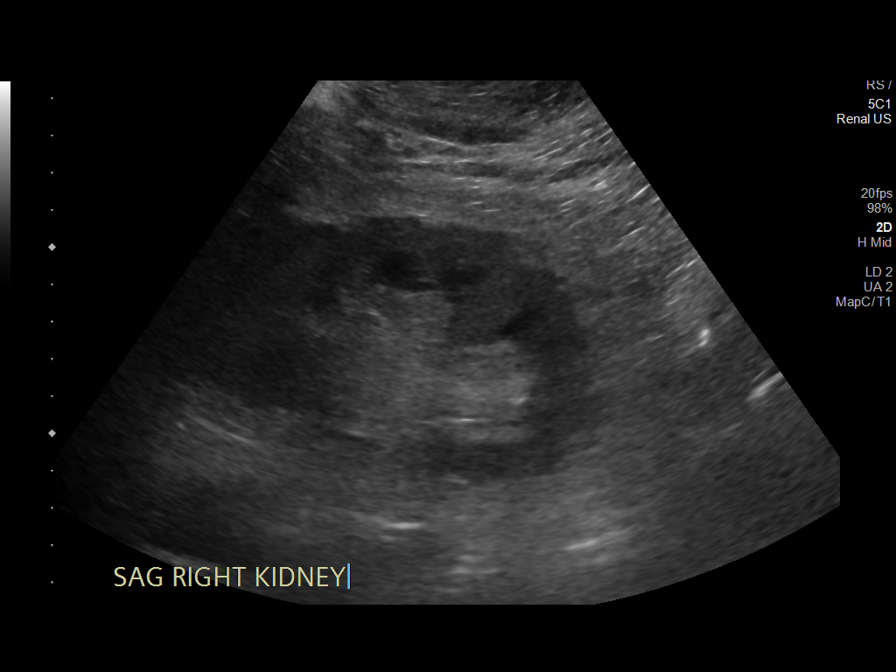
[im 3/8]
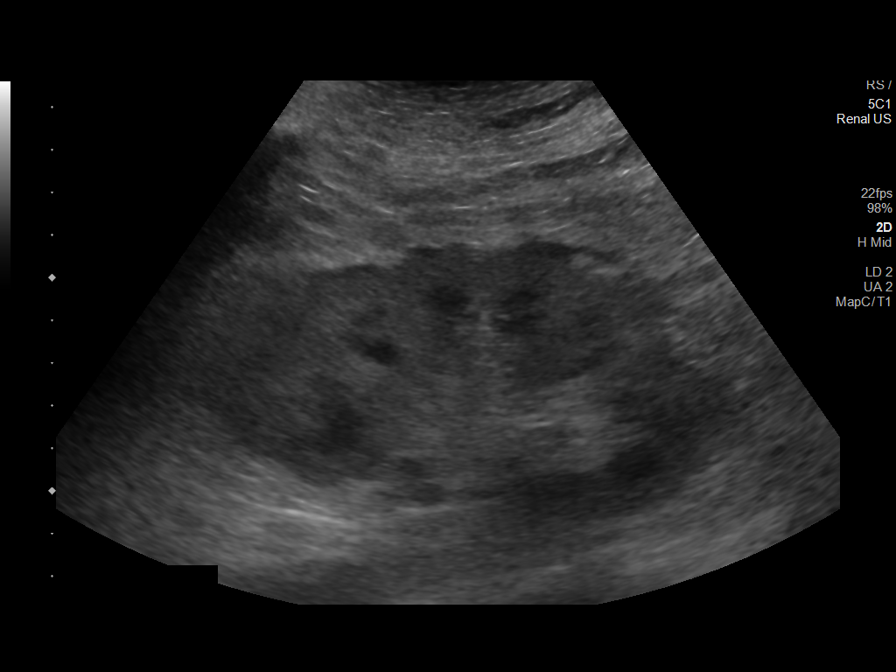
[im 4/8]
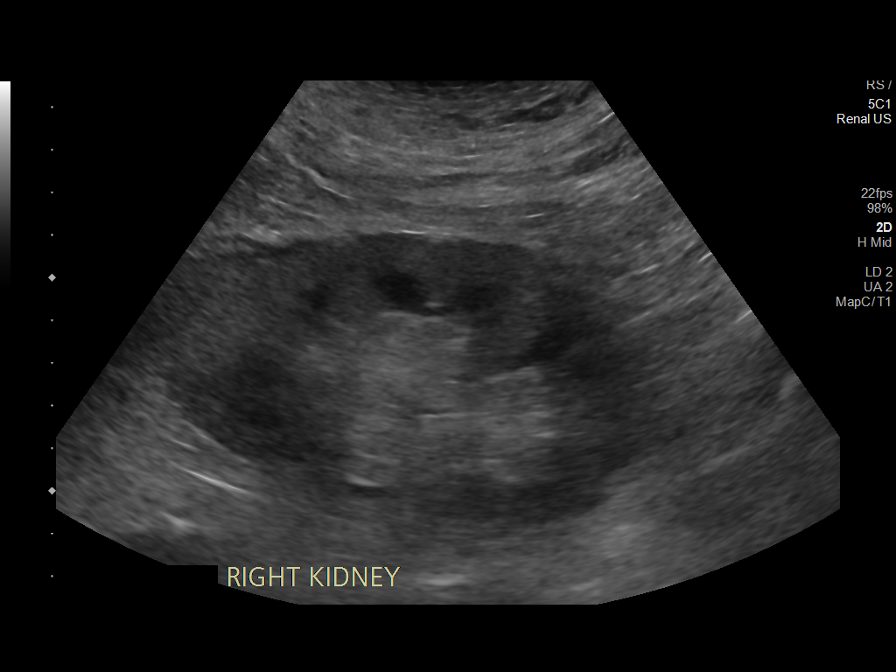
[im 5/8]
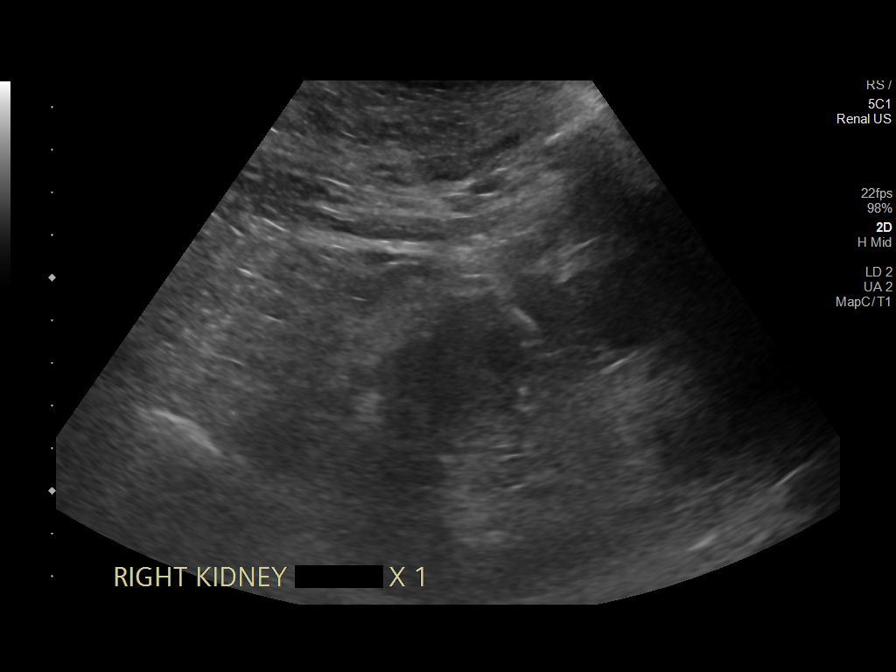
[im 6/8]
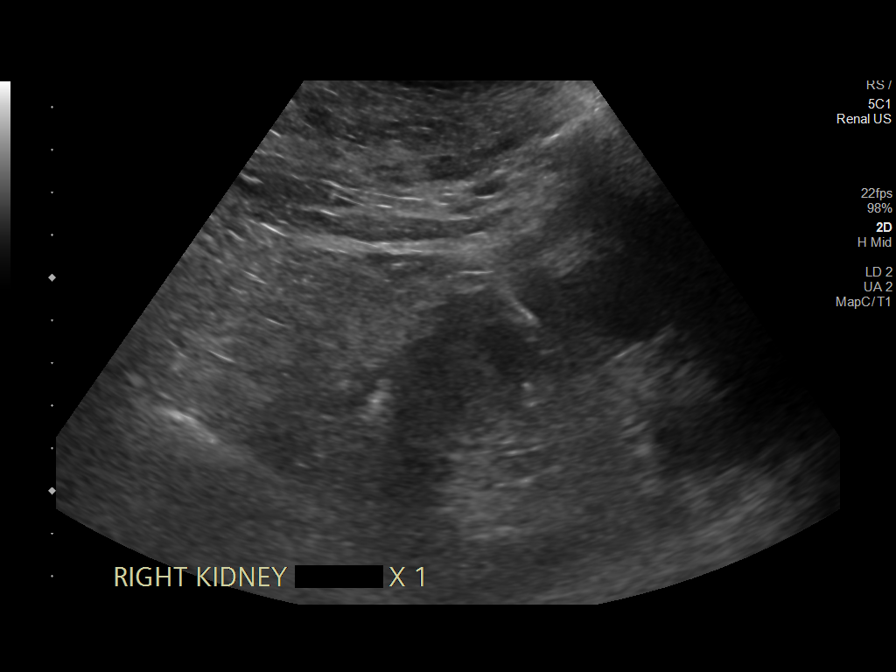
[im 7/8]
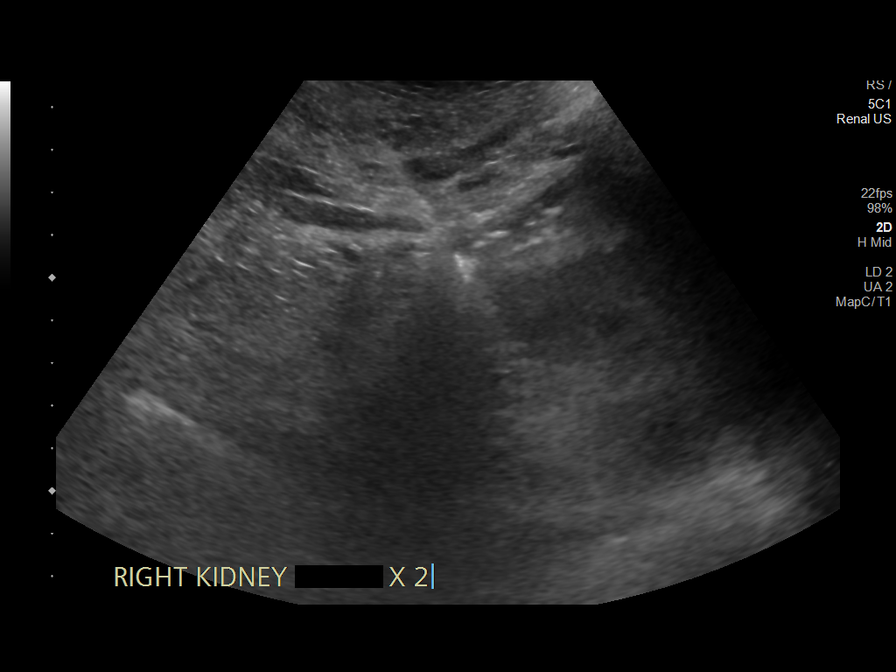
[im 8/8]
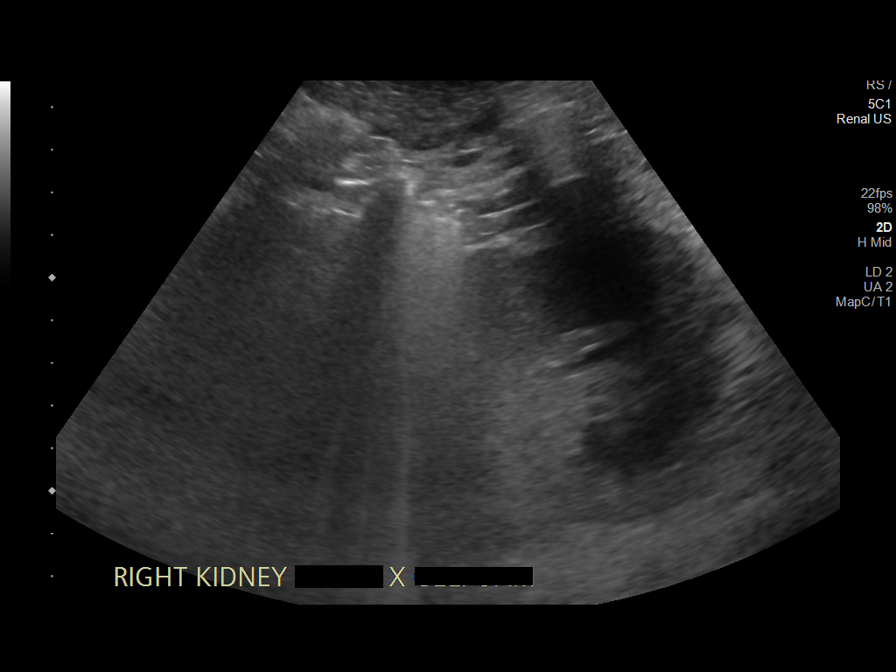

[8 of 8 positions shown; findings below may reference images not displayed]

EXAM:
ULTRASOUND GUIDED CORE BIOPSY OF RIGHT KIDNEY

MEDICATIONS:
None.

ANESTHESIA/SEDATION:
Fentanyl 50 mcg IV; Versed 1.0 mg IV

Moderate Sedation Time:  19 minutes.

The patient was continuously monitored during the procedure by the
interventional radiology nurse under my direct supervision.

PROCEDURE:
The procedure, risks, benefits, and alternatives were explained to
the patient. Questions regarding the procedure were encouraged and
answered. The patient understands and consents to the procedure. A
time-out was performed prior to initiating the procedure.

Ultrasound was performed to localize both kidneys. The right flank
region was prepped with chlorhexidine in a sterile fashion, and a
sterile drape was applied covering the operative field. A sterile
gown and sterile gloves were used for the procedure. Local
anesthesia was provided with 1% Lidocaine.

Under ultrasound guidance, a 15 gauge trocar needle was advanced to
the margin of the lower pole cortex of the right kidney. Two
separate coaxial 16 gauge core biopsy samples were obtained and
submitted in saline. A slurry of Gel-Foam pledgets was then injected
via the outer needle as the needle was retracted and removed.
Additional ultrasound was performed.

COMPLICATIONS:
None immediate.
FINDINGS: Both kidneys are well visualized by ultrasound. The right was chosen
for sampling due to slightly closer proximity to the skin. Solid
core biopsy samples were obtained.
IMPRESSION: Ultrasound-guided core biopsy performed at the level of right lower
pole renal cortex.

## 2021-01-27 DIAGNOSIS — N184 Chronic kidney disease, stage 4 (severe): Secondary | ICD-10-CM | POA: Diagnosis not present

## 2021-02-10 DIAGNOSIS — E113513 Type 2 diabetes mellitus with proliferative diabetic retinopathy with macular edema, bilateral: Secondary | ICD-10-CM | POA: Diagnosis not present

## 2021-02-10 DIAGNOSIS — H35033 Hypertensive retinopathy, bilateral: Secondary | ICD-10-CM | POA: Diagnosis not present

## 2021-02-10 DIAGNOSIS — H43823 Vitreomacular adhesion, bilateral: Secondary | ICD-10-CM | POA: Diagnosis not present

## 2021-02-10 DIAGNOSIS — H2513 Age-related nuclear cataract, bilateral: Secondary | ICD-10-CM | POA: Diagnosis not present

## 2021-02-18 DIAGNOSIS — N184 Chronic kidney disease, stage 4 (severe): Secondary | ICD-10-CM | POA: Diagnosis not present

## 2021-02-24 ENCOUNTER — Encounter: Payer: Self-pay | Admitting: Allergy

## 2021-02-24 ENCOUNTER — Other Ambulatory Visit: Payer: Self-pay

## 2021-02-24 ENCOUNTER — Ambulatory Visit: Payer: BC Managed Care – PPO | Admitting: Allergy

## 2021-02-24 VITALS — BP 130/80 | HR 68 | Resp 16

## 2021-02-24 DIAGNOSIS — J4540 Moderate persistent asthma, uncomplicated: Secondary | ICD-10-CM

## 2021-02-24 DIAGNOSIS — H1013 Acute atopic conjunctivitis, bilateral: Secondary | ICD-10-CM | POA: Diagnosis not present

## 2021-02-24 DIAGNOSIS — J300 Vasomotor rhinitis: Secondary | ICD-10-CM | POA: Diagnosis not present

## 2021-02-24 DIAGNOSIS — J301 Allergic rhinitis due to pollen: Secondary | ICD-10-CM

## 2021-02-24 NOTE — Progress Notes (Signed)
Follow-up Note  RE: Benjamin [name] MRN: [id number] DOB: Sep 15, 1955 Date of Office Visit: 02/24/2021   History of present illness: Benjamin [name] is a 65 y.o. male presenting today for follow-up of allergic rhinitis with conjunctivitis and reactive airway.  He was last seen in the office on 09/23/2020 by myself.  He states he is not really any better since his last visit.  His nasal drainage continues to be unrelenting.  He states it is really affecting his mood.  He states it does seem to be very sensitive to changes in temperature as he does note if he is exposed to colder temps like an air conditioned room his nose can run like a faucet.  However when he has a more milder temperate weather he has more less drainage issue.  He also states his eyes water constantly well.  He states he has seen eye doctors who have recommended lubricating eyedrops that he is using currently.  I did recommend he use Bepreve and send in a prescription for this last time and he states it was over $200.  He is currently using Atrovent 2 sprays in the morning and another dose in the evening.  He thinks that he might can use it more often in the daytime.  He does still have a little bit of the Astelin left at home as well.  He is currently not taking montelukast. He does have access to Xopenex and does continue to use Symbicort 160 mg 2 puffs twice a day.  He has not required any ED or urgent care visits or any systemic steroid needs.     Review of systems: Review of Systems  Constitutional: Negative.   HENT:         See HPI  Eyes:        See HPI  Respiratory: Negative.    Cardiovascular: Negative.   Gastrointestinal: Negative.   Musculoskeletal: Negative.   Skin: Negative.   Neurological: Negative.    All other systems negative unless noted above in HPI  Past medical/social/surgical/family history have been reviewed and are unchanged unless specifically indicated below.  No changes  Medication  List: Current Outpatient Medications  Medication Sig Dispense Refill   albuterol (VENTOLIN HFA) 108 (90 Base) MCG/ACT inhaler Can inhale two puffs every four to six hours as needed for cough, wheeze, shortness of breath, or chest tightness. 8.5 g 1   Bepotastine Besilate (BEPREVE) 1.5 % SOLN Place 1 drop into both eyes daily as needed. 5 mL 5   budesonide-formoterol (SYMBICORT) 160-4.5 MCG/ACT inhaler Inhale 2 puffs into the lungs 2 (two) times daily. Rinse, gargle and spit after use. 1 each 5   furosemide (LASIX) 40 MG tablet Take by mouth. Three times a week     ipratropium (ATROVENT) 0.06 % nasal spray INSTILL 2 SPRAY INTO BOTH NOSTRILS TWICEDAILY 15 mL 5   levalbuterol (XOPENEX HFA) 45 MCG/ACT inhaler Can inhale two puffs every four to six hours as needed for cough or wheeze. 15 g 1   sevelamer carbonate (RENVELA) 800 MG tablet Take 1 tablet (800 mg total) by mouth 3 (three) times daily with meals. 90 tablet 1   sodium bicarbonate 650 MG tablet Take 1 tablet (650 mg total) by mouth 2 (two) times daily. 60 tablet 1   Vitamin D, Ergocalciferol, (DRISDOL) 1.25 MG (50000 UNIT) CAPS capsule Take by mouth.     No current facility-administered medications for this visit.     Known medication allergies: Allergies  Allergen Reactions   Grass Pollen(K-O-R-T-Swt Vern) Other (See Comments)    Eyes run, shortness of breath, itching      Physical examination: Blood pressure 130/80, pulse 68, resp. rate 16, SpO2 98 %.  General: Alert, interactive, in no acute distress. HEENT: PERRLA, TMs pearly gray, turbinates non-edematous without discharge, post-pharynx non erythematous. Neck: Supple without lymphadenopathy. Lungs: Clear to auscultation without wheezing, rhonchi or rales. {no increased work of breathing. CV: Normal S1, S2 without murmurs. Abdomen: Nondistended, nontender. Skin: Warm and dry, without lesions or rashes. Extremities:  No clubbing, cyanosis or edema. Neuro:   Grossly  intact.  Diagnositics/Labs: None today  Assessment and plan: Allergic rhinitis with conjunctivitis Vasomotor rhinitis Significant nasal drainage component  - continue avoidance measures for grass pollen, tree pollen, weed pollen   - continue nasal Atrovent 2 sprays each nostril up to 3-4 times a day for nasal drainage  - Add in Astelin 2 sprays 1-2 times a day.  This is your old nasal spray to see if combination of an antihistamine spray (Astelin) and anticholinergic spray (Atrovent) will give you more relief of nasal drainage  - for watery or itchy eyes can use OTC options: Pataday, Aloway or Zaditor  - can use lubricating eye drop like artificial tears to help with moisturizing eye for dry eye (this is what you had recommended by your eye doctor)  - check with your kidney doctor regarding steroid injection; this can be a short-term medication provided for control of symptoms but this is not a good long-term solution  - consider course of allergy shots for pollen allergy.  This is a 3-5 year therapy to retrain body to be tolerant to pollen and less likely to develop symptoms and need medication management  Reactive airway  - have access to Xopenex inhaler 2 puffs every 4-6 hours as needed for cough/wheeze/shortness of breath/chest tightness.  May use 15-20 minutes prior to activity.   Monitor frequency of use.    - continue Symbicort 117mcg 2 puffs twice day.  This is a controller inhaler to help reduce your daily symptoms and reduce Xopenex use  Control goals:   Full participation in all desired activities (may need albuterol before activity) Albuterol use two time or less a week on average (not counting use with activity) Cough interfering with sleep two time or less a month Oral steroids no more than once a year No hospitalizations  Follow-up in 4-2months or sooner if needed  I appreciate the opportunity to take part in Benjamin [name]'s care. Please do not hesitate to contact me with  questions.  Sincerely,   Prudy Feeler, MD Allergy/Immunology [facility]

## 2021-02-24 NOTE — Patient Instructions (Addendum)
Allergies  - continue avoidance measures for grass pollen, tree pollen, weed pollen   - continue nasal Atrovent 2 sprays each nostril up to 3-4 times a day for nasal drainage  - Add in Astelin 2 sprays 1-2 times a day.  This is your old nasal spray to see if combination of an antihistamine spray (Astelin) and anticholinergic spray (Atrovent) will give you more relief of nasal drainage  - for watery or itchy eyes can use OTC options: Pataday, Aloway or Zaditor  - can use lubricating eye drop like artificial tears to help with moisturizing eye for dry eye (this is what you had recommended by your eye doctor)  - check with your kidney doctor regarding steroid injection; this can be a short-term medication provided for control of symptoms but this is not a good long-term solution  - consider course of allergy shots for pollen allergy.  This is a 3-5 year therapy to retrain body to be tolerant to pollen and less likely to develop symptoms and need medication management  Reactive airway  - have access to Xopenex inhaler 2 puffs every 4-6 hours as needed for cough/wheeze/shortness of breath/chest tightness.  May use 15-20 minutes prior to activity.   Monitor frequency of use.    - continue Symbicort 19mcg 2 puffs twice day.  This is a controller inhaler to help reduce your daily symptoms and reduce Xopenex use  Control goals:   Full participation in all desired activities (may need albuterol before activity) Albuterol use two time or less a week on average (not counting use with activity) Cough interfering with sleep two time or less a month Oral steroids no more than once a year No hospitalizations  Follow-up in 4-75months or sooner if needed

## 2021-02-25 DIAGNOSIS — I120 Hypertensive chronic kidney disease with stage 5 chronic kidney disease or end stage renal disease: Secondary | ICD-10-CM | POA: Diagnosis not present

## 2021-02-25 DIAGNOSIS — D631 Anemia in chronic kidney disease: Secondary | ICD-10-CM | POA: Diagnosis not present

## 2021-02-25 DIAGNOSIS — R809 Proteinuria, unspecified: Secondary | ICD-10-CM | POA: Diagnosis not present

## 2021-02-25 DIAGNOSIS — N185 Chronic kidney disease, stage 5: Secondary | ICD-10-CM | POA: Diagnosis not present

## 2021-03-18 ENCOUNTER — Encounter (HOSPITAL_COMMUNITY): Payer: Self-pay

## 2021-03-18 ENCOUNTER — Other Ambulatory Visit: Payer: Self-pay

## 2021-03-18 ENCOUNTER — Emergency Department (HOSPITAL_COMMUNITY)
Admission: EM | Admit: 2021-03-18 | Discharge: 2021-03-18 | Disposition: A | Discharge status: Home / Self Care | Payer: BC Managed Care – PPO | Attending: Emergency Medicine | Admitting: Emergency Medicine

## 2021-03-18 DIAGNOSIS — Z79899 Other long term (current) drug therapy: Secondary | ICD-10-CM | POA: Diagnosis not present

## 2021-03-18 DIAGNOSIS — R799 Abnormal finding of blood chemistry, unspecified: Secondary | ICD-10-CM | POA: Diagnosis not present

## 2021-03-18 DIAGNOSIS — I120 Hypertensive chronic kidney disease with stage 5 chronic kidney disease or end stage renal disease: Secondary | ICD-10-CM | POA: Insufficient documentation

## 2021-03-18 DIAGNOSIS — E1122 Type 2 diabetes mellitus with diabetic chronic kidney disease: Secondary | ICD-10-CM | POA: Diagnosis not present

## 2021-03-18 DIAGNOSIS — R6889 Other general symptoms and signs: Secondary | ICD-10-CM | POA: Diagnosis not present

## 2021-03-18 DIAGNOSIS — Z7951 Long term (current) use of inhaled steroids: Secondary | ICD-10-CM | POA: Insufficient documentation

## 2021-03-18 DIAGNOSIS — D631 Anemia in chronic kidney disease: Secondary | ICD-10-CM | POA: Diagnosis not present

## 2021-03-18 DIAGNOSIS — R809 Proteinuria, unspecified: Secondary | ICD-10-CM | POA: Diagnosis not present

## 2021-03-18 DIAGNOSIS — F17220 Nicotine dependence, chewing tobacco, uncomplicated: Secondary | ICD-10-CM | POA: Diagnosis not present

## 2021-03-18 DIAGNOSIS — E871 Hypo-osmolality and hyponatremia: Secondary | ICD-10-CM

## 2021-03-18 DIAGNOSIS — N185 Chronic kidney disease, stage 5: Secondary | ICD-10-CM | POA: Diagnosis not present

## 2021-03-18 LAB — BASIC METABOLIC PANEL
Anion gap: 12 (ref 5–15)
Anion gap: 12 (ref 5–15)
BUN: 52 mg/dL — ABNORMAL HIGH (ref 8–23)
BUN: 49 mg/dL — ABNORMAL HIGH (ref 8–23)
CO2: 22 mmol/L (ref 22–32)
CO2: 21 mmol/L — ABNORMAL LOW (ref 22–32)
Calcium: 8.9 mg/dL (ref 8.9–10.3)
Calcium: 8.3 mg/dL — ABNORMAL LOW (ref 8.9–10.3)
Chloride: 87 mmol/L — ABNORMAL LOW (ref 98–111)
Chloride: 90 mmol/L — ABNORMAL LOW (ref 98–111)
Creatinine, Ser: 4.23 mg/dL — ABNORMAL HIGH (ref 0.61–1.24)
Creatinine, Ser: 4.05 mg/dL — ABNORMAL HIGH (ref 0.61–1.24)
GFR, Estimated: 15 mL/min — ABNORMAL LOW (ref >60)
GFR, Estimated: 16 mL/min — ABNORMAL LOW (ref >60)
Glucose, Bld: 97 mg/dL (ref 70–99)
Glucose, Bld: 88 mg/dL (ref 70–99)
Potassium: 4.1 mmol/L (ref 3.5–5.1)
Potassium: 4.2 mmol/L (ref 3.5–5.1)
Sodium: 121 mmol/L — ABNORMAL LOW (ref 135–145)
Sodium: 123 mmol/L — ABNORMAL LOW (ref 135–145)

## 2021-03-18 LAB — CBC WITH DIFFERENTIAL/PLATELET
Abs Immature Granulocytes: 0.01 10*3/uL (ref 0.00–0.07)
Basophils Absolute: 0 10*3/uL (ref 0.0–0.1)
Basophils Relative: 1 %
Eosinophils Absolute: 0.2 10*3/uL (ref 0.0–0.5)
Eosinophils Relative: 4 %
HCT: 42.2 % (ref 39.0–52.0)
Hemoglobin: 14 g/dL (ref 13.0–17.0)
Immature Granulocytes: 0 %
Lymphocytes Relative: 19 %
Lymphs Abs: 0.8 10*3/uL (ref 0.7–4.0)
MCH: 29.4 pg (ref 26.0–34.0)
MCHC: 33.2 g/dL (ref 30.0–36.0)
MCV: 88.5 fL (ref 80.0–100.0)
Monocytes Absolute: 0.3 10*3/uL (ref 0.1–1.0)
Monocytes Relative: 8 %
Neutro Abs: 2.9 10*3/uL (ref 1.7–7.7)
Neutrophils Relative %: 68 %
Platelets: 193 10*3/uL (ref 150–400)
RBC: 4.77 MIL/uL (ref 4.22–5.81)
RDW: 15 % (ref 11.5–15.5)
WBC: 4.3 10*3/uL (ref 4.0–10.5)
nRBC: 0 % (ref 0.0–0.2)

## 2021-03-18 MED ORDER — SODIUM CHLORIDE 0.9 % IV BOLUS
1000.0000 mL | Freq: Once | INTRAVENOUS | Status: AC
  Administered 2021-03-18: 1000 mL via INTRAVENOUS

## 2021-03-18 NOTE — ED Triage Notes (Signed)
Went to nephrologist and was told to come to ER for hyponatremia. NA-120.

## 2021-03-18 NOTE — Progress Notes (Signed)
Attempted to do nursing admission history on patient. Pt states he will not be staying and has told everyone that he is going home. Lucius Conn BSN, RN-BC Admissions RN 03/18/2021 8:31 PM

## 2021-03-18 NOTE — ED Provider Notes (Signed)
Emergency Medicine Provider Triage Evaluation Note  Benjamin [name] , a 65 y.o. male  was evaluated in triage.  Pt complains of abnormal lab work. He went to see his nephrologist today ([name]) for routine bloodwork. He was called stating that his sodium was low at 120 and he needed to come to the ED for further eval. Pt reports that he has bloodwork done monthly to check his kidney function. He hasn't had any new complaints recently. He does not drink alcohol.   Review of Systems  Positive: + low sodium Negative: - fevers, chills, weakness, confusion, chest pain  Physical Exam  BP (!) 148/97   Pulse 68   Temp 98 F (36.7 C)   Resp 20   Ht 5\' 9"  (1.753 m)   Wt 94 kg   SpO2 100%   BMI 30.60 kg/m  Gen:   Awake, no distress   Resp:  Normal effort  MSK:   Moves extremities without difficulty  Other:    Medical Decision Making  Medically screening exam initiated at 6:43 PM.  Appropriate orders placed.  [name] was informed that the remainder of the evaluation will be completed by another provider, this initial triage assessment does not replace that evaluation, and the importance of remaining in the ED until their evaluation is complete.     [name], PA-C 03/18/21 1845    [name], MD 03/18/21 [phone]

## 2021-03-18 NOTE — ED Notes (Signed)
Pt ambulatory in hallway 

## 2021-03-18 NOTE — ED Provider Notes (Signed)
A known [facility] DEPT Provider Note   CSN: [id number] Arrival date & time: 03/18/21  1821     History Chief Complaint  Patient presents with  . abnormal lab    Benjamin [name] is a 65 y.o. male.  Benjamin [name] was sent to the ED by his nephrologist.  He sees [facility], Dr. Jeanene Erb.  He had routine blood work revealing a sodium of 120.  He states that he feels at his physical baseline.  He does consume a significant quantity of water daily.  He estimates between 60 and 90 ounces per day.  He states that he frequently has a dry mouth and throat due to allergy treatments.  He denies working in the heat, excess sweating.  He is making urine, and states his urine is a very clear, light color.  He does take Lasix.  No dietary changes or other medication changes.  He denies any nausea, muscle cramping, neurologic symptoms.  He endorses some sensitivity to cold and some chronic dyspnea on exertion that has not worsened in 1 year.  The history is provided by the patient.      Past Medical History:  Diagnosis Date  . AKI (acute kidney injury) ([place]) 02/2020  . Diabetes mellitus without complication ([place])   . Hypertension     Patient Active Problem List   Diagnosis Date Noted  . Acute renal failure (ARF) ([place]) 03/04/2020  . Hyperkalemia 03/04/2020  . Metabolic acidosis 46/56/8127  . Hypertensive urgency 03/04/2020  . Anemia 03/04/2020    Past Surgical History:  Procedure Laterality Date  . WISDOM TOOTH EXTRACTION         Family History  Problem Relation Age of Onset  . Stroke Mother   . Stroke Father     Social History   Tobacco Use  . Smoking status: Never  . Smokeless tobacco: Current    Types: Chew  Vaping Use  . Vaping Use: Never used  Substance Use Topics  . Alcohol use: Not Currently  . Drug use: Never    Home Medications Prior to Admission medications   Medication Sig Start Date End Date Taking? Authorizing Provider   albuterol (VENTOLIN HFA) 108 (90 Base) MCG/ACT inhaler Can inhale two puffs every four to six hours as needed for cough, wheeze, shortness of breath, or chest tightness. 03/26/20   Kozlow, Donnamarie Poag, MD  Bepotastine Besilate (BEPREVE) 1.5 % SOLN Place 1 drop into both eyes daily as needed. 09/23/20   Kennith Gain, MD  budesonide-formoterol [facility]) 160-4.5 MCG/ACT inhaler Inhale 2 puffs into the lungs 2 (two) times daily. Rinse, gargle and spit after use. 06/24/20   Kennith Gain, MD  furosemide (LASIX) 40 MG tablet Take by mouth. Three times a week 03/20/20   [provider]  ipratropium (ATROVENT) 0.06 % nasal spray INSTILL 2 SPRAY INTO BOTH NOSTRILS TWICEDAILY 11/04/20   Kennith Gain, MD  levalbuterol [facility] HFA) 45 MCG/ACT inhaler Can inhale two puffs every four to six hours as needed for cough or wheeze. 03/18/20   Kennith Gain, MD  sevelamer carbonate (RENVELA) 800 MG tablet Take 1 tablet (800 mg total) by mouth 3 (three) times daily with meals. 03/12/20   Shelly Coss, MD  sodium bicarbonate 650 MG tablet Take 1 tablet (650 mg total) by mouth 2 (two) times daily. 03/12/20   Shelly Coss, MD  Vitamin D, Ergocalciferol, (DRISDOL) 1.25 MG (50000 UNIT) CAPS capsule Take by mouth. 09/09/20  [provider]    Allergies    Grass pollen(k-o-r-t-swt vern)  Review of Systems   Review of Systems  Constitutional:  Negative for chills and fever.  HENT:  Negative for ear pain and sore throat.   Eyes:  Negative for pain and visual disturbance.  Respiratory:  Negative for cough and shortness of breath.   Cardiovascular:  Negative for chest pain and palpitations.  Gastrointestinal:  Negative for abdominal pain and vomiting.  Genitourinary:  Negative for dysuria and hematuria.  Musculoskeletal:  Negative for arthralgias and back pain.  Skin:  Negative for color change and rash.  Neurological:  Negative for seizures and syncope.  All  other systems reviewed and are negative.  Physical Exam Updated Vital Signs BP (!) 148/97   Pulse 68   Temp 98 F (36.7 C)   Resp 20   Ht 5\' 9"  (1.753 m)   Wt 94 kg   SpO2 100%   BMI 30.60 kg/m   Physical Exam Vitals and nursing note reviewed.  Constitutional:      Appearance: Normal appearance.  HENT:     Head: Normocephalic and atraumatic.  Eyes:     Conjunctiva/sclera: Conjunctivae normal.  Pulmonary:     Effort: Pulmonary effort is normal. No respiratory distress.  Musculoskeletal:        General: No deformity. Normal range of motion.     Cervical back: Normal range of motion.  Skin:    General: Skin is warm and dry.  Neurological:     General: No focal deficit present.     Mental Status: He is alert and oriented to person, place, and time. Mental status is at baseline.  Psychiatric:        Mood and Affect: Mood normal.    ED Results / Procedures / Treatments   Labs (all labs ordered are listed, but only abnormal results are displayed) Labs Reviewed  BASIC METABOLIC PANEL - Abnormal; Notable for the following components:      Result Value   Sodium 121 (*)    Chloride 87 (*)    BUN 52 (*)    Creatinine, Ser 4.23 (*)    GFR, Estimated 15 (*)    All other components within normal limits  BASIC METABOLIC PANEL - Abnormal; Notable for the following components:   Sodium 123 (*)    Chloride 90 (*)    CO2 21 (*)    BUN 49 (*)    Creatinine, Ser 4.05 (*)    Calcium 8.3 (*)    GFR, Estimated 16 (*)    All other components within normal limits  CBC WITH DIFFERENTIAL/PLATELET    EKG None  Radiology No results found.  Procedures Procedures   Medications Ordered in ED Medications - No data to display  ED Course  I have reviewed the triage vital signs and the nursing notes.  Pertinent labs & imaging results that were available during my care of the patient were reviewed by me and considered in my medical decision making (see chart for  details).  Clinical Course as of 03/18/21 2254  Wed Mar 18, 2021  2251 I spoke with Dr. Carolin Sicks who recommended that the patient restrict his fluid to between 1215 100 cc per 24-hour period.  He should hold his Lasix until seen by nephrology.  He should follow-up with nephrology within 1 week.  He should increase his protein. [AW]    Clinical Course User Index [AW] [name], MD   MDM Rules/Calculators/A&P  Caylen G Dress presented upon referral from nephrology for hyponatremia.  History of chronic kidney disease, and he saw nephrology for a routine blood check.  Here, hyponatremia was confirmed.  After consultation with nephrology, the patient was given 1 L of IV fluid with increase in his sodium to 123.  He was instructed to hold his Lasix, reduce his fluid consumption, and increase his protein.  He was advised to follow closely with nephrology and his primary care doctor.  He was given return precautions.  Because he is asymptomatic, hospitalization was not indicated.  This is also predicated on the fact that he will seek close follow-up.  He is agreeable to the plan and understands all instructions. Final Clinical Impression(s) / ED Diagnoses Final diagnoses:  Hyponatremia  Stage 5 chronic kidney disease not on chronic dialysis [facility])    Rx / DC Orders ED Discharge Orders     None        [name], MD 03/18/21 2254

## 2021-03-18 NOTE — Discharge Instructions (Addendum)
Please temporarily stop taking your Lasix until you speak with nephrology.  You should also restrict your fluid to between 1200 cc(40 oz) -1500 (50 oz)cc per 24 hours.  This includes fluid from food as well as liquid.  Increasing your protein can also help.

## 2021-03-20 DIAGNOSIS — E871 Hypo-osmolality and hyponatremia: Secondary | ICD-10-CM | POA: Diagnosis not present

## 2021-03-25 DIAGNOSIS — D631 Anemia in chronic kidney disease: Secondary | ICD-10-CM | POA: Diagnosis not present

## 2021-03-25 DIAGNOSIS — R809 Proteinuria, unspecified: Secondary | ICD-10-CM | POA: Diagnosis not present

## 2021-03-25 DIAGNOSIS — N185 Chronic kidney disease, stage 5: Secondary | ICD-10-CM | POA: Diagnosis not present

## 2021-03-25 DIAGNOSIS — I120 Hypertensive chronic kidney disease with stage 5 chronic kidney disease or end stage renal disease: Secondary | ICD-10-CM | POA: Diagnosis not present

## 2021-03-26 ENCOUNTER — Encounter (HOSPITAL_COMMUNITY): Payer: Self-pay | Admitting: *Deleted

## 2021-03-26 ENCOUNTER — Other Ambulatory Visit: Payer: Self-pay

## 2021-03-26 ENCOUNTER — Inpatient Hospital Stay (HOSPITAL_COMMUNITY)
Admission: EM | Admit: 2021-03-26 | Discharge: 2021-04-01 | DRG: 641 | Disposition: A | Discharge status: Home Health | Payer: BC Managed Care – PPO | Attending: Internal Medicine | Admitting: Internal Medicine

## 2021-03-26 DIAGNOSIS — F17220 Nicotine dependence, chewing tobacco, uncomplicated: Secondary | ICD-10-CM | POA: Diagnosis present

## 2021-03-26 DIAGNOSIS — E1110 Type 2 diabetes mellitus with ketoacidosis without coma: Secondary | ICD-10-CM | POA: Diagnosis not present

## 2021-03-26 DIAGNOSIS — N185 Chronic kidney disease, stage 5: Secondary | ICD-10-CM | POA: Diagnosis not present

## 2021-03-26 DIAGNOSIS — Z823 Family history of stroke: Secondary | ICD-10-CM

## 2021-03-26 DIAGNOSIS — Z79899 Other long term (current) drug therapy: Secondary | ICD-10-CM

## 2021-03-26 DIAGNOSIS — E8770 Fluid overload, unspecified: Secondary | ICD-10-CM | POA: Diagnosis not present

## 2021-03-26 DIAGNOSIS — E11649 Type 2 diabetes mellitus with hypoglycemia without coma: Secondary | ICD-10-CM | POA: Diagnosis not present

## 2021-03-26 DIAGNOSIS — I120 Hypertensive chronic kidney disease with stage 5 chronic kidney disease or end stage renal disease: Secondary | ICD-10-CM | POA: Diagnosis not present

## 2021-03-26 DIAGNOSIS — E1129 Type 2 diabetes mellitus with other diabetic kidney complication: Secondary | ICD-10-CM | POA: Diagnosis not present

## 2021-03-26 DIAGNOSIS — I10 Essential (primary) hypertension: Secondary | ICD-10-CM | POA: Diagnosis not present

## 2021-03-26 DIAGNOSIS — W1830XA Fall on same level, unspecified, initial encounter: Secondary | ICD-10-CM | POA: Diagnosis not present

## 2021-03-26 DIAGNOSIS — D631 Anemia in chronic kidney disease: Secondary | ICD-10-CM | POA: Diagnosis not present

## 2021-03-26 DIAGNOSIS — Z20822 Contact with and (suspected) exposure to covid-19: Secondary | ICD-10-CM | POA: Diagnosis present

## 2021-03-26 DIAGNOSIS — D696 Thrombocytopenia, unspecified: Secondary | ICD-10-CM | POA: Diagnosis not present

## 2021-03-26 DIAGNOSIS — Y92230 Patient room in hospital as the place of occurrence of the external cause: Secondary | ICD-10-CM | POA: Diagnosis present

## 2021-03-26 DIAGNOSIS — E119 Type 2 diabetes mellitus without complications: Secondary | ICD-10-CM | POA: Diagnosis not present

## 2021-03-26 DIAGNOSIS — E872 Acidosis: Secondary | ICD-10-CM | POA: Diagnosis present

## 2021-03-26 DIAGNOSIS — S301XXA Contusion of abdominal wall, initial encounter: Secondary | ICD-10-CM | POA: Diagnosis not present

## 2021-03-26 DIAGNOSIS — I129 Hypertensive chronic kidney disease with stage 1 through stage 4 chronic kidney disease, or unspecified chronic kidney disease: Secondary | ICD-10-CM | POA: Diagnosis not present

## 2021-03-26 DIAGNOSIS — E1122 Type 2 diabetes mellitus with diabetic chronic kidney disease: Secondary | ICD-10-CM | POA: Diagnosis present

## 2021-03-26 DIAGNOSIS — E861 Hypovolemia: Secondary | ICD-10-CM | POA: Diagnosis not present

## 2021-03-26 DIAGNOSIS — Z7951 Long term (current) use of inhaled steroids: Secondary | ICD-10-CM

## 2021-03-26 DIAGNOSIS — E871 Hypo-osmolality and hyponatremia: Secondary | ICD-10-CM | POA: Diagnosis not present

## 2021-03-26 LAB — CBC WITH DIFFERENTIAL/PLATELET
Abs Immature Granulocytes: 0.01 10*3/uL (ref 0.00–0.07)
Basophils Absolute: 0 10*3/uL (ref 0.0–0.1)
Basophils Relative: 1 %
Eosinophils Absolute: 0.1 10*3/uL (ref 0.0–0.5)
Eosinophils Relative: 3 %
HCT: 43.2 % (ref 39.0–52.0)
Hemoglobin: 14.4 g/dL (ref 13.0–17.0)
Immature Granulocytes: 0 %
Lymphocytes Relative: 18 %
Lymphs Abs: 0.7 10*3/uL (ref 0.7–4.0)
MCH: 29.6 pg (ref 26.0–34.0)
MCHC: 33.3 g/dL (ref 30.0–36.0)
MCV: 88.7 fL (ref 80.0–100.0)
Monocytes Absolute: 0.3 10*3/uL (ref 0.1–1.0)
Monocytes Relative: 8 %
Neutro Abs: 2.8 10*3/uL (ref 1.7–7.7)
Neutrophils Relative %: 70 %
Platelets: 174 10*3/uL (ref 150–400)
RBC: 4.87 MIL/uL (ref 4.22–5.81)
RDW: 15.7 % — ABNORMAL HIGH (ref 11.5–15.5)
WBC: 4 10*3/uL (ref 4.0–10.5)
nRBC: 0 % (ref 0.0–0.2)

## 2021-03-26 LAB — COMPREHENSIVE METABOLIC PANEL
ALT: 15 U/L (ref 0–44)
AST: 37 U/L (ref 15–41)
Albumin: 3.2 g/dL — ABNORMAL LOW (ref 3.5–5.0)
Alkaline Phosphatase: 117 U/L (ref 38–126)
Anion gap: 13 (ref 5–15)
BUN: 53 mg/dL — ABNORMAL HIGH (ref 8–23)
CO2: 18 mmol/L — ABNORMAL LOW (ref 22–32)
Calcium: 8.8 mg/dL — ABNORMAL LOW (ref 8.9–10.3)
Chloride: 86 mmol/L — ABNORMAL LOW (ref 98–111)
Creatinine, Ser: 4.2 mg/dL — ABNORMAL HIGH (ref 0.61–1.24)
GFR, Estimated: 15 mL/min — ABNORMAL LOW (ref >60)
Glucose, Bld: 52 mg/dL — ABNORMAL LOW (ref 70–99)
Potassium: 4.5 mmol/L (ref 3.5–5.1)
Sodium: 117 mmol/L — CL (ref 135–145)
Total Bilirubin: 3.2 mg/dL — ABNORMAL HIGH (ref 0.3–1.2)
Total Protein: 6.7 g/dL (ref 6.5–8.1)

## 2021-03-26 LAB — BASIC METABOLIC PANEL
Anion gap: 14 (ref 5–15)
BUN: 53 mg/dL — ABNORMAL HIGH (ref 8–23)
CO2: 16 mmol/L — ABNORMAL LOW (ref 22–32)
Calcium: 9 mg/dL (ref 8.9–10.3)
Chloride: 89 mmol/L — ABNORMAL LOW (ref 98–111)
Creatinine, Ser: 4.15 mg/dL — ABNORMAL HIGH (ref 0.61–1.24)
GFR, Estimated: 15 mL/min — ABNORMAL LOW (ref >60)
Glucose, Bld: 47 mg/dL — ABNORMAL LOW (ref 70–99)
Potassium: 4.6 mmol/L (ref 3.5–5.1)
Sodium: 119 mmol/L — CL (ref 135–145)

## 2021-03-26 LAB — URINALYSIS, ROUTINE W REFLEX MICROSCOPIC
Bilirubin Urine: NEGATIVE
Glucose, UA: 50 mg/dL — AB
Ketones, ur: 5 mg/dL — AB
Leukocytes,Ua: NEGATIVE
Nitrite: NEGATIVE
Protein, ur: >=300 mg/dL — AB
Specific Gravity, Urine: 1.009 (ref 1.005–1.030)
pH: 5 (ref 5.0–8.0)

## 2021-03-26 LAB — CREATININE, URINE, RANDOM: Creatinine, Urine: 93.71 mg/dL

## 2021-03-26 LAB — TSH: TSH: 3.258 u[IU]/mL (ref 0.350–4.500)

## 2021-03-26 LAB — SODIUM, URINE, RANDOM: Sodium, Ur: 13 mmol/L

## 2021-03-26 MED ORDER — HEPARIN SODIUM (PORCINE) 5000 UNIT/ML IJ SOLN
5000.0000 [IU] | Freq: Three times a day (TID) | INTRAMUSCULAR | Status: DC
  Administered 2021-03-27 – 2021-03-31 (×5): 5000 [IU] via SUBCUTANEOUS
  Filled 2021-03-26 (×10): qty 1

## 2021-03-26 MED ORDER — ACETAMINOPHEN 650 MG RE SUPP: 650.0000 mg | Freq: Four times a day (QID) | RECTAL | Status: DC | PRN

## 2021-03-26 MED ORDER — MOMETASONE FURO-FORMOTEROL FUM 200-5 MCG/ACT IN AERO
2.0000 | INHALATION_SPRAY | Freq: Two times a day (BID) | RESPIRATORY_TRACT | Status: DC
  Administered 2021-03-27 – 2021-04-01 (×11): 2 via RESPIRATORY_TRACT
  Filled 2021-03-26: qty 8.8

## 2021-03-26 MED ORDER — ACETAMINOPHEN 325 MG PO TABS
650.0000 mg | ORAL_TABLET | Freq: Four times a day (QID) | ORAL | Status: DC | PRN
  Administered 2021-03-27 – 2021-03-31 (×4): 650 mg via ORAL
  Filled 2021-03-26 (×4): qty 2

## 2021-03-26 MED ORDER — IPRATROPIUM BROMIDE 0.06 % NA SOLN: 2.0000 | Freq: Every day | NASAL | Status: DC | PRN

## 2021-03-26 MED ORDER — ONDANSETRON HCL 4 MG PO TABS: 4.0000 mg | ORAL_TABLET | Freq: Four times a day (QID) | ORAL | Status: DC | PRN

## 2021-03-26 MED ORDER — SODIUM CHLORIDE 0.9 % IV BOLUS
500.0000 mL | Freq: Once | INTRAVENOUS | Status: AC
  Administered 2021-03-26: 500 mL via INTRAVENOUS

## 2021-03-26 MED ORDER — SODIUM BICARBONATE 650 MG PO TABS
1300.0000 mg | ORAL_TABLET | Freq: Three times a day (TID) | ORAL | Status: DC
  Administered 2021-03-26 – 2021-04-01 (×17): 1300 mg via ORAL
  Filled 2021-03-26 (×17): qty 2

## 2021-03-26 MED ORDER — ONDANSETRON HCL 4 MG/2ML IJ SOLN: 4.0000 mg | Freq: Four times a day (QID) | INTRAMUSCULAR | Status: DC | PRN

## 2021-03-26 MED ORDER — SODIUM BICARBONATE 650 MG PO TABS
650.0000 mg | ORAL_TABLET | Freq: Two times a day (BID) | ORAL | Status: DC
  Filled 2021-03-26: qty 1

## 2021-03-26 MED ORDER — UREA 15 G PO PACK
15.0000 g | PACK | Freq: Two times a day (BID) | ORAL | Status: DC
  Administered 2021-03-26 – 2021-03-27 (×2): 15 g via ORAL
  Filled 2021-03-26 (×2): qty 1

## 2021-03-26 MED ORDER — POLYVINYL ALCOHOL 1.4 % OP SOLN: 1.0000 [drp] | Freq: Every day | OPHTHALMIC | Status: DC | PRN

## 2021-03-26 MED ORDER — ALBUTEROL SULFATE (2.5 MG/3ML) 0.083% IN NEBU: 3.0000 mL | INHALATION_SOLUTION | Freq: Four times a day (QID) | RESPIRATORY_TRACT | Status: DC | PRN

## 2021-03-26 NOTE — ED Notes (Signed)
Pt states he does not need to urinate at this time for urine sample.

## 2021-03-26 NOTE — Consult Note (Signed)
Nephrology Consult   Requesting provider: Etta Quill, DO  Assessment/Recommendations:   Severe Hyponatremia, asymptomatic -check serum osm, urine osm, urine na, tsh, cortisol -recheck labs after NS infusion -neurochecks -consider monitoring pt in stepdown or icu in the interim -q4-6h na checks -fluid restrict 1.2L/day -starting urena 15g bid  CKD5  -secondary chronic scarring, HTN, DM -prepping to start renal replacement therapy as an outpatient. Follows with Dr. Royce Macadamia. Can continue to follow as an outpatient -b/l Cr around 4, currently stable -there is a concern that his cold sensitivity and fatigue may be related to early uremic symptoms -Continue to monitor daily Cr, Dose meds for GFR<15 -Monitor Daily I/Os, Daily weight  -Maintain MAP>65 for optimal renal perfusion.  -Agree with holding ACE-I, avoid further nephrotoxins including NSAIDS, Morphine.  Unless absolutely necessary, avoid CT with contrast and/or MRI with gadolinium.     Hypertension: -continue home meds  Metabolic acidosis -increase nacho3 to 1300mg  tid  Anemia due to chronic kidney disease: -Transfuse for Hgb<7 g/dL -hgb at goal  Diabetes Mellitus Type 2 -per primary service   [facility] 03/26/2021 8:07 PM   _____________________________________________________________________________________   History of Present Illness: Benjamin [name] is a/an 65 y.o. male with a past medical history of CKD5, HTN, DM, proteinuria who presents to [facility] ER with hyponatremia. Had been seen in the office yesterday for a follow up visit. Labs performed which revealed a Na of 118, Cr 4, chloride 80, bicarb 15. Has been resistant on starting renal replacement therapy but is now amenable to starting PD which he is undergoing an eval for. Has been experiencing fatigue and cold sensitivity. Was recently in the ER for same issue, was given 1L of NS, lasix d/c'ed, was instructed to fluid restrict  and increase protein intake which resulted an improvement in Na. His biggest complain is chronic leg weakness (for almost a year) and chronic cold sensitivity. Otherwise denies any new symptoms: denies fevers, chest pain, shortness of breath, orthopnea, PND, n/v, dysgeusia, loss of appetite, brain fog, intractable hiccups/pruritis, dizziness, changes in vision, parasthesias.   Medications:  Current Facility-Administered Medications  Medication Dose Route Frequency Provider Last Rate Last Admin   albuterol (PROVENTIL) (2.5 MG/3ML) 0.083% nebulizer solution 3 mL  3 mL Inhalation Q6H PRN Etta Quill, DO       ipratropium (ATROVENT) 0.06 % nasal spray 2 spray  2 spray Each Nare Daily PRN Etta Quill, DO       mometasone-formoterol (DULERA) 200-5 MCG/ACT inhaler 2 puff  2 puff Inhalation BID Jennette Kettle M, DO       polyvinyl alcohol (LIQUIFILM TEARS) 1.4 % ophthalmic solution 1 drop  1 drop Both Eyes Daily PRN Jennette Kettle M, DO       sodium bicarbonate tablet 650 mg  650 mg Oral BID Etta Quill, DO       Current Outpatient Medications  Medication Sig Dispense Refill   albuterol (VENTOLIN HFA) 108 (90 Base) MCG/ACT inhaler Can inhale two puffs every four to six hours as needed for cough, wheeze, shortness of breath, or chest tightness. (Patient taking differently: 2 puffs every 6 (six) hours as needed.) 8.5 g 1   budesonide-formoterol (SYMBICORT) 160-4.5 MCG/ACT inhaler Inhale 2 puffs into the lungs 2 (two) times daily. Rinse, gargle and spit after use. (Patient taking differently: Inhale 2 puffs into the lungs daily. Rinse, gargle and spit after use.) 1 each 5   furosemide (LASIX) 40 MG tablet Take 80 mg  by mouth daily.     hydroxypropyl methylcellulose / hypromellose (ISOPTO TEARS / GONIOVISC) 2.5 % ophthalmic solution Place 1 drop into both eyes daily as needed for dry eyes.     ipratropium (ATROVENT) 0.06 % nasal spray INSTILL 2 SPRAY INTO BOTH NOSTRILS TWICEDAILY (Patient  taking differently: Place 2 sprays into both nostrils daily as needed for rhinitis.) 15 mL 5   sodium bicarbonate 650 MG tablet Take 1 tablet (650 mg total) by mouth 2 (two) times daily. (Patient taking differently: Take 650 mg by mouth 2 (two) times daily as needed for heartburn.) 60 tablet 1   Vitamin D, Ergocalciferol, (DRISDOL) 1.25 MG (50000 UNIT) CAPS capsule Take 50,000 Units by mouth every 7 (seven) days.     Bepotastine Besilate (BEPREVE) 1.5 % SOLN Place 1 drop into both eyes daily as needed. (Patient not taking: No sig reported) 5 mL 5   levalbuterol (XOPENEX HFA) 45 MCG/ACT inhaler Can inhale two puffs every four to six hours as needed for cough or wheeze. (Patient not taking: No sig reported) 15 g 1   sevelamer carbonate (RENVELA) 800 MG tablet Take 1 tablet (800 mg total) by mouth 3 (three) times daily with meals. (Patient not taking: No sig reported) 90 tablet 1     ALLERGIES Grass pollen(k-o-r-t-swt vern)  MEDICAL HISTORY Past Medical History:  Diagnosis Date   AKI (acute kidney injury) ([place]) 02/2020   Diabetes mellitus without complication ([facility])    Hypertension      SOCIAL HISTORY Social History   Socioeconomic History   Marital status: Married    Spouse name: Not on file   Number of children: Not on file   Years of education: Not on file   Highest education level: Not on file  Occupational History   Not on file  Tobacco Use   Smoking status: Never   Smokeless tobacco: Current    Types: Chew  Vaping Use   Vaping Use: Never used  Substance and Sexual Activity   Alcohol use: Not Currently   Drug use: Never   Sexual activity: Not on file  Other Topics Concern   Not on file  Social History Narrative   Not on file   Social Determinants of Health   Financial Resource Strain: Not on file  Food Insecurity: Not on file  Transportation Needs: Not on file  Physical Activity: Not on file  Stress: Not on file  Social Connections: Not on file  Intimate Partner  Violence: Not on file     FAMILY HISTORY Family History  Problem Relation Age of Onset   Stroke Mother    Stroke Father     Review of Systems: 12 systems reviewed Otherwise as per HPI, all other systems reviewed and negative  Physical Exam: Vitals:   03/26/21 1909 03/26/21 1948  BP: (!) 137/94 (!) 132/100  Pulse: 71 66  Resp: 18 18  Temp:    SpO2: 99% 100%   No intake/output data recorded. No intake or output data in the 24 hours ending 03/26/21 2007 General: well-appearing, no acute distress HEENT: anicteric sclera, oropharynx clear without lesions CV: regular rate, normal rhythm, +systolic murmur, no gallops, no rubs Lungs: clear to auscultation bilaterally, normal work of breathing, on RA, speaking in full sentences, no acc muscle use Abd: soft, non-tender, non-distended Skin: no visible lesions or rashes Psych: alert, engaged, appropriate mood and affect Musculoskeletal: trace edema bilateral LE's Neuro: normal speech, no gross focal deficits   Test Results Reviewed Lab Results  Component Value Date   NA 117 (LL) 03/26/2021   K 4.5 03/26/2021   CL 86 (L) 03/26/2021   CO2 18 (L) 03/26/2021   BUN 53 (H) 03/26/2021   CREATININE 4.20 (H) 03/26/2021   CALCIUM 8.8 (L) 03/26/2021   ALBUMIN 3.2 (L) 03/26/2021   PHOS 5.2 (H) 03/12/2020     I have reviewed all relevant outside healthcare records related to the patient's kidney injury.

## 2021-03-26 NOTE — ED Notes (Signed)
Triage nurse aware of critical sodium 117.

## 2021-03-26 NOTE — Progress Notes (Signed)
   03/26/21 2325  Provider Notification  Provider Name/Title J. Olena Heckle  Date Provider Notified 03/26/21  Time Provider Notified 2327  Notification Type Page  Notification Reason Critical result  Test performed and critical result Sodium 119  Date Critical Result Received 03/26/21  Time Critical Result Received 2325  Provider response No new orders  Date of Provider Response 03/26/21  Time of Provider Response [phone]

## 2021-03-26 NOTE — ED Provider Notes (Signed)
Emergency Medicine Provider Triage Evaluation Note  [name] , a 65 y.o. male  was evaluated in triage.  Pt complains of abnormal labs.  Followed by Dr. Royce Macadamia, nephrology for CKD.  Patient was told he had low sodium.  Was seen here last week for similar.  Patient is not on dialysis however states he is "close."  States does have decreased solid food p.o. intake due to altered sense of taste which has been ongoing and he relates to allergies. States drink large amount of water at home.  No fever, chills, nausea, vomiting, abdominal pain.  No change in urination.  Review of Systems  Positive: Hyponatremia Negative: Fever, chills, emesis, chest pain, abdominal pain, dysuria  Physical Exam  BP 140/87 (BP Location: Left Arm)   Pulse 66   Temp 97.9 F (36.6 C) (Oral)   Resp 18   SpO2 98%  Gen:   Awake, no distress   Resp:  Normal effort  MSK:   Moves extremities without difficulty  Other:    Medical Decision Making  Medically screening exam initiated at 1:52 PM.  Appropriate orders placed.  Khoi G Marone was informed that the remainder of the evaluation will be completed by another provider, this initial triage assessment does not replace that evaluation, and the importance of remaining in the ED until their evaluation is complete.  Abnormal labs  Vital signs stable, labs ordered   Nettie Elm, PA-C 03/26/21 1354    Valarie Merino, MD 03/27/21 [phone]

## 2021-03-26 NOTE — H&P (Signed)
History and Physical    Benjamin [name] EZM:[id number] DOB: 12-22-1955 DOA: 03/26/2021  PCP: Mateo Flow, MD  Patient coming from: Home  I have personally briefly reviewed patient's old medical records in [place]  Chief Complaint: Hyponatremia  HPI: Benjamin [name] is a 65 y.o. 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  Pt presents to ED for hyponatremia.  Pt was seen in ED on 7/13 for hyponatremia, given NS with improvement in sodium from 121 to 123.  It was felt that he was drinking too much free water (between 60-90 oz free water due to dry mouth / day).  Told to restrict free water intake and DC his lasix and pt discharged.  Pt had labs drawn again yesterday, told he had even worse hyponatremia and sent to ED today.  As with ED visit last week, pt states he feels at baseline, denies difficulty making urine, nausea, vomiting, CP, SOB, muscle cramping, neurologic complaints.   ED Course: EDP spoke with nephrology.  Urine sodium pending  Nephrology recd 500cc NS bolus and repeating BMP.  They will see in consult either tonight or in AM.   Review of Systems: As per HPI, otherwise all review of systems negative.  Past Medical History:  Diagnosis Date   AKI (acute kidney injury) ([place]) 02/2020   Diabetes mellitus without complication ([place])    Hypertension     Past Surgical History:  Procedure Laterality Date   WISDOM TOOTH EXTRACTION       reports that he has never smoked. His smokeless tobacco use includes chew. He reports previous alcohol use. He reports that he does not use drugs.  Allergies  Allergen Reactions   Grass Pollen(K-O-R-T-Swt Vern) Other (See Comments)    Eyes run, shortness of breath, itching     Family History  Problem Relation Age of Onset   Stroke Mother    Stroke Father      Prior to Admission medications   Medication Sig Start Date End Date Taking? Authorizing  Provider  albuterol (VENTOLIN HFA) 108 (90 Base) MCG/ACT inhaler Can inhale two puffs every four to six hours as needed for cough, wheeze, shortness of breath, or chest tightness. Patient taking differently: 2 puffs every 6 (six) hours as needed. 03/26/20  Yes Kozlow, Donnamarie Poag, MD  budesonide-formoterol [facility]) 160-4.5 MCG/ACT inhaler Inhale 2 puffs into the lungs 2 (two) times daily. Rinse, gargle and spit after use. Patient taking differently: Inhale 2 puffs into the lungs daily. Rinse, gargle and spit after use. 06/24/20  Yes Padgett, Rae Halsted, MD  furosemide (LASIX) 40 MG tablet Take 80 mg by mouth daily. 03/20/20  Yes [provider]  hydroxypropyl methylcellulose / hypromellose (ISOPTO TEARS / GONIOVISC) 2.5 % ophthalmic solution Place 1 drop into both eyes daily as needed for dry eyes.   Yes [provider]  ipratropium (ATROVENT) 0.06 % nasal spray INSTILL 2 SPRAY INTO BOTH NOSTRILS TWICEDAILY Patient taking differently: Place 2 sprays into both nostrils daily as needed for rhinitis. 11/04/20  Yes Padgett, Rae Halsted, MD  sodium bicarbonate 650 MG tablet Take 1 tablet (650 mg total) by mouth 2 (two) times daily. Patient taking differently: Take 650 mg by mouth 2 (two) times daily as needed for heartburn. 03/12/20  Yes Shelly Coss, MD  Vitamin D, Ergocalciferol, (DRISDOL) 1.25 MG (50000 UNIT) CAPS capsule Take 50,000 Units by mouth every 7 (seven) days. 09/09/20  Yes [provider]  Bepotastine Besilate (BEPREVE) 1.5 % SOLN Place 1 drop into both eyes daily as needed. Patient not taking: No sig reported 09/23/20   Kennith Gain, MD  levalbuterol [facility] HFA) 45 MCG/ACT inhaler Can inhale two puffs every four to six hours as needed for cough or wheeze. Patient not taking: No sig reported 03/18/20   Kennith Gain, MD  sevelamer carbonate (RENVELA) 800 MG tablet Take 1 tablet (800 mg total) by mouth 3 (three) times daily with  meals. Patient not taking: No sig reported 03/12/20   Shelly Coss, MD    Physical Exam: Vitals:   03/26/21 1349 03/26/21 1909 03/26/21 1948  BP: 140/87 (!) 137/94 (!) 132/100  Pulse: 66 71 66  Resp: 18 18 18   Temp: 97.9 F (36.6 C)    TempSrc: Oral    SpO2: 98% 99% 100%    Constitutional: NAD, calm, comfortable, states he feels very cold, but this is frequent and baseline Eyes: PERRL, lids and conjunctivae normal ENMT: Mucous membranes are moist. Posterior pharynx clear of any exudate or lesions.Normal dentition.  Neck: normal, supple, no masses, no thyromegaly Respiratory: clear to auscultation bilaterally, no wheezing, no crackles. Normal respiratory effort. No accessory muscle use.  Cardiovascular: Regular rate and rhythm, no murmurs / rubs / gallops. No extremity edema. 2+ pedal pulses. No carotid bruits.  Abdomen: no tenderness, no masses palpated. No hepatosplenomegaly. Bowel sounds positive.  Musculoskeletal: no clubbing / cyanosis. No joint deformity upper and lower extremities. Good ROM, no contractures. Normal muscle tone.  Skin: no rashes, lesions, ulcers. No induration Neurologic: CN 2-12 grossly intact. Sensation intact, DTR normal. Strength 5/5 in all 4.  Psychiatric: Normal judgment and insight. Alert and oriented x 3. Normal mood.    Labs on Admission: I have personally reviewed following labs and imaging studies  CBC: Recent Labs  Lab 03/26/21 1412  WBC 4.0  NEUTROABS 2.8  HGB 14.4  HCT 43.2  MCV 88.7  PLT 224   Basic Metabolic Panel: Recent Labs  Lab 03/26/21 1412  NA 117*  K 4.5  CL 86*  CO2 18*  GLUCOSE 52*  BUN 53*  CREATININE 4.20*  CALCIUM 8.8*   GFR: Estimated Creatinine Clearance: 20.4 mL/min (A) (by C-G formula based on SCr of 4.2 mg/dL (H)). Liver Function Tests: Recent Labs  Lab 03/26/21 1412  AST 37  ALT 15  ALKPHOS 117  BILITOT 3.2*  PROT 6.7  ALBUMIN 3.2*   No results for input(s): LIPASE, AMYLASE in the last 168  hours. No results for input(s): AMMONIA in the last 168 hours. Coagulation Profile: No results for input(s): INR, PROTIME in the last 168 hours. Cardiac Enzymes: No results for input(s): CKTOTAL, CKMB, CKMBINDEX, TROPONINI in the last 168 hours. BNP (last 3 results) No results for input(s): PROBNP in the last 8760 hours. HbA1C: No results for input(s): HGBA1C in the last 72 hours. CBG: No results for input(s): GLUCAP in the last 168 hours. Lipid Profile: No results for input(s): CHOL, HDL, LDLCALC, TRIG, CHOLHDL, LDLDIRECT in the last 72 hours. Thyroid Function Tests: No results for input(s): TSH, T4TOTAL, FREET4, T3FREE, THYROIDAB in the last 72 hours. Anemia Panel: No results for input(s): VITAMINB12, FOLATE, FERRITIN, TIBC, IRON, RETICCTPCT in the last 72 hours. Urine analysis:    Component Value Date/Time   COLORURINE YELLOW 03/03/2020 2028   APPEARANCEUR HAZY (A) 03/03/2020 2028   LABSPEC 1.007 03/03/2020 2028   PHURINE 5.0 03/03/2020 2028   GLUCOSEU >=500 (A) 03/03/2020 2028  HGBUR MODERATE (A) 03/03/2020 2028   BILIRUBINUR NEGATIVE 03/03/2020 2028   KETONESUR NEGATIVE 03/03/2020 2028   PROTEINUR 100 (A) 03/03/2020 2028   NITRITE NEGATIVE 03/03/2020 2028   LEUKOCYTESUR NEGATIVE 03/03/2020 2028    Radiological Exams on Admission: No results found.  EKG: Independently reviewed.  Assessment/Plan Principal Problem:   Hyponatremia Active Problems:   CKD (chronic kidney disease) stage 5, GFR 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 (HCC)    Hyponatremia - Nephrology consulted 500cc NS bolus Re-check sodium They will consult Urine sodium pending BMP Q6H ordered 1265ml dietary fluid restriction ordered for now Holding lasix CKD 5 - Advised pt to take PO bicarb as scheduled BID (not PRN). Creat seems stable. Nephro consult as above Cold intolerance - TSH already checked by PCP just last week and normal per pt.  DVT prophylaxis: Heparin [place] Code Status: Full Family  Communication: No family in room Disposition Plan: Home after sodium improved Consults called: Nephrology called by EDP Admission status: Place in obs - likely IP conversion tomorrow     Eufemio Strahm, [facility]  How to contact the [facility] Attending or Consulting provider [place] or covering provider during after hours [place], for this patient?  Check the care team in [facility] and look for a) attending/consulting [facility] provider listed and b) the [facility] team listed Log into [email or web address]  Amion Physician Scheduling and messaging for groups and whole hospitals  On call and physician scheduling software for group practices, residents, hospitalists and other medical providers for call, clinic, rotation and shift schedules. OnCall [employer] is a hospital-wide system for scheduling doctors and paging doctors on call. EasyPlot is for scientific plotting and data analysis.  [email or web address]  and use [place]'s universal password to access. If you do not have the password, please contact the hospital operator.  Locate the [facility] provider you are looking for under [facility] and page to a number that you can be directly reached. If you still have difficulty reaching the provider, please page the [facility] (Director on Call) for the Hospitalists listed on amion for assistance.  03/26/2021, 8:26 PM

## 2021-03-26 NOTE — ED Provider Notes (Signed)
[facility] DEPT Provider Note   CSN: [id number] Arrival date & time: 03/26/21  1339     History Chief Complaint  Patient presents with   Abnormal Lab    Low Na    Benjamin [name] is a 65 y.o. male.  HPI Patient is a 65 year old male with a history of acute renal failure, diabetes mellitus, hypertension, hyponatremia, who presents to the emergency department due to an abnormal lab.  He is followed by Dr. Royce Macadamia with nephrology due to his CKD.  He was last evaluated yesterday and had his labs redrawn and was told today that his sodium was low once again.  He was seen last week with the same complaint.  During his visit last week he felt that he was at his baseline but also noted drinking between 60 to 90 ounces of water per day.  He was also taking Lasix at the time.  He was given a liter of normal saline and instructed to reduce his water intake as well as discontinue his Lasix.  Patient once again states that he is feeling at his baseline.  States he has having no difficulty making urine.  No nausea, vomiting, chest pain, shortness of breath, muscle cramping, neurological complaints.    Past Medical History:  Diagnosis Date   AKI (acute kidney injury) ([place]) 02/2020   Diabetes mellitus without complication ([place])    Hypertension     Patient Active Problem List   Diagnosis Date Noted   Acute renal failure (ARF) ([place]) 03/04/2020   Hyperkalemia 26/71/2458   Metabolic acidosis 09/98/3382   Hypertensive urgency 03/04/2020   Anemia 03/04/2020    Past Surgical History:  Procedure Laterality Date   WISDOM TOOTH EXTRACTION         Family History  Problem Relation Age of Onset   Stroke Mother    Stroke Father     Social History   Tobacco Use   Smoking status: Never   Smokeless tobacco: Current    Types: Chew  Vaping Use   Vaping Use: Never used  Substance Use Topics   Alcohol use: Not Currently   Drug use: Never    Home  Medications Prior to Admission medications   Medication Sig Start Date End Date Taking? Authorizing Provider  albuterol (VENTOLIN HFA) 108 (90 Base) MCG/ACT inhaler Can inhale two puffs every four to six hours as needed for cough, wheeze, shortness of breath, or chest tightness. 03/26/20   Kozlow, Donnamarie Poag, MD  Bepotastine Besilate (BEPREVE) 1.5 % SOLN Place 1 drop into both eyes daily as needed. 09/23/20   Kennith Gain, MD  budesonide-formoterol [facility]) 160-4.5 MCG/ACT inhaler Inhale 2 puffs into the lungs 2 (two) times daily. Rinse, gargle and spit after use. 06/24/20   Kennith Gain, MD  furosemide (LASIX) 40 MG tablet Take by mouth. Three times a week 03/20/20   [provider]  ipratropium (ATROVENT) 0.06 % nasal spray INSTILL 2 SPRAY INTO BOTH NOSTRILS TWICEDAILY 11/04/20   Kennith Gain, MD  levalbuterol [facility] HFA) 45 MCG/ACT inhaler Can inhale two puffs every four to six hours as needed for cough or wheeze. 03/18/20   Kennith Gain, MD  sevelamer carbonate (RENVELA) 800 MG tablet Take 1 tablet (800 mg total) by mouth 3 (three) times daily with meals. 03/12/20   Shelly Coss, MD  sodium bicarbonate 650 MG tablet Take 1 tablet (650 mg total) by mouth 2 (two) times daily. 03/12/20   Shelly Coss, MD  Vitamin D, Ergocalciferol, (DRISDOL) 1.25 MG (50000 UNIT) CAPS capsule Take by mouth. 09/09/20   [provider]    Allergies    Grass pollen(k-o-r-t-swt vern)  Review of Systems   Review of Systems  All other systems reviewed and are negative. Ten systems reviewed and are negative for acute change, except as noted in the HPI.   Physical Exam Updated Vital Signs BP 140/87 (BP Location: Left Arm)   Pulse 66   Temp 97.9 F (36.6 C) (Oral)   Resp 18   SpO2 98%   Physical Exam Vitals and nursing note reviewed.  Constitutional:      General: He is not in acute distress.    Appearance: Normal appearance. He is not  ill-appearing, toxic-appearing or diaphoretic.  HENT:     Head: Normocephalic and atraumatic.     Right Ear: External ear normal.     Left Ear: External ear normal.     Nose: Nose normal.     Mouth/Throat:     Mouth: Mucous membranes are moist.     Pharynx: Oropharynx is clear. No oropharyngeal exudate or posterior oropharyngeal erythema.  Eyes:     Extraocular Movements: Extraocular movements intact.  Cardiovascular:     Rate and Rhythm: Normal rate and regular rhythm.     Pulses: Normal pulses.     Heart sounds: Normal heart sounds. No murmur heard.   No friction rub. No gallop.  Pulmonary:     Effort: Pulmonary effort is normal. No respiratory distress.     Breath sounds: Normal breath sounds. No stridor. No wheezing, rhonchi or rales.  Abdominal:     General: Abdomen is flat.     Palpations: Abdomen is soft.     Tenderness: There is no abdominal tenderness.     Comments: Abdomen is flat, soft, and nontender.  Musculoskeletal:        General: Normal range of motion.     Cervical back: Normal range of motion and neck supple. No tenderness.     Right lower leg: Edema present.     Left lower leg: Edema present.     Comments: 1+ pitting edema noted in the bilateral lower extremities.  Skin:    General: Skin is warm and dry.  Neurological:     General: No focal deficit present.     Mental Status: He is alert and oriented to person, place, and time.  Psychiatric:        Mood and Affect: Mood normal.        Behavior: Behavior normal.    ED Results / Procedures / Treatments   Labs (all labs ordered are listed, but only abnormal results are displayed) Labs Reviewed  CBC WITH DIFFERENTIAL/PLATELET - Abnormal; Notable for the following components:      Result Value   RDW 15.7 (*)    All other components within normal limits  COMPREHENSIVE METABOLIC PANEL - Abnormal; Notable for the following components:   Sodium 117 (*)    Chloride 86 (*)    CO2 18 (*)    Glucose, Bld 52  (*)    BUN 53 (*)    Creatinine, Ser 4.20 (*)    Calcium 8.8 (*)    Albumin 3.2 (*)    Total Bilirubin 3.2 (*)    GFR, Estimated 15 (*)    All other components within normal limits  RESP PANEL BY RT-PCR (FLU A&B, COVID) ARPGX2  URINALYSIS, ROUTINE W REFLEX MICROSCOPIC  SODIUM, URINE, RANDOM  CREATININE,  URINE, RANDOM  OSMOLALITY, URINE   EKG None  Radiology No results found.  Procedures Procedures   Medications Ordered in ED Medications  sodium chloride 0.9 % bolus 500 mL (has no administration in time range)    ED Course  I have reviewed the triage vital signs and the nursing notes.  Pertinent labs & imaging results that were available during my care of the patient were reviewed by me and considered in my medical decision making (see chart for details).  Clinical Course as of 03/26/21 1840  Thu Mar 26, 2021  1818 Patient discussed with nephrology.  They agree with admission.  They recommend rechecking his metabolic panel after he completes his IV fluids. [LJ]    Clinical Course User Index [LJ] Moody Bruins   MDM Rules/Calculators/A&P                          Patient is a 65 year old male who presents to the emergency department due to hyponatremia.  Initially noted in the emergency department last week.  He had his Lasix DC'd and was told to restrict his elevated fluid intake.  He followed up with nephrology yesterday and was called today and told that he was once again hyponatremic and told to come to the emergency department.  Found to have a sodium of 117 today.  I have ordered a UA, urine sodium, urine creatinine, and urine osmolality, which are all pending.  We will give 500 cc of normal saline.  Patient discussed with nephrology.  They recommended rechecking patient's metabolic panel after he receives IV fluids.  They will evaluate the patient later tonight or tomorrow.  Given patient's worsening hyponatremia as well as his significant comorbidities  feel that admission is warranted for further evaluation.  Will discuss with the medicine team.  Final Clinical Impression(s) / ED Diagnoses Final diagnoses:  Hyponatremia  Stage 5 chronic kidney disease [facility])   Rx / DC Orders ED Discharge Orders     None        Rayna Sexton, PA-C 03/26/21 1840    [name], MD 03/26/21 2018

## 2021-03-26 NOTE — ED Notes (Signed)
This RN entered room to start IV and give pt fluids, pt began cursing about temperature in room and complaining about his wait time in [place]. This RN explained to pt I cannot control the temperature in each individual room, but I could bring him blankets, but to not curse at this RN. Will give patient a few minutes to calm down and stop cursing and go back and reassess pt

## 2021-03-26 NOTE — ED Triage Notes (Signed)
Sent by nephrologist for hyponatremia.

## 2021-03-27 ENCOUNTER — Encounter (HOSPITAL_COMMUNITY): Payer: Self-pay | Admitting: Internal Medicine

## 2021-03-27 DIAGNOSIS — Z823 Family history of stroke: Secondary | ICD-10-CM | POA: Diagnosis not present

## 2021-03-27 DIAGNOSIS — Z79899 Other long term (current) drug therapy: Secondary | ICD-10-CM | POA: Diagnosis not present

## 2021-03-27 DIAGNOSIS — S301XXA Contusion of abdominal wall, initial encounter: Secondary | ICD-10-CM | POA: Diagnosis not present

## 2021-03-27 DIAGNOSIS — W1830XA Fall on same level, unspecified, initial encounter: Secondary | ICD-10-CM | POA: Diagnosis not present

## 2021-03-27 DIAGNOSIS — D696 Thrombocytopenia, unspecified: Secondary | ICD-10-CM | POA: Diagnosis not present

## 2021-03-27 DIAGNOSIS — D631 Anemia in chronic kidney disease: Secondary | ICD-10-CM | POA: Diagnosis present

## 2021-03-27 DIAGNOSIS — E1122 Type 2 diabetes mellitus with diabetic chronic kidney disease: Secondary | ICD-10-CM | POA: Diagnosis present

## 2021-03-27 DIAGNOSIS — N185 Chronic kidney disease, stage 5: Secondary | ICD-10-CM | POA: Diagnosis not present

## 2021-03-27 DIAGNOSIS — F17220 Nicotine dependence, chewing tobacco, uncomplicated: Secondary | ICD-10-CM | POA: Diagnosis present

## 2021-03-27 DIAGNOSIS — E11649 Type 2 diabetes mellitus with hypoglycemia without coma: Secondary | ICD-10-CM | POA: Diagnosis not present

## 2021-03-27 DIAGNOSIS — E871 Hypo-osmolality and hyponatremia: Secondary | ICD-10-CM | POA: Diagnosis not present

## 2021-03-27 DIAGNOSIS — Z7951 Long term (current) use of inhaled steroids: Secondary | ICD-10-CM | POA: Diagnosis not present

## 2021-03-27 DIAGNOSIS — E872 Acidosis: Secondary | ICD-10-CM | POA: Diagnosis present

## 2021-03-27 DIAGNOSIS — E119 Type 2 diabetes mellitus without complications: Secondary | ICD-10-CM | POA: Diagnosis not present

## 2021-03-27 DIAGNOSIS — E861 Hypovolemia: Secondary | ICD-10-CM | POA: Diagnosis present

## 2021-03-27 DIAGNOSIS — E1110 Type 2 diabetes mellitus with ketoacidosis without coma: Secondary | ICD-10-CM | POA: Diagnosis not present

## 2021-03-27 DIAGNOSIS — Y92230 Patient room in hospital as the place of occurrence of the external cause: Secondary | ICD-10-CM | POA: Diagnosis present

## 2021-03-27 DIAGNOSIS — I120 Hypertensive chronic kidney disease with stage 5 chronic kidney disease or end stage renal disease: Secondary | ICD-10-CM | POA: Diagnosis not present

## 2021-03-27 DIAGNOSIS — Z20822 Contact with and (suspected) exposure to covid-19: Secondary | ICD-10-CM | POA: Diagnosis present

## 2021-03-27 DIAGNOSIS — E1129 Type 2 diabetes mellitus with other diabetic kidney complication: Secondary | ICD-10-CM | POA: Diagnosis not present

## 2021-03-27 DIAGNOSIS — E8770 Fluid overload, unspecified: Secondary | ICD-10-CM | POA: Diagnosis not present

## 2021-03-27 DIAGNOSIS — I10 Essential (primary) hypertension: Secondary | ICD-10-CM | POA: Diagnosis not present

## 2021-03-27 LAB — BASIC METABOLIC PANEL
Anion gap: 11 (ref 5–15)
Anion gap: 11 (ref 5–15)
Anion gap: 13 (ref 5–15)
BUN: 67 mg/dL — ABNORMAL HIGH (ref 8–23)
BUN: 80 mg/dL — ABNORMAL HIGH (ref 8–23)
BUN: 76 mg/dL — ABNORMAL HIGH (ref 8–23)
CO2: 21 mmol/L — ABNORMAL LOW (ref 22–32)
CO2: 19 mmol/L — ABNORMAL LOW (ref 22–32)
CO2: 19 mmol/L — ABNORMAL LOW (ref 22–32)
Calcium: 8.9 mg/dL (ref 8.9–10.3)
Calcium: 8.8 mg/dL — ABNORMAL LOW (ref 8.9–10.3)
Calcium: 8.7 mg/dL — ABNORMAL LOW (ref 8.9–10.3)
Chloride: 88 mmol/L — ABNORMAL LOW (ref 98–111)
Chloride: 89 mmol/L — ABNORMAL LOW (ref 98–111)
Chloride: 86 mmol/L — ABNORMAL LOW (ref 98–111)
Creatinine, Ser: 4.4 mg/dL — ABNORMAL HIGH (ref 0.61–1.24)
Creatinine, Ser: 4.26 mg/dL — ABNORMAL HIGH (ref 0.61–1.24)
Creatinine, Ser: 4.26 mg/dL — ABNORMAL HIGH (ref 0.61–1.24)
GFR, Estimated: 14 mL/min — ABNORMAL LOW (ref >60)
GFR, Estimated: 15 mL/min — ABNORMAL LOW (ref >60)
GFR, Estimated: 15 mL/min — ABNORMAL LOW (ref >60)
Glucose, Bld: 69 mg/dL — ABNORMAL LOW (ref 70–99)
Glucose, Bld: 74 mg/dL (ref 70–99)
Glucose, Bld: 78 mg/dL (ref 70–99)
Potassium: 4.9 mmol/L (ref 3.5–5.1)
Potassium: 4.6 mmol/L (ref 3.5–5.1)
Potassium: 4.5 mmol/L (ref 3.5–5.1)
Sodium: 120 mmol/L — ABNORMAL LOW (ref 135–145)
Sodium: 119 mmol/L — CL (ref 135–145)
Sodium: 118 mmol/L — CL (ref 135–145)

## 2021-03-27 LAB — OSMOLALITY: Osmolality: 270 mOsm/kg — ABNORMAL LOW (ref 275–295)

## 2021-03-27 LAB — CORTISOL-AM, BLOOD: Cortisol - AM: 20.8 ug/dL (ref 6.7–22.6)

## 2021-03-27 LAB — GLUCOSE, CAPILLARY
Glucose-Capillary: 67 mg/dL — ABNORMAL LOW (ref 70–99)
Glucose-Capillary: 81 mg/dL (ref 70–99)

## 2021-03-27 LAB — HIV ANTIBODY (ROUTINE TESTING W REFLEX): HIV Screen 4th Generation wRfx: NONREACTIVE

## 2021-03-27 LAB — RESP PANEL BY RT-PCR (FLU A&B, COVID) ARPGX2
Influenza A by PCR: NEGATIVE
Influenza B by PCR: NEGATIVE
SARS Coronavirus 2 by RT PCR: NEGATIVE

## 2021-03-27 LAB — OSMOLALITY, URINE: Osmolality, Ur: 247 mOsm/kg — ABNORMAL LOW (ref 300–900)

## 2021-03-27 MED ORDER — SODIUM CHLORIDE 0.9 % IV SOLN: INTRAVENOUS | Status: DC

## 2021-03-27 MED ORDER — HYDROCORTISONE 0.5 % EX CREA
TOPICAL_CREAM | Freq: Two times a day (BID) | CUTANEOUS | Status: DC
  Filled 2021-03-27: qty 28.35

## 2021-03-27 NOTE — Progress Notes (Signed)
Patient ID: Benjamin [name], male   DOB: 08/29/1956, 65 y.o.   MRN: [id number] S:No events overnight O:BP 131/90 (BP Location: Left Arm)   Pulse 70   Temp (!) 97.5 F (36.4 C) (Oral)   Resp 18   Ht 6' (1.829 m)   Wt 88.7 kg   SpO2 100%   BMI 26.51 kg/m   Intake/Output Summary (Last 24 hours) at 03/27/2021 1400 Last data filed at 03/27/2021 0830 Gross per 24 hour  Intake --  Output 350 ml  Net -350 ml   Intake/Output: No intake/output data recorded.  Intake/Output this shift:  Total I/O In: -  Out: 350 [Urine:350] Weight change:  Physical exam: unable to complete due to possible COVID + status.  In order to preserve PPE equipment and to minimize exposure to providers.  Notes from other caregivers reviewed   Recent Labs  Lab 03/26/21 1412 03/26/21 2252 03/27/21 0349  NA 117* 119* 120*  K 4.5 4.6 4.9  CL 86* 89* 88*  CO2 18* 16* 21*  GLUCOSE 52* 47* 69*  BUN 53* 53* 67*  CREATININE 4.20* 4.15* 4.40*  ALBUMIN 3.2*  --   --   CALCIUM 8.8* 9.0 8.9  AST 37  --   --   ALT 15  --   --    Liver Function Tests: Recent Labs  Lab 03/26/21 1412  AST 37  ALT 15  ALKPHOS 117  BILITOT 3.2*  PROT 6.7  ALBUMIN 3.2*   No results for input(s): LIPASE, AMYLASE in the last 168 hours. No results for input(s): AMMONIA in the last 168 hours. CBC: Recent Labs  Lab 03/26/21 1412  WBC 4.0  NEUTROABS 2.8  HGB 14.4  HCT 43.2  MCV 88.7  PLT 174   Cardiac Enzymes: No results for input(s): CKTOTAL, CKMB, CKMBINDEX, TROPONINI in the last 168 hours. CBG: Recent Labs  Lab 03/27/21 1217  GLUCAP 67*    Iron Studies: No results for input(s): IRON, TIBC, TRANSFERRIN, FERRITIN in the last 72 hours. Studies/Results: No results found.  heparin  5,000 Units Subcutaneous Q8H   mometasone-formoterol  2 puff Inhalation BID   sodium bicarbonate  1,300 mg Oral TID    BMET    Component Value Date/Time   NA 120 (L) 03/27/2021 0349   K 4.9 03/27/2021 0349   CL 88 (L) 03/27/2021  0349   CO2 21 (L) 03/27/2021 0349   GLUCOSE 69 (L) 03/27/2021 0349   BUN 67 (H) 03/27/2021 0349   CREATININE 4.40 (H) 03/27/2021 0349   CREATININE 4.51 (HH) 04/10/2020 1350   CALCIUM 8.9 03/27/2021 0349   GFRNONAA 14 (L) 03/27/2021 0349   GFRNONAA 13 (L) 04/10/2020 1350   GFRAA 15 (L) 04/10/2020 1350   CBC    Component Value Date/Time   WBC 4.0 03/26/2021 1412   RBC 4.87 03/26/2021 1412   HGB 14.4 03/26/2021 1412   HCT 43.2 03/26/2021 1412   PLT 174 03/26/2021 1412   MCV 88.7 03/26/2021 1412   MCH 29.6 03/26/2021 1412   MCHC 33.3 03/26/2021 1412   RDW 15.7 (H) 03/26/2021 1412   LYMPHSABS 0.7 03/26/2021 1412   MONOABS 0.3 03/26/2021 1412   EOSABS 0.1 03/26/2021 1412   BASOSABS 0.0 03/26/2021 1412     Assessment/Plan:  Severe Hyponatremia, asymptomatic - urine studies consistent with hypovolemic hyponatremia (low UNa 13, low Uosm 224, Sosm 270).  Uosm is lower than Sosm but not maximally dillute due to advanced CKD.  Responding to IV NS and  will stop ure-Na tabs as this is not consistent with SIADH.  Continue to hold ACE-I and restart IV NS and follow sodium levels. CKD stage V - not yet on dialysis and is at his baseline Scr HTN - stable Respiratory isolation - still awaiting SARS screening Metabolic acidosis - increased NaHCO3 tabs and follow DM type 2 - per primary  Donetta Potts, MD [facility] [phone]

## 2021-03-27 NOTE — Progress Notes (Signed)
Pt had an unwitnessed fall in the room and was found by [place] , [place]. Pt states he had been up to the rest room and decided he wanted to put his sweat pants on. He leaned on the bedside table/tray for support while adjusting the pants, and the table , being that it was an unstable four wheeled piece of equipment , it slid and he fell when it slid. Pt states he did not hit his head, did side on the die bench and does have a new bruise to R flank not noted or reported before. Vitals were taken by NT, and MD notified, will continue to monitor.

## 2021-03-27 NOTE — Progress Notes (Signed)
   03/27/21 2113  Provider Notification  Provider Name/Title Dr Moshe Cipro  Date Provider Notified 03/27/21  Time Provider Notified 2114  Notification Type Page  Notification Reason Critical result  Test performed and critical result Sodium 118  Date Critical Result Received 03/27/21  Time Critical Result Received 2052  Provider response No new orders  Date of Provider Response 03/27/21  Time of Provider Response 2115

## 2021-03-27 NOTE — Progress Notes (Signed)
[facility] PROGRESS NOTE   Benjamin [name] HWE:[id number] DOB: April 07, 1956 DOA: 03/26/2021  PCP: Mateo Flow, MD  Brief History/Interval Summary: 65 y.o. 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  Patient underwent routine blood work in the outpatient setting where he was found to have a sodium level of 119.  He was sent over to the emergency department.  Subsequently hospitalized.   Reason for Visit: Hyponatremia  Consultants: Nephrology  Procedures: None yet  Antibiotics: Anti-infectives (From admission, onward)    None       Subjective/Interval History: Patient denies any headaches or confusion.  No nausea vomiting or diarrhea recently.  Informed by the nursing staff after rounds that patient had fall in the room without loss of consciousness.      Assessment/Plan:  Hyponatremia Noted to have a low sodium level of 117 initially.  Apparently given IV fluids with which it improved to 120.  Nephrology was consulted.  Await their input this morning.  Serum osmolality 270.  Cortisol 20.8.  TSH 3.2.  Urine osmolality 247.  Urine sodium 13 and urine creatinine 93.7.  Chronic kidney disease stage V Renal function seems to be close to baseline.  Followed by nephrology in the outpatient setting.  Monitor urine output.  Bicarbonate level noted to be better this morning.  Continue with oral sodium bicarbonate.  Also on urea oral packets.  Essential hypertension Not noted to be on any antihypertensives prior to admission.  Monitor blood pressures.  Diabetes mellitus type 2, diet controlled Glucose level noted to be low this morning.  We will asked nursing staff to recheck   DVT Prophylaxis: Subcutaneous heparin Code Status: Full code Family Communication: Discussed with the patient.  No family at bedside Disposition Plan: Hopefully return home in improved  Status is: Observation  The patient  will require care spanning > 2 midnights and should be moved to inpatient because: Ongoing diagnostic testing needed not appropriate for outpatient work up and Inpatient level of care appropriate due to severity of illness  Dispo: The patient is from: Home              Anticipated d/c is to: Home              Patient currently is not medically stable to d/c.   Difficult to place patient No         Medications: Scheduled:  heparin  5,000 Units Subcutaneous Q8H   mometasone-formoterol  2 puff Inhalation BID   sodium bicarbonate  1,300 mg Oral TID   urea  15 g Oral BID   Continuous: ELF:[id number] **OR** acetaminophen, albuterol, ipratropium, ondansetron **OR** ondansetron (ZOFRAN) IV, polyvinyl alcohol   Objective:  Vital Signs  Vitals:   03/26/21 1948 03/26/21 2201 03/27/21 0600 03/27/21 1000  BP: (!) 132/100 (!) 144/92 (!) 158/87 131/90  Pulse: 66 67 66 70  Resp: 18 20 18 18   Temp:  97.6 F (36.4 C) 97.9 F (36.6 C) (!) 97.5 F (36.4 C)  TempSrc:  Oral Oral Oral  SpO2: 100% 99% 99% 100%  Weight:  88.7 kg    Height:  6' (1.829 m)      Intake/Output Summary (Last 24 hours) at 03/27/2021 1059 Last data filed at 03/27/2021 0830 Gross per 24 hour  Intake --  Output 350 ml  Net -350 ml   Filed Weights   03/26/21 2201  Weight: 88.7 kg    General appearance:  Awake alert.  In no distress Resp: Clear to auscultation bilaterally.  Normal effort Cardio: S1-S2 is normal regular.  No S3-S4.  No rubs murmurs or bruit GI: Abdomen is soft.  Nontender nondistended.  Bowel sounds are present normal.  No masses organomegaly Extremities: 1+ edema bilateral lower extremities Neurologic: Alert and oriented x3.  No focal neurological deficits.    Lab Results:  Data Reviewed: I have personally reviewed following labs and imaging studies  CBC: Recent Labs  Lab 03/26/21 1412  WBC 4.0  NEUTROABS 2.8  HGB 14.4  HCT 43.2  MCV 88.7  PLT 309    Basic Metabolic  Panel: Recent Labs  Lab 03/26/21 1412 03/26/21 2252 03/27/21 0349  NA 117* 119* 120*  K 4.5 4.6 4.9  CL 86* 89* 88*  CO2 18* 16* 21*  GLUCOSE 52* 47* 69*  BUN 53* 53* 67*  CREATININE 4.20* 4.15* 4.40*  CALCIUM 8.8* 9.0 8.9    GFR: Estimated Creatinine Clearance: 18.9 mL/min (A) (by C-G formula based on SCr of 4.4 mg/dL (H)).  Liver Function Tests: Recent Labs  Lab 03/26/21 1412  AST 37  ALT 15  ALKPHOS 117  BILITOT 3.2*  PROT 6.7  ALBUMIN 3.2*     Thyroid Function Tests: Recent Labs    03/26/21 2252  TSH 3.258     Radiology Studies: No results found.     LOS: 0 days   Abbie Jablon [employer] on [email or web address]  03/27/2021, 10:59 AM

## 2021-03-28 LAB — BASIC METABOLIC PANEL
Anion gap: 13 (ref 5–15)
BUN: 81 mg/dL — ABNORMAL HIGH (ref 8–23)
CO2: 20 mmol/L — ABNORMAL LOW (ref 22–32)
Calcium: 8.8 mg/dL — ABNORMAL LOW (ref 8.9–10.3)
Chloride: 88 mmol/L — ABNORMAL LOW (ref 98–111)
Creatinine, Ser: 4.21 mg/dL — ABNORMAL HIGH (ref 0.61–1.24)
GFR, Estimated: 15 mL/min — ABNORMAL LOW (ref >60)
Glucose, Bld: 82 mg/dL (ref 70–99)
Potassium: 4.2 mmol/L (ref 3.5–5.1)
Sodium: 121 mmol/L — ABNORMAL LOW (ref 135–145)

## 2021-03-28 LAB — SODIUM
Sodium: 121 mmol/L — ABNORMAL LOW (ref 135–145)
Sodium: 122 mmol/L — ABNORMAL LOW (ref 135–145)

## 2021-03-28 MED ORDER — TRAZODONE HCL 50 MG PO TABS: 50.0000 mg | ORAL_TABLET | Freq: Every evening | ORAL | Status: DC | PRN

## 2021-03-28 MED ORDER — SODIUM CHLORIDE 0.9 % IV BOLUS
500.0000 mL | Freq: Once | INTRAVENOUS | Status: AC
  Administered 2021-03-28: 500 mL via INTRAVENOUS

## 2021-03-28 NOTE — Progress Notes (Signed)
[facility] PROGRESS NOTE   Benjamin [name] YYT:[id number] DOB: 1956-03-29 DOA: 03/26/2021  PCP: Mateo Flow, MD  Brief History/Interval Summary: 65 y.o. 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  Patient underwent routine blood work in the outpatient setting where he was found to have a sodium level of 119.  He was sent over to the emergency department.  Subsequently hospitalized.   Reason for Visit: Hyponatremia  Consultants: Nephrology  Procedures: None yet  Antibiotics: Anti-infectives (From admission, onward)    None       Subjective/Interval History: Patient cannot get much sleep last night.  Otherwise he denies any other complaints.  No nausea vomiting or diarrhea.  No shortness of breath.      Assessment/Plan:  Hyponatremia Noted to have a low sodium level of 117 initially.  There was some improvement after patient was given IV fluids.  Nephrology is following.  Placed on normal saline infusion.  Sodium level remains low.  Continue to monitor.  No neurological changes noted.   Etiology unclear but does not appear to be SIADH. Serum osmolality 270.  Cortisol 20.8.  TSH 3.2.  Urine osmolality 247.  Urine sodium 13 and urine creatinine 93.7.  Chronic kidney disease stage V Renal function seems to be close to baseline.  Followed by nephrology in the outpatient setting.  Monitor urine output.  Bicarbonate level noted to be stable.  Continue oral sodium bicarbonate   Essential hypertension Not noted to be on any antihypertensives prior to admission.  Occasional high readings noted.  Continue to monitor.    Diabetes mellitus type 2, diet controlled Noted to have hypoglycemia yesterday.  Seems to have improved after he consumed his meals.     DVT Prophylaxis: Subcutaneous heparin Code Status: Full code Family Communication: Discussed with the patient.  No family at bedside Disposition  Plan: Hopefully return home when improved  Status is: Inpatient  Remains inpatient appropriate because:IV treatments appropriate due to intensity of illness or inability to take PO and Inpatient level of care appropriate due to severity of illness  Dispo:  Patient From: Home  Planned Disposition: Home  Medically stable for discharge: No           Medications: Scheduled:  heparin  5,000 Units Subcutaneous Q8H   hydrocortisone cream   Topical BID   mometasone-formoterol  2 puff Inhalation BID   sodium bicarbonate  1,300 mg Oral TID   Continuous:  sodium chloride 50 mL/hr at 03/27/21 1830   EXN:[id number] **OR** acetaminophen, albuterol, ipratropium, ondansetron **OR** ondansetron (ZOFRAN) IV, polyvinyl alcohol   Objective:  Vital Signs  Vitals:   03/27/21 2036 03/27/21 2122 03/28/21 0558 03/28/21 0818  BP: (!) 136/92  (!) 142/87   Pulse: 74  69   Resp: 20  20   Temp: 97.6 F (36.4 C)  (!) 97.5 F (36.4 C)   TempSrc: Oral  Oral   SpO2: 99% 98% 97% 98%  Weight:      Height:        Intake/Output Summary (Last 24 hours) at 03/28/2021 1051 Last data filed at 03/28/2021 0800 Gross per 24 hour  Intake 550 ml  Output 700 ml  Net -150 ml    Filed Weights   03/26/21 2201  Weight: 88.7 kg    General appearance: Awake alert.  In no distress Resp: Clear to auscultation bilaterally.  Normal effort Cardio: S1-S2 is normal regular.  No S3-S4.  No rubs murmurs or bruit GI: Abdomen is soft.  Nontender nondistended.  Bowel sounds are present normal.  No masses organomegaly Extremities: Mild edema bilateral lower extremities.   Neurologic: Alert and oriented x3.  No focal neurological deficits.    Lab Results:  Data Reviewed: I have personally reviewed following labs and imaging studies  CBC: Recent Labs  Lab 03/26/21 1412  WBC 4.0  NEUTROABS 2.8  HGB 14.4  HCT 43.2  MCV 88.7  PLT 174     Basic Metabolic Panel: Recent Labs  Lab 03/26/21 2252  03/27/21 0349 03/27/21 1500 03/27/21 2010 03/28/21 0245 03/28/21 0857  NA 119* 120* 119* 118* 121* 121*  K 4.6 4.9 4.6 4.5 4.2  --   CL 89* 88* 89* 86* 88*  --   CO2 16* 21* 19* 19* 20*  --   GLUCOSE 47* 69* 74 78 82  --   BUN 53* 67* 80* 76* 81*  --   CREATININE 4.15* 4.40* 4.26* 4.26* 4.21*  --   CALCIUM 9.0 8.9 8.8* 8.7* 8.8*  --      GFR: Estimated Creatinine Clearance: 19.7 mL/min (A) (by C-G formula based on SCr of 4.21 mg/dL (H)).  Liver Function Tests: Recent Labs  Lab 03/26/21 1412  AST 37  ALT 15  ALKPHOS 117  BILITOT 3.2*  PROT 6.7  ALBUMIN 3.2*      Thyroid Function Tests: Recent Labs    03/26/21 2252  TSH 3.258      Radiology Studies: No results found.     LOS: 1 day   Labarron Durnin [employer] on [email or web address]  03/28/2021, 10:51 AM

## 2021-03-28 NOTE — Progress Notes (Signed)
Patient ID: Benjamin [name], male   DOB: 27-Jun-1956, 65 y.o.   MRN: [id number] S:No events overnight O:BP (!) 146/98 (BP Location: Right Arm)   Pulse 70   Temp (!) 97.5 F (36.4 C) (Oral)   Resp 18   Ht 6' (1.829 m)   Wt 88.7 kg   SpO2 99%   BMI 26.51 kg/m   Intake/Output Summary (Last 24 hours) at 03/28/2021 1357 Last data filed at 03/28/2021 1129 Gross per 24 hour  Intake 550 ml  Output 850 ml  Net -300 ml    Intake/Output: I/O last 3 completed shifts: In: 550 [I.V.:550] Out: 850 [Urine:850]  Intake/Output this shift:  Total I/O In: -  Out: 350 [Urine:350] Weight change:  Physical exam: unable to complete due to possible COVID + status.  In order to preserve PPE equipment and to minimize exposure to providers.  Notes from other caregivers reviewed   Recent Labs  Lab 03/26/21 1412 03/26/21 2252 03/27/21 0349 03/27/21 1500 03/27/21 2010 03/28/21 0245 03/28/21 0857  NA 117* 119* 120* 119* 118* 121* 121*  K 4.5 4.6 4.9 4.6 4.5 4.2  --   CL 86* 89* 88* 89* 86* 88*  --   CO2 18* 16* 21* 19* 19* 20*  --   GLUCOSE 52* 47* 69* 74 78 82  --   BUN 53* 53* 67* 80* 76* 81*  --   CREATININE 4.20* 4.15* 4.40* 4.26* 4.26* 4.21*  --   ALBUMIN 3.2*  --   --   --   --   --   --   CALCIUM 8.8* 9.0 8.9 8.8* 8.7* 8.8*  --   AST 37  --   --   --   --   --   --   ALT 15  --   --   --   --   --   --     Liver Function Tests: Recent Labs  Lab 03/26/21 1412  AST 37  ALT 15  ALKPHOS 117  BILITOT 3.2*  PROT 6.7  ALBUMIN 3.2*    No results for input(s): LIPASE, AMYLASE in the last 168 hours. No results for input(s): AMMONIA in the last 168 hours. CBC: Recent Labs  Lab 03/26/21 1412  WBC 4.0  NEUTROABS 2.8  HGB 14.4  HCT 43.2  MCV 88.7  PLT 174    Cardiac Enzymes: No results for input(s): CKTOTAL, CKMB, CKMBINDEX, TROPONINI in the last 168 hours. CBG: Recent Labs  Lab 03/27/21 1217 03/27/21 1655  GLUCAP 67* 81     Iron Studies: No results for input(s): IRON,  TIBC, TRANSFERRIN, FERRITIN in the last 72 hours. Studies/Results: No results found.  heparin  5,000 Units Subcutaneous Q8H   hydrocortisone cream   Topical BID   mometasone-formoterol  2 puff Inhalation BID   sodium bicarbonate  1,300 mg Oral TID    BMET    Component Value Date/Time   NA 121 (L) 03/28/2021 0857   K 4.2 03/28/2021 0245   CL 88 (L) 03/28/2021 0245   CO2 20 (L) 03/28/2021 0245   GLUCOSE 82 03/28/2021 0245   BUN 81 (H) 03/28/2021 0245   CREATININE 4.21 (H) 03/28/2021 0245   CREATININE 4.51 (HH) 04/10/2020 1350   CALCIUM 8.8 (L) 03/28/2021 0245   GFRNONAA 15 (L) 03/28/2021 0245   GFRNONAA 13 (L) 04/10/2020 1350   GFRAA 15 (L) 04/10/2020 1350   CBC    Component Value Date/Time   WBC 4.0 03/26/2021 1412  RBC 4.87 03/26/2021 1412   HGB 14.4 03/26/2021 1412   HCT 43.2 03/26/2021 1412   PLT 174 03/26/2021 1412   MCV 88.7 03/26/2021 1412   MCH 29.6 03/26/2021 1412   MCHC 33.3 03/26/2021 1412   RDW 15.7 (H) 03/26/2021 1412   LYMPHSABS 0.7 03/26/2021 1412   MONOABS 0.3 03/26/2021 1412   EOSABS 0.1 03/26/2021 1412   BASOSABS 0.0 03/26/2021 1412     Assessment/Plan:  Severe Hyponatremia, asymptomatic - urine studies consistent with hypovolemic hyponatremia (UNa 13, Uosm 224).  Suspect lasix is primary cause of hypovolemia.  DC'd ure-Na tabs as this is not consistent with SIADH.  Continue to hold ACE-I. Will continue NS 0.9% and ^ rate to 100 cc/hr. When Na is over 127 range he should be okay for dc home off of po lasix.  CKD stage V - not yet on dialysis and is at his baseline Scr HTN - stable Respiratory isolation - COVID negative Metabolic acidosis - increased NaHCO3 tabs and follow DM 2  Kelly Splinter, MD 03/28/2021, 2:01 PM

## 2021-03-29 DIAGNOSIS — I10 Essential (primary) hypertension: Secondary | ICD-10-CM

## 2021-03-29 DIAGNOSIS — E119 Type 2 diabetes mellitus without complications: Secondary | ICD-10-CM

## 2021-03-29 LAB — BASIC METABOLIC PANEL
Anion gap: 14 (ref 5–15)
Anion gap: 11 (ref 5–15)
BUN: 62 mg/dL — ABNORMAL HIGH (ref 8–23)
BUN: 61 mg/dL — ABNORMAL HIGH (ref 8–23)
CO2: 18 mmol/L — ABNORMAL LOW (ref 22–32)
CO2: 20 mmol/L — ABNORMAL LOW (ref 22–32)
Calcium: 8.5 mg/dL — ABNORMAL LOW (ref 8.9–10.3)
Calcium: 8.5 mg/dL — ABNORMAL LOW (ref 8.9–10.3)
Chloride: 89 mmol/L — ABNORMAL LOW (ref 98–111)
Chloride: 90 mmol/L — ABNORMAL LOW (ref 98–111)
Creatinine, Ser: 3.58 mg/dL — ABNORMAL HIGH (ref 0.61–1.24)
Creatinine, Ser: 3.91 mg/dL — ABNORMAL HIGH (ref 0.61–1.24)
GFR, Estimated: 18 mL/min — ABNORMAL LOW (ref >60)
GFR, Estimated: 16 mL/min — ABNORMAL LOW (ref >60)
Glucose, Bld: 91 mg/dL (ref 70–99)
Glucose, Bld: 96 mg/dL (ref 70–99)
Potassium: 4.5 mmol/L (ref 3.5–5.1)
Potassium: 4.2 mmol/L (ref 3.5–5.1)
Sodium: 121 mmol/L — ABNORMAL LOW (ref 135–145)
Sodium: 121 mmol/L — ABNORMAL LOW (ref 135–145)

## 2021-03-29 LAB — SODIUM, URINE, RANDOM: Sodium, Ur: 13 mmol/L

## 2021-03-29 LAB — HEPATIC FUNCTION PANEL
ALT: 18 U/L (ref 0–44)
AST: 43 U/L — ABNORMAL HIGH (ref 15–41)
Albumin: 3 g/dL — ABNORMAL LOW (ref 3.5–5.0)
Alkaline Phosphatase: 112 U/L (ref 38–126)
Bilirubin, Direct: 1.7 mg/dL — ABNORMAL HIGH (ref 0.0–0.2)
Indirect Bilirubin: 1.4 mg/dL — ABNORMAL HIGH (ref 0.3–0.9)
Total Bilirubin: 3.1 mg/dL — ABNORMAL HIGH (ref 0.3–1.2)
Total Protein: 6.4 g/dL — ABNORMAL LOW (ref 6.5–8.1)

## 2021-03-29 LAB — GLUCOSE, CAPILLARY
Glucose-Capillary: 98 mg/dL (ref 70–99)
Glucose-Capillary: 139 mg/dL — ABNORMAL HIGH (ref 70–99)
Glucose-Capillary: 120 mg/dL — ABNORMAL HIGH (ref 70–99)

## 2021-03-29 LAB — CREATININE, URINE, RANDOM: Creatinine, Urine: 41.75 mg/dL

## 2021-03-29 LAB — SODIUM: Sodium: 126 mmol/L — ABNORMAL LOW (ref 135–145)

## 2021-03-29 LAB — OSMOLALITY, URINE: Osmolality, Ur: 171 mOsm/kg — ABNORMAL LOW (ref 300–900)

## 2021-03-29 MED ORDER — TOLVAPTAN 15 MG PO TABS
15.0000 mg | ORAL_TABLET | Freq: Once | ORAL | Status: AC
  Administered 2021-03-29: 15 mg via ORAL
  Filled 2021-03-29: qty 1

## 2021-03-29 NOTE — Progress Notes (Signed)
Patient refused 1400 dose of Heparin SQ. Education provided. MD [place] Pink notified.

## 2021-03-29 NOTE — Progress Notes (Signed)
[facility] PROGRESS NOTE   Benjamin [name] ZPH:[id number] DOB: 1955/11/06 DOA: 03/26/2021  PCP: Mateo Flow, MD  Brief History/Interval Summary: 65 y.o. 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  Patient underwent routine blood work in the outpatient setting where he was found to have a sodium level of 119.  He was sent over to the emergency department.  Subsequently hospitalized.   Reason for Visit: Hyponatremia  Consultants: Nephrology  Procedures: None yet  Antibiotics: Anti-infectives (From admission, onward)    None       Subjective/Interval History: Patient continues to have insomnia.  He says that he did not take the trazodone last night as he was hoping that he would be able to go home today.  No other complaints offered.  No shortness of breath or chest pain.  No headaches.    Assessment/Plan:  Hyponatremia Noted to have a low sodium level of 117 initially.  There was some improvement after patient was given IV fluids.  Nephrology is following.  Placed on normal saline infusion.  Sodium level remains low.  Continue to monitor.  No neurological changes noted.   Hyponatremia is thought to be secondary to diuretic use.  SIADH is ruled out. Patient started on IV fluids by nephrology.  Sodium level however remains low.  Nephrology continues to manage.   Serum osmolality 270.  Cortisol 20.8.  TSH 3.2.  Urine osmolality 247.  Urine sodium 13 and urine creatinine 93.7.  Chronic kidney disease stage V Renal function seems to be close to baseline.  Followed by nephrology in the outpatient setting.  Monitor urine output.  Bicarbonate level noted to be stable.  Continue oral sodium bicarbonate   Essential hypertension Not noted to be on any antihypertensives prior to admission.  Occasional high readings noted.  Continue to monitor.    Diabetes mellitus type 2, diet controlled/hypoglycemia Noted to  have low glucose levels on 7/22.  Likely due to poor oral intake.  Subsequently they have been low normal.       DVT Prophylaxis: Subcutaneous heparin Code Status: Full code Family Communication: Discussed with the patient.  No family at bedside Disposition Plan: Hopefully return home when improved.  Continue to mobilize.  Status is: Inpatient  Remains inpatient appropriate because:IV treatments appropriate due to intensity of illness or inability to take PO and Inpatient level of care appropriate due to severity of illness  Dispo:  Patient From: Home  Planned Disposition: Home  Medically stable for discharge: No           Medications: Scheduled:  heparin  5,000 Units Subcutaneous Q8H   hydrocortisone cream   Topical BID   mometasone-formoterol  2 puff Inhalation BID   sodium bicarbonate  1,300 mg Oral TID   Continuous:  sodium chloride 75 mL/hr at 03/29/21 8016   PVV:[id number] **OR** acetaminophen, albuterol, ipratropium, ondansetron **OR** ondansetron (ZOFRAN) IV, polyvinyl alcohol, traZODone   Objective:  Vital Signs  Vitals:   03/28/21 2010 03/28/21 2031 03/29/21 0432 03/29/21 0855  BP:  (!) 145/96 (!) 142/91   Pulse:  79 78   Resp:  18 20   Temp:  97.9 F (36.6 C) 97.6 F (36.4 C)   TempSrc:  Axillary Axillary   SpO2: 98% 100% 98% 98%  Weight:   90 kg   Height:        Intake/Output Summary (Last 24 hours) at 03/29/2021 0947 Last data filed at 03/29/2021  2263 Gross per 24 hour  Intake 1616.11 ml  Output 800 ml  Net 816.11 ml    Filed Weights   03/26/21 2201 03/29/21 0432  Weight: 88.7 kg 90 kg    General appearance: Awake alert.  In no distress Resp: Clear to auscultation bilaterally.  Normal effort Cardio: S1-S2 is normal regular.  No S3-S4.  No rubs murmurs or bruit GI: Abdomen is soft.  Nontender nondistended.  Bowel sounds are present normal.  No masses organomegaly Extremities: Edema noted bilateral lower extremities Neurologic: Alert  and oriented x3.  No focal neurological deficits.    Lab Results:  Data Reviewed: I have personally reviewed following labs and imaging studies  CBC: Recent Labs  Lab 03/26/21 1412  WBC 4.0  NEUTROABS 2.8  HGB 14.4  HCT 43.2  MCV 88.7  PLT 174     Basic Metabolic Panel: Recent Labs  Lab 03/27/21 1500 03/27/21 2010 03/28/21 0245 03/28/21 0857 03/28/21 1528 03/29/21 0423 03/29/21 0821  NA 119* 118* 121* 121* 122* 121* 121*  K 4.6 4.5 4.2  --   --  4.5 4.2  CL 89* 86* 88*  --   --  89* 90*  CO2 19* 19* 20*  --   --  18* 20*  GLUCOSE 74 78 82  --   --  91 96  BUN 80* 76* 81*  --   --  62* 61*  CREATININE 4.26* 4.26* 4.21*  --   --  3.58* 3.91*  CALCIUM 8.8* 8.7* 8.8*  --   --  8.5* 8.5*     GFR: Estimated Creatinine Clearance: 21.2 mL/min (A) (by C-G formula based on SCr of 3.91 mg/dL (H)).  Liver Function Tests: Recent Labs  Lab 03/26/21 1412 03/29/21 0821  AST 37 43*  ALT 15 18  ALKPHOS 117 112  BILITOT 3.2* 3.1*  PROT 6.7 6.4*  ALBUMIN 3.2* 3.0*      Thyroid Function Tests: Recent Labs    03/26/21 2252  TSH 3.258      Radiology Studies: No results found.     LOS: 2 days   [facility] Pager on [email or web address]  03/29/2021, 9:47 AM

## 2021-03-29 NOTE — Progress Notes (Signed)
Patient ID: KYRIAKOS BABLER, male   DOB: 1956/05/26, 65 y.o.   MRN: [id number] S: Na stalled out at 121 today. Pt w/o any new c/o's.  Ankles are starting to swell.    O:BP 139/90 (BP Location: Right Arm)   Pulse 80   Temp 97.6 F (36.4 C) (Axillary)   Resp 18   Ht 6' (1.829 m)   Wt 90 kg   SpO2 98%   BMI 26.91 kg/m   Intake/Output Summary (Last 24 hours) at 03/29/2021 1408 Last data filed at 03/29/2021 1315 Gross per 24 hour  Intake 3265.01 ml  Output 800 ml  Net 2465.01 ml    Intake/Output: I/O last 3 completed shifts: In: 3367.9 [P.O.:500; I.V.:2367.9; IV Piggyback:500] Out: 1500 [Urine:1500]  Intake/Output this shift:  Total I/O In: 447.2 [P.O.:120; I.V.:327.2] Out: 150 [Urine:150] Weight change:  Physical exam:   alert, nad   no jvd  Chest cta bilat  Cor reg no RG  Abd soft ntnd no ascites   Ext 2+ ankle/ pretibial edema bilat   Alert, NF, ox3    Recent Labs  Lab 03/26/21 1412 03/26/21 2252 03/27/21 0349 03/27/21 1500 03/27/21 2010 03/28/21 0245 03/28/21 0857 03/28/21 1528 03/29/21 0423 03/29/21 0821  NA 117* 119* 120* 119* 118* 121* 121* 122* 121* 121*  K 4.5 4.6 4.9 4.6 4.5 4.2  --   --  4.5 4.2  CL 86* 89* 88* 89* 86* 88*  --   --  89* 90*  CO2 18* 16* 21* 19* 19* 20*  --   --  18* 20*  GLUCOSE 52* 47* 69* 74 78 82  --   --  91 96  BUN 53* 53* 67* 80* 76* 81*  --   --  62* 61*  CREATININE 4.20* 4.15* 4.40* 4.26* 4.26* 4.21*  --   --  3.58* 3.91*  ALBUMIN 3.2*  --   --   --   --   --   --   --   --  3.0*  CALCIUM 8.8* 9.0 8.9 8.8* 8.7* 8.8*  --   --  8.5* 8.5*  AST 37  --   --   --   --   --   --   --   --  43*  ALT 15  --   --   --   --   --   --   --   --  18    Liver Function Tests:  Iron Studies: No results for input(s): IRON, TIBC, TRANSFERRIN, FERRITIN in the last 72 hours. Studies/Results: No results found.  heparin  5,000 Units Subcutaneous Q8H   hydrocortisone cream   Topical BID   mometasone-formoterol  2 puff Inhalation BID    sodium bicarbonate  1,300 mg Oral TID   Assessment/Plan:  Severe Hyponatremia, asymptomatic - urine studies consistent with hypovolemic hyponatremia (UNa 13, Uosm 224).  He improved w/ 0.9% saline from 117 to 121, but now has stalled at 121.  Legs are edematous now. No longer vol depleted. May be diast HF / prerenal now vs some sort of ADH excess.  Will start tolvaptan at 15 mg per day, dc IVF"s and resume fluid/ salt restriction.  CKD stage V - baseline creat high 3's to low 4's.  Creat labs here are similar. Concerning is reports of growing fatigue at home, and some poor appetite (eats breakfast only), and occasional vomiting after meals at home, these provided by the son and some by the patient.  Some of this may be uremia. Have d/w them that we may need to consider HD initiation while he is there.   HTN - stable Respiratory isolation - COVID negative Metabolic acidosis - NaHCO3 tabs and follow DM 2  Kelly Splinter, MD 03/29/2021, 2:08 PM

## 2021-03-30 DIAGNOSIS — E1129 Type 2 diabetes mellitus with other diabetic kidney complication: Secondary | ICD-10-CM

## 2021-03-30 LAB — BASIC METABOLIC PANEL
Anion gap: 11 (ref 5–15)
Anion gap: 11 (ref 5–15)
BUN: 60 mg/dL — ABNORMAL HIGH (ref 8–23)
BUN: 59 mg/dL — ABNORMAL HIGH (ref 8–23)
CO2: 21 mmol/L — ABNORMAL LOW (ref 22–32)
CO2: 22 mmol/L (ref 22–32)
Calcium: 8.3 mg/dL — ABNORMAL LOW (ref 8.9–10.3)
Calcium: 8.8 mg/dL — ABNORMAL LOW (ref 8.9–10.3)
Chloride: 90 mmol/L — ABNORMAL LOW (ref 98–111)
Chloride: 93 mmol/L — ABNORMAL LOW (ref 98–111)
Creatinine, Ser: 3.73 mg/dL — ABNORMAL HIGH (ref 0.61–1.24)
Creatinine, Ser: 4.01 mg/dL — ABNORMAL HIGH (ref 0.61–1.24)
GFR, Estimated: 17 mL/min — ABNORMAL LOW (ref >60)
GFR, Estimated: 16 mL/min — ABNORMAL LOW (ref >60)
Glucose, Bld: 88 mg/dL (ref 70–99)
Glucose, Bld: 103 mg/dL — ABNORMAL HIGH (ref 70–99)
Potassium: 4 mmol/L (ref 3.5–5.1)
Potassium: 4.1 mmol/L (ref 3.5–5.1)
Sodium: 122 mmol/L — ABNORMAL LOW (ref 135–145)
Sodium: 126 mmol/L — ABNORMAL LOW (ref 135–145)

## 2021-03-30 LAB — HEPATIC FUNCTION PANEL
ALT: 18 U/L (ref 0–44)
AST: 45 U/L — ABNORMAL HIGH (ref 15–41)
Albumin: 3.3 g/dL — ABNORMAL LOW (ref 3.5–5.0)
Alkaline Phosphatase: 123 U/L (ref 38–126)
Bilirubin, Direct: 1.8 mg/dL — ABNORMAL HIGH (ref 0.0–0.2)
Indirect Bilirubin: 1.5 mg/dL — ABNORMAL HIGH (ref 0.3–0.9)
Total Bilirubin: 3.3 mg/dL — ABNORMAL HIGH (ref 0.3–1.2)
Total Protein: 7.1 g/dL (ref 6.5–8.1)

## 2021-03-30 LAB — SODIUM
Sodium: 122 mmol/L — ABNORMAL LOW (ref 135–145)
Sodium: 124 mmol/L — ABNORMAL LOW (ref 135–145)

## 2021-03-30 LAB — GLUCOSE, CAPILLARY
Glucose-Capillary: 92 mg/dL (ref 70–99)
Glucose-Capillary: 100 mg/dL — ABNORMAL HIGH (ref 70–99)
Glucose-Capillary: 112 mg/dL — ABNORMAL HIGH (ref 70–99)

## 2021-03-30 MED ORDER — MELATONIN 3 MG PO TABS
3.0000 mg | ORAL_TABLET | Freq: Once | ORAL | Status: AC
  Administered 2021-03-30: 3 mg via ORAL
  Filled 2021-03-30: qty 1

## 2021-03-30 MED ORDER — FUROSEMIDE 10 MG/ML IJ SOLN
80.0000 mg | Freq: Two times a day (BID) | INTRAMUSCULAR | Status: DC
  Administered 2021-03-30: 80 mg via INTRAVENOUS
  Filled 2021-03-30: qty 8

## 2021-03-30 MED ORDER — TOLVAPTAN 15 MG PO TABS
30.0000 mg | ORAL_TABLET | Freq: Once | ORAL | Status: AC
  Administered 2021-03-30: 30 mg via ORAL
  Filled 2021-03-30: qty 2

## 2021-03-30 NOTE — Progress Notes (Signed)
[facility] PROGRESS NOTE   GEN CLAGG NTI:[id number] DOB: Oct 04, 1955 DOA: 03/26/2021  PCP: Mateo Flow, MD  Brief History/Interval Summary: 65 y.o. 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  Patient underwent routine blood work in the outpatient setting where he was found to have a sodium level of 119.  He was sent over to the emergency department.  Subsequently hospitalized.   Reason for Visit: Hyponatremia  Consultants: Nephrology  Procedures: None yet  Antibiotics: Anti-infectives (From admission, onward)    None       Subjective/Interval History: Patient continues to be frustrated about lack of improvement in his sodium levels.  Continues to feel fatigued with poor sleep overnight.  No chest pain or shortness of breath.  No nausea or vomiting.   Assessment/Plan:  Hyponatremia Noted to have a low sodium level of 117 initially.  There was some improvement after patient was given IV fluids.  Nephrology is following.  Placed on normal saline infusion.  Sodium level remains low.  Continue to monitor.  No neurological changes noted.   Hyponatremia is thought to be secondary to diuretic use.  SIADH is ruled out. Patient was started on IV fluids with no significant improvement in sodium levels.  Started on tolvaptan by nephrology yesterday.  Sodium levels again have not shown consistent improvement as yet.   Serum osmolality 270.  Cortisol 20.8.  TSH 3.2.  Urine osmolality 247.  Urine sodium 13 and urine creatinine 93.7.  Chronic kidney disease stage V Renal function seems to be close to baseline.  Followed by nephrology in the outpatient setting.  Monitor urine output.  Bicarbonate level noted to be stable.  Continue oral sodium bicarbonate   Essential hypertension Not noted to be on any antihypertensives prior to admission.  Occasional high readings noted.  Continue to monitor.    Diabetes  mellitus type 2, diet controlled/hypoglycemia Noted to have low glucose levels on 7/22.  Likely due to poor oral intake.  Subsequently improved.      DVT Prophylaxis: Subcutaneous heparin Code Status: Full code Family Communication: Discussed with the patient.  No family at bedside Disposition Plan: Hopefully return home when improved.  Continue to mobilize.  Status is: Inpatient  Remains inpatient appropriate because:IV treatments appropriate due to intensity of illness or inability to take PO and Inpatient level of care appropriate due to severity of illness  Dispo:  Patient From: Home  Planned Disposition: Home  Medically stable for discharge: No           Medications: Scheduled:  furosemide  80 mg Intravenous BID   heparin  5,000 Units Subcutaneous Q8H   hydrocortisone cream   Topical BID   mometasone-formoterol  2 puff Inhalation BID   sodium bicarbonate  1,300 mg Oral TID   Continuous:  QQP:[id number] **OR** acetaminophen, albuterol, ipratropium, ondansetron **OR** ondansetron (ZOFRAN) IV, polyvinyl alcohol, traZODone   Objective:  Vital Signs  Vitals:   03/29/21 1315 03/29/21 2044 03/30/21 0459 03/30/21 0744  BP: 139/90 (!) 141/93 (!) 142/88   Pulse: 80 79 72   Resp: 18 19 19    Temp: 97.6 F (36.4 C) 97.6 F (36.4 C) 97.7 F (36.5 C)   TempSrc: Axillary Oral Axillary   SpO2: 98% 98% 98% 98%  Weight:   90.9 kg   Height:        Intake/Output Summary (Last 24 hours) at 03/30/2021 1123 Last data filed at 03/30/2021 5809 Gross  per 24 hour  Intake 1247.16 ml  Output 900 ml  Net 347.16 ml    Filed Weights   03/29/21 0432 03/29/21 0500 03/30/21 0459  Weight: 90 kg 90 kg 90.9 kg    General appearance: Awake alert.  In no distress Resp: Clear to auscultation bilaterally.  Normal effort Cardio: S1-S2 is normal regular.  No S3-S4.  No rubs murmurs or bruit GI: Abdomen is soft.  Nontender nondistended.  Bowel sounds are present normal.  No masses  organomegaly Extremities: No edema.  Full range of motion of lower extremities. Neurologic: Alert and oriented x3.  No focal neurological deficits.    Lab Results:  Data Reviewed: I have personally reviewed following labs and imaging studies  CBC: Recent Labs  Lab 03/26/21 1412  WBC 4.0  NEUTROABS 2.8  HGB 14.4  HCT 43.2  MCV 88.7  PLT 174     Basic Metabolic Panel: Recent Labs  Lab 03/27/21 2010 03/28/21 0245 03/28/21 0857 03/29/21 0423 03/29/21 0821 03/29/21 1550 03/29/21 2352 03/30/21 0350  NA 118* 121*   < > 121* 121* 126* 124* 122*  K 4.5 4.2  --  4.5 4.2  --   --  4.0  CL 86* 88*  --  89* 90*  --   --  90*  CO2 19* 20*  --  18* 20*  --   --  21*  GLUCOSE 78 82  --  91 96  --   --  88  BUN 76* 81*  --  62* 61*  --   --  60*  CREATININE 4.26* 4.21*  --  3.58* 3.91*  --   --  3.73*  CALCIUM 8.7* 8.8*  --  8.5* 8.5*  --   --  8.3*   < > = values in this interval not displayed.     GFR: Estimated Creatinine Clearance: 22.2 mL/min (A) (by C-G formula based on SCr of 3.73 mg/dL (H)).  Liver Function Tests: Recent Labs  Lab 03/26/21 1412 03/29/21 0821  AST 37 43*  ALT 15 18  ALKPHOS 117 112  BILITOT 3.2* 3.1*  PROT 6.7 6.4*  ALBUMIN 3.2* 3.0*       Radiology Studies: No results found.     LOS: 3 days   Rhianna Raulerson [employer] on [email or web address]  03/30/2021, 11:23 AM

## 2021-03-30 NOTE — Progress Notes (Signed)
Patient ID: Benjamin [name], male   DOB: October 03, 1955, 65 y.o.   MRN: [id number] S: Na improved to 126 then dropped to 122. No new c/o   O:BP (!) 142/88 (BP Location: Left Arm)   Pulse 72   Temp 97.7 F (36.5 C) (Axillary)   Resp 19   Ht 6' (1.829 m)   Wt 90.9 kg   SpO2 98%   BMI 27.17 kg/m   Intake/Output Summary (Last 24 hours) at 03/30/2021 1147 Last data filed at 03/30/2021 0934 Gross per 24 hour  Intake 1247.16 ml  Output 900 ml  Net 347.16 ml    Intake/Output: I/O last 3 completed shifts: In: 2208.9 [P.O.:680; I.V.:1528.9] Out: 1350 [Urine:1350]  Intake/Output this shift:  Total I/O In: 240 [P.O.:240] Out: -  Weight change: 0.856 kg Physical exam:   alert, nad   no jvd  Chest cta bilat  Cor reg no RG  Abd soft ntnd no ascites   Ext 2+ ankle/ pretibial edema bilat   Alert, NF, ox3    Recent Labs  Lab 03/26/21 1412 03/26/21 2252 03/27/21 0349 03/27/21 1500 03/27/21 2010 03/28/21 0245 03/28/21 0857 03/28/21 1528 03/29/21 0423 03/29/21 0821 03/29/21 1550 03/29/21 2352 03/30/21 0350  NA 117*   < > 120* 119* 118* 121* 121* 122* 121* 121* 126* 124* 122*  K 4.5   < > 4.9 4.6 4.5 4.2  --   --  4.5 4.2  --   --  4.0  CL 86*   < > 88* 89* 86* 88*  --   --  89* 90*  --   --  90*  CO2 18*   < > 21* 19* 19* 20*  --   --  18* 20*  --   --  21*  GLUCOSE 52*   < > 69* 74 78 82  --   --  91 96  --   --  88  BUN 53*   < > 67* 80* 76* 81*  --   --  62* 61*  --   --  60*  CREATININE 4.20*   < > 4.40* 4.26* 4.26* 4.21*  --   --  3.58* 3.91*  --   --  3.73*  ALBUMIN 3.2*  --   --   --   --   --   --   --   --  3.0*  --   --   --   CALCIUM 8.8*   < > 8.9 8.8* 8.7* 8.8*  --   --  8.5* 8.5*  --   --  8.3*  AST 37  --   --   --   --   --   --   --   --  43*  --   --   --   ALT 15  --   --   --   --   --   --   --   --  18  --   --   --    < > = values in this interval not displayed.    Liver Function Tests:  Iron Studies: No results for input(s): IRON, TIBC,  TRANSFERRIN, FERRITIN in the last 72 hours. Studies/Results: No results found.  heparin  5,000 Units Subcutaneous Q8H   hydrocortisone cream   Topical BID   mometasone-formoterol  2 puff Inhalation BID   sodium bicarbonate  1,300 mg Oral TID   tolvaptan  30 mg Oral Once  Assessment/Plan:  Severe Hyponatremia, asymptomatic - urine studies consistent with hypovolemic hyponatremia (UNa 13, Uosm 224) initially, Na improved w/ 0.9% saline from 117 to 121 but wouldn't improve further. Pt now vol overloaded/ edematous so started tolvaptan yesterday and Na improved but declined again overnight. Will continue tolvaptan and ^ to 71m today. Gave 1 dose lasix IV earlier which shouldn't be an issue. Will follow.  CKD stage V - baseline creat high 3's to low 4's. Creat labs here are similar w/ eGFR around 15 ml/min. Son reported some nausea at home but the patient played that down. Pt notd some fatigue over last few mos. Not clearly uremic and eating well here. No indication for RRT.    HTN - stable Respiratory isolation - COVID negative Metabolic acidosis - NaHCO3 tabs and follow DM 2  RKelly Splinter MD 03/30/2021, 11:47 AM

## 2021-03-31 LAB — CBC
HCT: 36 % — ABNORMAL LOW (ref 39.0–52.0)
Hemoglobin: 11.9 g/dL — ABNORMAL LOW (ref 13.0–17.0)
MCH: 29.4 pg (ref 26.0–34.0)
MCHC: 33.1 g/dL (ref 30.0–36.0)
MCV: 88.9 fL (ref 80.0–100.0)
Platelets: 126 10*3/uL — ABNORMAL LOW (ref 150–400)
RBC: 4.05 MIL/uL — ABNORMAL LOW (ref 4.22–5.81)
RDW: 16.2 % — ABNORMAL HIGH (ref 11.5–15.5)
WBC: 4 10*3/uL (ref 4.0–10.5)
nRBC: 0 % (ref 0.0–0.2)

## 2021-03-31 LAB — BASIC METABOLIC PANEL
Anion gap: 11 (ref 5–15)
BUN: 57 mg/dL — ABNORMAL HIGH (ref 8–23)
CO2: 23 mmol/L (ref 22–32)
Calcium: 8.7 mg/dL — ABNORMAL LOW (ref 8.9–10.3)
Chloride: 93 mmol/L — ABNORMAL LOW (ref 98–111)
Creatinine, Ser: 4.01 mg/dL — ABNORMAL HIGH (ref 0.61–1.24)
GFR, Estimated: 16 mL/min — ABNORMAL LOW (ref >60)
Glucose, Bld: 91 mg/dL (ref 70–99)
Potassium: 4 mmol/L (ref 3.5–5.1)
Sodium: 127 mmol/L — ABNORMAL LOW (ref 135–145)

## 2021-03-31 LAB — GLUCOSE, CAPILLARY
Glucose-Capillary: 83 mg/dL (ref 70–99)
Glucose-Capillary: 95 mg/dL (ref 70–99)
Glucose-Capillary: 131 mg/dL — ABNORMAL HIGH (ref 70–99)
Glucose-Capillary: 115 mg/dL — ABNORMAL HIGH (ref 70–99)

## 2021-03-31 LAB — SODIUM: Sodium: 128 mmol/L — ABNORMAL LOW (ref 135–145)

## 2021-03-31 MED ORDER — FUROSEMIDE 10 MG/ML IJ SOLN
80.0000 mg | Freq: Three times a day (TID) | INTRAMUSCULAR | Status: DC
  Administered 2021-03-31 – 2021-04-01 (×3): 80 mg via INTRAVENOUS
  Filled 2021-03-31 (×3): qty 8

## 2021-03-31 MED ORDER — FUROSEMIDE 10 MG/ML IJ SOLN
80.0000 mg | Freq: Two times a day (BID) | INTRAMUSCULAR | Status: DC
  Administered 2021-03-31: 80 mg via INTRAVENOUS
  Filled 2021-03-31: qty 8

## 2021-03-31 NOTE — Plan of Care (Signed)

## 2021-03-31 NOTE — Progress Notes (Signed)
On call provider was contacted and made aware that patient has been refusing scheduled heparin injections.

## 2021-03-31 NOTE — Plan of Care (Signed)

## 2021-03-31 NOTE — Progress Notes (Signed)
Patient ID: Benjamin [name], male   DOB: Jun 01, 1956, 65 y.o.   MRN: [id number] S: Na 127 this am, pt w/o c/o's.    O:BP (!) 137/92 (BP Location: Right Arm)   Pulse 76   Temp 97.6 F (36.4 C) (Oral)   Resp 18   Ht 6' (1.829 m)   Wt 89 kg   SpO2 98%   BMI 26.61 kg/m   Intake/Output Summary (Last 24 hours) at 03/31/2021 0908 Last data filed at 03/31/2021 1749 Gross per 24 hour  Intake 960 ml  Output 2155 ml  Net -1195 ml    Intake/Output: I/O last 3 completed shifts: In: 1020 [P.O.:1020] Out: 2505 [Urine:2505]  Intake/Output this shift:  No intake/output data recorded. Weight change: -1.855 kg Physical exam:   alert, nad   no jvd  Chest cta bilat  Cor reg no RG  Abd soft ntnd no ascites   Ext 2+ ankle/ pretibial edema bilat   Alert, NF, ox3    Recent Labs  Lab 03/26/21 1412 03/26/21 2252 03/27/21 2010 03/28/21 0245 03/28/21 0857 03/29/21 0423 03/29/21 0821 03/29/21 1550 03/29/21 2352 03/30/21 0350 03/30/21 1228 03/30/21 1944 03/31/21 0331  NA 117*   < > 118* 121*   < > 121* 121* 126* 124* 122* 126* 122* 127*  K 4.5   < > 4.5 4.2  --  4.5 4.2  --   --  4.0 4.1  --  4.0  CL 86*   < > 86* 88*  --  89* 90*  --   --  90* 93*  --  93*  CO2 18*   < > 19* 20*  --  18* 20*  --   --  21* 22  --  23  GLUCOSE 52*   < > 78 82  --  91 96  --   --  88 103*  --  91  BUN 53*   < > 76* 81*  --  62* 61*  --   --  60* 59*  --  57*  CREATININE 4.20*   < > 4.26* 4.21*  --  3.58* 3.91*  --   --  3.73* 4.01*  --  4.01*  ALBUMIN 3.2*  --   --   --   --   --  3.0*  --   --   --  3.3*  --   --   CALCIUM 8.8*   < > 8.7* 8.8*  --  8.5* 8.5*  --   --  8.3* 8.8*  --  8.7*  AST 37  --   --   --   --   --  43*  --   --   --  45*  --   --   ALT 15  --   --   --   --   --  18  --   --   --  18  --   --    < > = values in this interval not displayed.    Liver Function Tests:  Iron Studies: No results for input(s): IRON, TIBC, TRANSFERRIN, FERRITIN in the last 72  hours. Studies/Results: No results found.  furosemide  80 mg Intravenous Q8H   heparin  5,000 Units Subcutaneous Q8H   hydrocortisone cream   Topical BID   mometasone-formoterol  2 puff Inhalation BID   sodium bicarbonate  1,300 mg Oral TID   Assessment/Plan:  Severe Hyponatremia, asymptomatic - urine studies consistent with hypovolemic  hyponatremia (UNa 13, Uosm 224) initially, Na improved w/ 0.9% saline from 117 to 121 but was stuck at 121. Pt now vol overloaded/ edematous so started tolvaptan 71m then 342mand Na up to 127. Still pitting edema, will stop tolvaptan and start IV lasix 80 tid. Get Na tonight and regular lab in am.  CKD stage V - baseline creat high 3's to low 4's. Creat labs here are similar w/ eGFR around 15 ml/min. Son reported some nausea at home but the patient plays it down. Pt noted some fatigue over last few mos. Not clearly uremic and eating well here. No indication for RRT.    HTN - stable Respiratory isolation - COVID negative Metabolic acidosis - NaHCO3 tabs and follow DM 2  RoKelly SplinterMD 03/31/2021, 9:08 AM

## 2021-03-31 NOTE — Progress Notes (Signed)
[facility] PROGRESS NOTE   Benjamin [name] WRU:[id number] DOB: Oct 14, 1955 DOA: 03/26/2021  PCP: Mateo Flow, MD  Brief History/Interval Summary: 65 y.o. 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  Patient underwent routine blood work in the outpatient setting where he was found to have a sodium level of 119.  He was sent over to the emergency department.  Subsequently hospitalized.   Reason for Visit: Hyponatremia  Consultants: Nephrology  Procedures: None yet  Antibiotics: Anti-infectives (From admission, onward)    None       Subjective/Interval History: Patient mentions that he is feeling slightly better.  He urinated a lot yesterday after he was given furosemide.  Denies any headaches, nausea, vomiting.  No chest pain or shortness of breath.    Assessment/Plan:  Hyponatremia Noted to have a low sodium level of 117 initially.  Nephrology was consulted.  There was some improvement after patient was given IV fluids.  Due to concern for hypovolemia patient was initially placed on normal saline infusion.  There was some improvement in sodium levels initially but then started decreasing again.  Patient was given tolvaptan without much improvement.  Subsequently patient noted to be volume overloaded.  Given Lasix yesterday.  Sodium level appears to be better this morning.  Nephrology has started 3 times daily intravenous furosemide for today.  Recheck labs tomorrow. Serum osmolality 270.  Cortisol 20.8.  TSH 3.2.  Urine osmolality 247.  Urine sodium 13 and urine creatinine 93.7.  Chronic kidney disease stage V Renal function seems to be close to baseline.  Followed by nephrology in the outpatient setting.  Monitor urine output.  Bicarbonate level noted to be stable.  Continue oral sodium bicarbonate   Essential hypertension Not noted to be on any antihypertensives prior to admission.  Occasional high  readings noted.  Continue to monitor.    Diabetes mellitus type 2, diet controlled/hypoglycemia Noted to have low glucose levels on 7/22.  Likely due to poor oral intake.  Subsequently improved.      DVT Prophylaxis: Subcutaneous heparin Code Status: Full code Family Communication: Discussed with the patient.  No family at bedside Disposition Plan: Hopefully return home when improved.  Continue to mobilize.  Status is: Inpatient  Remains inpatient appropriate because:IV treatments appropriate due to intensity of illness or inability to take PO and Inpatient level of care appropriate due to severity of illness  Dispo:  Patient From: Home  Planned Disposition: Home  Medically stable for discharge: No           Medications: Scheduled:  furosemide  80 mg Intravenous Q8H   heparin  5,000 Units Subcutaneous Q8H   hydrocortisone cream   Topical BID   mometasone-formoterol  2 puff Inhalation BID   sodium bicarbonate  1,300 mg Oral TID   Continuous:  [id number]:[id number] **OR** acetaminophen, albuterol, ipratropium, ondansetron **OR** ondansetron (ZOFRAN) IV, polyvinyl alcohol   Objective:  Vital Signs  Vitals:   03/30/21 1242 03/30/21 2146 03/31/21 0500 03/31/21 0614  BP: (!) 135/94 138/85  (!) 137/92  Pulse: 77 78  76  Resp: 14 18  18   Temp: (!) 97.4 F (36.3 C) (!) 97.4 F (36.3 C)  97.6 F (36.4 C)  TempSrc: Oral Axillary  Oral  SpO2: 98% 97%  98%  Weight:   89 kg   Height:        Intake/Output Summary (Last 24 hours) at 03/31/2021 1044 Last data filed at  03/31/2021 1000 Gross per 24 hour  Intake 1080 ml  Output 2155 ml  Net -1075 ml    Filed Weights   03/29/21 0500 03/30/21 0459 03/31/21 0500  Weight: 90 kg 90.9 kg 89 kg    General appearance: Awake alert.  In no distress Resp: Clear to auscultation bilaterally.  Normal effort Cardio: S1-S2 is normal regular.  No S3-S4.  No rubs murmurs or bruit GI: Abdomen is soft.  Nontender nondistended.  Bowel  sounds are present normal.  No masses organomegaly Extremities: Bilateral pedal edema.  Neurologic: Alert and oriented x3.  No focal neurological deficits.    Lab Results:  Data Reviewed: I have personally reviewed following labs and imaging studies  CBC: Recent Labs  Lab 03/26/21 1412 03/31/21 0331  WBC 4.0 4.0  NEUTROABS 2.8  --   HGB 14.4 11.9*  HCT 43.2 36.0*  MCV 88.7 88.9  PLT 174 126*     Basic Metabolic Panel: Recent Labs  Lab 03/29/21 0423 03/29/21 0821 03/29/21 1550 03/29/21 2352 03/30/21 0350 03/30/21 1228 03/30/21 1944 03/31/21 0331  NA 121* 121*   < > 124* 122* 126* 122* 127*  K 4.5 4.2  --   --  4.0 4.1  --  4.0  CL 89* 90*  --   --  90* 93*  --  93*  CO2 18* 20*  --   --  21* 22  --  23  GLUCOSE 91 96  --   --  88 103*  --  91  BUN 62* 61*  --   --  60* 59*  --  57*  CREATININE 3.58* 3.91*  --   --  3.73* 4.01*  --  4.01*  CALCIUM 8.5* 8.5*  --   --  8.3* 8.8*  --  8.7*   < > = values in this interval not displayed.     GFR: Estimated Creatinine Clearance: 20.7 mL/min (A) (by C-G formula based on SCr of 4.01 mg/dL (H)).  Liver Function Tests: Recent Labs  Lab 03/26/21 1412 03/29/21 0821 03/30/21 1228  AST 37 43* 45*  ALT 15 18 18   ALKPHOS 117 112 123  BILITOT 3.2* 3.1* 3.3*  PROT 6.7 6.4* 7.1  ALBUMIN 3.2* 3.0* 3.3*       Radiology Studies: No results found.     LOS: 4 days   [place] [employer] on [email or web address]  03/31/2021, 10:44 AM

## 2021-04-01 LAB — CBC WITH DIFFERENTIAL/PLATELET
Abs Immature Granulocytes: 0.01 10*3/uL (ref 0.00–0.07)
Basophils Absolute: 0 10*3/uL (ref 0.0–0.1)
Basophils Relative: 1 %
Eosinophils Absolute: 0.1 10*3/uL (ref 0.0–0.5)
Eosinophils Relative: 3 %
HCT: 36.9 % — ABNORMAL LOW (ref 39.0–52.0)
Hemoglobin: 12.1 g/dL — ABNORMAL LOW (ref 13.0–17.0)
Immature Granulocytes: 0 %
Lymphocytes Relative: 16 %
Lymphs Abs: 0.6 10*3/uL — ABNORMAL LOW (ref 0.7–4.0)
MCH: 29.2 pg (ref 26.0–34.0)
MCHC: 32.8 g/dL (ref 30.0–36.0)
MCV: 89.1 fL (ref 80.0–100.0)
Monocytes Absolute: 0.4 10*3/uL (ref 0.1–1.0)
Monocytes Relative: 9 %
Neutro Abs: 2.9 10*3/uL (ref 1.7–7.7)
Neutrophils Relative %: 71 %
Platelets: 143 10*3/uL — ABNORMAL LOW (ref 150–400)
RBC: 4.14 MIL/uL — ABNORMAL LOW (ref 4.22–5.81)
RDW: 16.5 % — ABNORMAL HIGH (ref 11.5–15.5)
WBC: 4 10*3/uL (ref 4.0–10.5)
nRBC: 0 % (ref 0.0–0.2)

## 2021-04-01 LAB — BASIC METABOLIC PANEL
Anion gap: 13 (ref 5–15)
BUN: 60 mg/dL — ABNORMAL HIGH (ref 8–23)
CO2: 24 mmol/L (ref 22–32)
Calcium: 9.1 mg/dL (ref 8.9–10.3)
Chloride: 93 mmol/L — ABNORMAL LOW (ref 98–111)
Creatinine, Ser: 3.6 mg/dL — ABNORMAL HIGH (ref 0.61–1.24)
GFR, Estimated: 18 mL/min — ABNORMAL LOW (ref >60)
Glucose, Bld: 100 mg/dL — ABNORMAL HIGH (ref 70–99)
Potassium: 4 mmol/L (ref 3.5–5.1)
Sodium: 130 mmol/L — ABNORMAL LOW (ref 135–145)

## 2021-04-01 MED ORDER — SODIUM BICARBONATE 650 MG PO TABS: 1300.0000 mg | ORAL_TABLET | Freq: Three times a day (TID) | ORAL | 0 refills | Status: DC

## 2021-04-01 MED ORDER — FUROSEMIDE 40 MG PO TABS: 80.0000 mg | ORAL_TABLET | Freq: Two times a day (BID) | ORAL | Status: DC

## 2021-04-01 MED ORDER — ONDANSETRON HCL 4 MG PO TABS: 4.0000 mg | ORAL_TABLET | Freq: Four times a day (QID) | ORAL | 0 refills | Status: DC | PRN

## 2021-04-01 MED ORDER — FUROSEMIDE 40 MG PO TABS: 80.0000 mg | ORAL_TABLET | Freq: Every day | ORAL | Status: DC

## 2021-04-01 MED ORDER — HYDROCORTISONE 0.5 % EX CREA: TOPICAL_CREAM | Freq: Two times a day (BID) | CUTANEOUS | 0 refills | Status: DC

## 2021-04-01 MED ORDER — FUROSEMIDE 80 MG PO TABS: ORAL_TABLET | ORAL | 0 refills | Status: DC

## 2021-04-01 MED ORDER — ACETAMINOPHEN 325 MG PO TABS: 650.0000 mg | ORAL_TABLET | Freq: Four times a day (QID) | ORAL | 0 refills | Status: DC | PRN

## 2021-04-01 NOTE — Evaluation (Addendum)
Occupational Therapy Evaluation Patient Details Name: Benjamin [name] MRN: [id number] DOB: 10/08/1955 Today's Date: 04/01/2021    History of Present Illness Benjamin [name] is a 65 y.o. 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 Pt presents to ED for hyponatremia.   Clinical Impression   Mr. 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 On evaluation patient able to perform ADLs with min guard during standing. Patient had two loss of balances without device - one he corrected and one the therapist corrected. Patient finished ambulation in room with RW without LOB. Overall patient min guard for all tasks secondary to impaired balance. Patient reports lower extremity weakness from edema and prolonged bed rest in hospital. Therapist recommended use of RW at home and a shower chair with bathing for safety. Patient and spouse verbalized understanding. No further OT needs.    Follow Up Recommendations  No OT follow up;Supervision/Assistance - 24 hour (HH PT)    Equipment Recommendations  Other (comment) (RW)    Recommendations for Other Services PT consult     Precautions / Restrictions Precautions Precautions: Fall Precaution Comments: fell in hospital Restrictions Weight Bearing Restrictions: No      Mobility Bed Mobility               General bed mobility comments: Up in chair    Transfers Overall transfer level: Needs assistance Equipment used: Rolling walker (2 wheeled) Transfers: Sit to/from Stand Sit to Stand: Min guard         General transfer comment: Predominantly min guard. had two loss of balances without walker - he corrected one with holding onto furniture and the second therapist corrected. Complete ambulation with RW and min guard.    Balance Overall balance assessment: Needs assistance Sitting-balance support: No  upper extremity supported Sitting balance-Leahy Scale: Good     Standing balance support: Single extremity supported Standing balance-Leahy Scale: Fair                             ADL either performed or assessed with clinical judgement   ADL Overall ADL's : Needs assistance/impaired Eating/Feeding: Independent   Grooming: Min guard;Standing   Upper Body Bathing: Sitting;Independent   Lower Body Bathing: Min guard;Sit to/from stand   Upper Body Dressing : Independent;Sitting   Lower Body Dressing: Min guard;Sit to/from stand   Toilet Transfer: Min guard;Regular Toilet;Grab bars;RW   Toileting- Water quality scientist and Hygiene: Min guard;Sit to/from stand       Functional mobility during ADLs: Min guard;Rolling walker       Vision Patient Visual Report: No change from baseline       Perception     Praxis      Pertinent Vitals/Pain Pain Assessment: No/denies pain     Hand Dominance     Extremity/Trunk Assessment Upper Extremity Assessment Upper Extremity Assessment: Overall WFL for tasks assessed (left shoulder 4-/5 strength from an "old football injury")   Lower Extremity Assessment Lower Extremity Assessment: Defer to PT evaluation   Cervical / Trunk Assessment Cervical / Trunk Assessment: Normal   Communication Communication Communication: No difficulties   Cognition Arousal/Alertness: Awake/alert Behavior During Therapy: WFL for tasks assessed/performed Overall Cognitive Status: Within Functional Limits for tasks assessed  General Comments       Exercises     Shoulder Instructions      Home Living Family/patient expects to be discharged to:: Private residence Living Arrangements: Spouse/significant other Available Help at Discharge: Family Type of Home: House Home Access: Level entry     Home Layout: One level     Bathroom Shower/Tub: Occupational psychologist:  [place] - [employer]          Prior Functioning/Environment Level of Independence: Independent        Comments: works as a Engineer, mining Problem List: Impaired balance (sitting and/or standing);Decreased activity tolerance      OT Treatment/Interventions:      OT Goals(Current goals can be found in the care plan section) Acute Rehab OT Goals Patient Stated Goal: to go home OT Goal Formulation: With patient Time For Goal Achievement: 04/15/21 Potential to Achieve Goals: Good  OT Frequency:     Barriers to D/C:            Co-evaluation              AM-PAC OT "6 Clicks" Daily Activity     Outcome Measure Help from another person eating meals?: None Help from another person taking care of personal grooming?: A Little Help from another person toileting, which includes using toliet, bedpan, or urinal?: A Little Help from another person bathing (including washing, rinsing, drying)?: A Little Help from another person to put on and taking off regular upper body clothing?: A Little Help from another person to put on and taking off regular lower body clothing?: A Little 6 Click Score: 19   End of Session Equipment Utilized During Treatment: Rolling walker Nurse Communication: Mobility status  Activity Tolerance: Patient tolerated treatment well Patient left: in chair;with call bell/phone within reach;with family/visitor present  OT Visit Diagnosis: Muscle weakness (generalized) (M62.81)                Time: 1610-9604 OT Time Calculation (min): 8 min Charges:  OT General Charges $OT Visit: 1 Visit OT Evaluation $OT Eval Low Complexity: 1 Low  Breindy Meadow, OTR/L [place]  Office [phone] Pager: [phone]   Lenward Chancellor 04/01/2021, 12:32 PM

## 2021-04-01 NOTE — TOC Progression Note (Signed)
Transition of Care [facility]) - Progression Note    Patient Details  Name: Benjamin [name] MRN: [id number] Date of Birth: 12-08-1955  Transition of Care [facility]) CM/SW Contact  Benjamin Mouton, Benjamin [name] Phone Number: 04/01/2021, 12:26 PM  Clinical Narrative:    Spoke with pt's wife at bedside concerning Benjamin [name]. In house rep with Benjamin [name] was given referral. RW was ordered from [facility].    Expected Discharge Plan: [place] Barriers to Discharge: No Barriers Identified  Expected Discharge Plan and Services Expected Discharge Plan: [place]   Discharge Planning Services: CM Consult Post Acute Care Choice: Durable Medical Equipment, Home Health Living arrangements for the past 2 months: Single Family Home                           HH Arranged: PT, OT HH Agency: [facility] Date [place]: 04/01/21 Time [place]: 1224 Representative spoke with at [place]: Benjamin ([place]) Interventions    Readmission Risk Interventions No flowsheet data found.

## 2021-04-01 NOTE — Progress Notes (Signed)
Pt discharge to home, instructions reviewed with pt and spouse, acknowledged understanding.

## 2021-04-01 NOTE — Progress Notes (Signed)
Patient ID: Benjamin [name], male   DOB: 02/03/1956, 65 y.o.   MRN: [id number] S: Na 130 this am, no new c/o's, creat down 3.6 today   O:BP (!) 142/93 (BP Location: Left Arm)   Pulse 74   Temp 98.1 F (36.7 C) (Oral)   Resp 14   Ht 6' (1.829 m)   Wt 91 kg   SpO2 98%   BMI 27.21 kg/m   Intake/Output Summary (Last 24 hours) at 04/01/2021 1006 Last data filed at 04/01/2021 0549 Gross per 24 hour  Intake 690 ml  Output 1850 ml  Net -1160 ml    Intake/Output: I/O last 3 completed shifts: In: 1410 [P.O.:1410] Out: 2875 [Urine:2875]  Intake/Output this shift:  No intake/output data recorded. Weight change: 2 kg Physical exam:   alert, nad   no jvd  Chest cta bilat  Cor reg no RG  Abd soft ntnd no ascites   Ext 1-2+ ankle/ pretibial edema bilat   Alert, NF, ox3    Recent Labs  Lab 03/26/21 1412 03/26/21 2252 03/28/21 0245 03/28/21 0857 03/29/21 0423 03/29/21 0821 03/29/21 1550 03/29/21 2352 03/30/21 0350 03/30/21 1228 03/30/21 1944 03/31/21 0331 03/31/21 1911 04/01/21 0412  NA 117*   < > 121*   < > 121* 121*   < > 124* 122* 126* 122* 127* 128* 130*  K 4.5   < > 4.2  --  4.5 4.2  --   --  4.0 4.1  --  4.0  --  4.0  CL 86*   < > 88*  --  89* 90*  --   --  90* 93*  --  93*  --  93*  CO2 18*   < > 20*  --  18* 20*  --   --  21* 22  --  23  --  24  GLUCOSE 52*   < > 82  --  91 96  --   --  88 103*  --  91  --  100*  BUN 53*   < > 81*  --  62* 61*  --   --  60* 59*  --  57*  --  60*  CREATININE 4.20*   < > 4.21*  --  3.58* 3.91*  --   --  3.73* 4.01*  --  4.01*  --  3.60*  ALBUMIN 3.2*  --   --   --   --  3.0*  --   --   --  3.3*  --   --   --   --   CALCIUM 8.8*   < > 8.8*  --  8.5* 8.5*  --   --  8.3* 8.8*  --  8.7*  --  9.1  AST 37  --   --   --   --  43*  --   --   --  45*  --   --   --   --   ALT 15  --   --   --   --  18  --   --   --  18  --   --   --   --    < > = values in this interval not displayed.    Liver Function Tests:  Iron Studies: No results  for input(s): IRON, TIBC, TRANSFERRIN, FERRITIN in the last 72 hours. Studies/Results: No results found.  furosemide  80 mg Intravenous Q8H   heparin  5,000 Units Subcutaneous  Q8H   hydrocortisone cream   Topical BID   mometasone-formoterol  2 puff Inhalation BID   sodium bicarbonate  1,300 mg Oral TID   Assessment/Plan:  Severe Hyponatremia, asymptomatic - urine studies consistent with hypovolemic hyponatremia (UNa 13, Uosm 224) initially, Na improved w/ 0.9% saline from 117 to 121 but was stuck at 121. Pt now vol overloaded/ edematous so started tolvaptan 42m then 325mand Na up to 127, then got one day IV lasix 80 tid w/ Na up to 130. Still pretib edema today. OK for dc home > plan is for 3 days of po lasix 80 bid then resume 8065mo daily.  Do not drink extra water/ fluids to "flush the kidneys", we had a long talk about this. We will contact pt for office f/u visit w/ Dr FosRoyce Macadamiaill sign off.  CKD stage V - baseline creat high 3's to low 4's. Creat labs here are similar w/ eGFR around 15 ml/min. Son reported some nausea at home but the patient plays it down. Pt noted some fatigue over last few mos. Not clearly uremic and eating well here.  HTN - stable Respiratory isolation - COVID negative Metabolic acidosis - NaHCO3 tabs continue at dc DM 2  RobKelly SplinterD 04/01/2021, 10:06 AM

## 2021-04-01 NOTE — Progress Notes (Signed)
Patient continues to refuse heparin subcutaneous injection. Patient also refused hydrocortisone cream, as well as SCD's.

## 2021-04-01 NOTE — Plan of Care (Signed)
Problem: Health Behavior/Discharge Planning: Goal: Ability to manage health-related needs will improve 04/01/2021 1324 by Zadie Rhine, RN Outcome: Adequate for Discharge 04/01/2021 1324 by Zadie Rhine, RN Outcome: Progressing   Problem: Clinical Measurements: Goal: Ability to maintain clinical measurements within normal limits will improve 04/01/2021 1324 by Zadie Rhine, RN Outcome: Adequate for Discharge 04/01/2021 1324 by Zadie Rhine, RN Outcome: Progressing Goal: Will remain free from infection 04/01/2021 1324 by Zadie Rhine, RN Outcome: Adequate for Discharge 04/01/2021 1324 by Zadie Rhine, RN Outcome: Progressing Goal: Diagnostic test results will improve Outcome: Adequate for Discharge Goal: Respiratory complications will improve Outcome: Adequate for Discharge Goal: Cardiovascular complication will be avoided Outcome: Adequate for Discharge   Problem: Activity: Goal: Risk for activity intolerance will decrease Outcome: Adequate for Discharge   Problem: Nutrition: Goal: Adequate nutrition will be maintained Outcome: Adequate for Discharge   Problem: Coping: Goal: Level of anxiety will decrease Outcome: Adequate for Discharge   Problem: Elimination: Goal: Will not experience complications related to bowel motility Outcome: Adequate for Discharge Goal: Will not experience complications related to urinary retention Outcome: Adequate for Discharge   Problem: Pain Managment: Goal: General experience of comfort will improve Outcome: Adequate for Discharge   Problem: Safety: Goal: Ability to remain free from injury will improve Outcome: Adequate for Discharge   Problem: Skin Integrity: Goal: Risk for impaired skin integrity will decrease Outcome: Adequate for Discharge   Problem: Health Behavior/Discharge Planning: Goal: Ability to manage health-related needs will improve 04/01/2021 1324 by Zadie Rhine, RN Outcome: Adequate  for Discharge 04/01/2021 1324 by Zadie Rhine, RN Outcome: Progressing   Problem: Clinical Measurements: Goal: Ability to maintain clinical measurements within normal limits will improve 04/01/2021 1324 by Zadie Rhine, RN Outcome: Adequate for Discharge 04/01/2021 1324 by Zadie Rhine, RN Outcome: Progressing Goal: Will remain free from infection 04/01/2021 1324 by Zadie Rhine, RN Outcome: Adequate for Discharge 04/01/2021 1324 by Zadie Rhine, RN Outcome: Progressing Goal: Diagnostic test results will improve Outcome: Adequate for Discharge Goal: Respiratory complications will improve Outcome: Adequate for Discharge Goal: Cardiovascular complication will be avoided Outcome: Adequate for Discharge   Problem: Activity: Goal: Risk for activity intolerance will decrease Outcome: Adequate for Discharge   Problem: Nutrition: Goal: Adequate nutrition will be maintained Outcome: Adequate for Discharge   Problem: Coping: Goal: Level of anxiety will decrease Outcome: Adequate for Discharge   Problem: Elimination: Goal: Will not experience complications related to bowel motility Outcome: Adequate for Discharge Goal: Will not experience complications related to urinary retention Outcome: Adequate for Discharge   Problem: Pain Managment: Goal: General experience of comfort will improve Outcome: Adequate for Discharge   Problem: Safety: Goal: Ability to remain free from injury will improve Outcome: Adequate for Discharge   Problem: Skin Integrity: Goal: Risk for impaired skin integrity will decrease Outcome: Adequate for Discharge   Problem: Health Behavior/Discharge Planning: Goal: Ability to manage health-related needs will improve 04/01/2021 1324 by Zadie Rhine, RN Outcome: Adequate for Discharge 04/01/2021 1324 by Zadie Rhine, RN Outcome: Progressing   Problem: Clinical Measurements: Goal: Ability to maintain clinical measurements  within normal limits will improve 04/01/2021 1324 by Zadie Rhine, RN Outcome: Adequate for Discharge 04/01/2021 1324 by Zadie Rhine, RN Outcome: Progressing Goal: Will remain free from infection 04/01/2021 1324 by Zadie Rhine, RN Outcome: Adequate for Discharge 04/01/2021 1324 by Zadie Rhine, RN Outcome: Progressing Goal: Diagnostic test results will improve Outcome: Adequate  for Discharge Goal: Respiratory complications will improve Outcome: Adequate for Discharge Goal: Cardiovascular complication will be avoided Outcome: Adequate for Discharge   Problem: Activity: Goal: Risk for activity intolerance will decrease Outcome: Adequate for Discharge   Problem: Nutrition: Goal: Adequate nutrition will be maintained Outcome: Adequate for Discharge   Problem: Coping: Goal: Level of anxiety will decrease Outcome: Adequate for Discharge   Problem: Elimination: Goal: Will not experience complications related to bowel motility Outcome: Adequate for Discharge Goal: Will not experience complications related to urinary retention Outcome: Adequate for Discharge   Problem: Pain Managment: Goal: General experience of comfort will improve Outcome: Adequate for Discharge   Problem: Safety: Goal: Ability to remain free from injury will improve Outcome: Adequate for Discharge   Problem: Skin Integrity: Goal: Risk for impaired skin integrity will decrease Outcome: Adequate for Discharge

## 2021-04-01 NOTE — Plan of Care (Signed)
  Problem: Health Behavior/Discharge Planning: Goal: Ability to manage health-related needs will improve Outcome: Progressing   Problem: Clinical Measurements: Goal: Ability to maintain clinical measurements within normal limits will improve Outcome: Progressing Goal: Will remain free from infection Outcome: Progressing   

## 2021-04-01 NOTE — Evaluation (Signed)
Physical Therapy Evaluation Patient Details Name: Benjamin [name] MRN: [id number] DOB: 1955/12/15 Today's Date: 04/01/2021   History of Present Illness  Benjamin [name] is a 65 y.o. 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 Pt presents to ED for hyponatremia.    Clinical Impression  Benjamin [name] is 65 y.o. male admitted with above HPI and diagnosis. Patient is currently limited by functional impairments below (see PT problem list). Patient lives with his wife and is independent at baseline. He reports he has been weak since hospitalization ~1 year ago and pt is now unsteady with gait. Balance is improved with bil UE support on RW and pt required min guard with no overt LOB noted. Educated pt on benefits of use os RW initailly and benefits of OPPT to address weakness and balance impairments. Patient will benefit from continued skilled PT interventions to address impairments and progress independence with mobility, recommending OPPT follow up for balance training and LE strengthening. Acute PT will follow and progress as able.     Follow Up Recommendations Outpatient PT    Equipment Recommendations  Rolling walker with 5" wheels    Recommendations for Other Services       Precautions / Restrictions Precautions Precautions: Fall Precaution Comments: fell in hospital Restrictions Weight Bearing Restrictions: No      Mobility  Bed Mobility               General bed mobility comments: OOB in recliner    Transfers Overall transfer level: Needs assistance Equipment used: None;Rolling walker (2 wheeled) Transfers: Sit to/from Stand Sit to Stand: Min guard;Supervision         General transfer comment: guard/supervision for rise from recliner. pt using bil UE's for power up.  Ambulation/Gait Ambulation/Gait assistance: Min guard;Min assist Gait Distance (Feet): 200 Feet Assistive device: Rolling  walker (2 wheeled);None Gait Pattern/deviations: Step-through pattern;Decreased stride length;Decreased dorsiflexion - right Gait velocity: decr   General Gait Details: cues for safe proximity/management of RW, adjsuted walker height and educated pt on how to adust. pt ambulate ~200' mostly with RW and min guard assist with no LOB noted. Min assist needed to steady without RW. Pt with decreased dorsiflexion bil R>L and quick extension during transition to stance phase of gait due to quad weakness on bil LE's.  Stairs            Wheelchair Mobility    Modified Rankin (Stroke Patients Only)       Balance Overall balance assessment: Needs assistance Sitting-balance support: No upper extremity supported Sitting balance-Leahy Scale: Good     Standing balance support: Single extremity supported Standing balance-Leahy Scale: Fair                               Pertinent Vitals/Pain Pain Assessment: No/denies pain    Home Living Family/patient expects to be discharged to:: Private residence Living Arrangements: Spouse/significant other Available Help at Discharge: Family Type of Home: House Home Access: Level entry     Home Layout: One level Home Equipment: Kasandra Knudsen - single point      Prior Function Level of Independence: Independent         Comments: works as a Counsellor        Extremity/Trunk Assessment   Upper Extremity Assessment Upper Extremity Assessment: Defer to OT evaluation    Lower  Extremity Assessment Lower Extremity Assessment: Generalized weakness    Cervical / Trunk Assessment Cervical / Trunk Assessment: Normal  Communication   Communication: No difficulties  Cognition Arousal/Alertness: Awake/alert Behavior During Therapy: WFL for tasks assessed/performed Overall Cognitive Status: Within Functional Limits for tasks assessed                                        General Comments       Exercises     Assessment/Plan    PT Assessment Patient needs continued PT services  PT Problem List Decreased strength;Decreased balance;Decreased mobility;Decreased activity tolerance;Decreased knowledge of use of DME;Decreased safety awareness       PT Treatment Interventions DME instruction;Gait training;Stair training;Functional mobility training;Therapeutic activities;Therapeutic exercise;Balance training;Neuromuscular re-education;Patient/family education    PT Goals (Current goals can be found in the Care Plan section)  Acute Rehab PT Goals Patient Stated Goal: to go home ASAP PT Goal Formulation: With patient/family Time For Goal Achievement: 04/08/21 Potential to Achieve Goals: Good    Frequency Min 3X/week   Barriers to discharge        Co-evaluation               AM-PAC PT "6 Clicks" Mobility  Outcome Measure Help needed turning from your back to your side while in a flat bed without using bedrails?: A Little Help needed moving from lying on your back to sitting on the side of a flat bed without using bedrails?: A Little Help needed moving to and from a bed to a chair (including a wheelchair)?: A Little Help needed standing up from a chair using your arms (e.g., wheelchair or bedside chair)?: A Little Help needed to walk in hospital room?: A Little Help needed climbing 3-5 steps with a railing? : A Little 6 Click Score: 18    End of Session Equipment Utilized During Treatment: Gait belt Activity Tolerance: Patient tolerated treatment well Patient left: in chair;with family/visitor present Nurse Communication: Mobility status PT Visit Diagnosis: Unsteadiness on feet (R26.81);Muscle weakness (generalized) (M62.81);Difficulty in walking, not elsewhere classified (R26.2)    Time: 5974-1638 PT Time Calculation (min) (ACUTE ONLY): 17 min   Charges:   PT Evaluation $PT Eval Low Complexity: 1 Low          Verner Mould, DPT Acute Rehabilitation  Services Office [phone] Pager [phone]   Jacques Navy 04/01/2021, 1:27 PM

## 2021-04-01 NOTE — Discharge Summary (Signed)
Physician Discharge Summary  Benjamin [name] OEU:[id number] DOB: 08-07-1956 DOA: 03/26/2021  PCP: Mateo Flow, MD  Admit date: 03/26/2021 Discharge date: 04/01/2021  Admitted From: Home Disposition: Home with Outpatient PT  Recommendations for Outpatient Follow-up:  Follow up with PCP in 1-2 weeks  Follow up with Nephrology within 1-2 weeks Please obtain CMP/CBC, Mag, Phos in one week Please follow up on the following pending results:  Home Health: Yes with outpatient PT Equipment/Devices: Rolling Walker with 5" Wheels  Discharge Condition: Stable  CODE STATUS: FULL CODE  Diet recommendation: Renal Diet with 1200 mL Fluid Restriction   Brief/Interim Summary: The patient is 65 y.o. 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  Patient underwent routine blood work in the outpatient setting where he was found to have a sodium level of 119.  He was sent over to the emergency department.  Subsequently hospitalized for severe hyponatremia.  Urine studies were consistent with hypovolemic hyponatremia and sodium improved from 1 17-1 21 but then he was given tolvaptan and trended up to 127.  He was received 1 dose of IV Lasix 80 mg 3 times daily and sodium went up to 130.  Still had some edema but nephrology cleared the patient for discharge and recommended 80 mg of p.o. Lasix twice daily for 3 days and then just 80 mg p.o. daily with no actual water.  Patient is baseline creatinine was in the threes to low fours and nephrology recommended continuing to monitor in outpatient setting.    Discharge Diagnoses:  Principal Problem:   Hyponatremia Active Problems:   CKD (chronic kidney disease) stage 5, GFR 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 (HCC)  Hyponatremia -Noted to have a low sodium level of 117 initially.  Nephrology was consulted.  There was some improvement after patient was given IV fluids.  Due to concern for hypovolemia patient was  initially placed on normal saline infusion. -There was some improvement in sodium levels initially but then started decreasing again.  Patient was given tolvaptan without much improvement.  Subsequently patient noted to be volume overloaded.  Given Lasix yesterday.  Sodium level appears to be better this morning.  Nephrology has started 3 times daily intravenous furosemide for today.  And sodium improved to 130 Serum osmolality 270.  Cortisol 20.8.  TSH 3.2.  Urine osmolality 247.  Urine sodium 13 and urine creatinine 93.7. -Sodium is up to 130  now and nephrology recommending discharging home on 80 mg of Lasix twice daily for 3 days and then just 80 mg daily-patient will need to follow-up with his primary nephrologist Dr. Royce Macadamia within a week for recheck -PT OT recommending home health   Chronic kidney disease stage V -Renal function seems to be close to baseline.  Followed by nephrology in the outpatient setting.  Monitor urine output.  Bicarbonate level noted to be stable and will continue oral sodium bicarbonate discharge -Patient's BUNs/creatinine is now stable at 60/3.60 at discharge and will need follow-up within 1 to 2 weeks   Essential hypertension -Not noted to be on any antihypertensives prior to admission.  Occasional high readings noted.  Continue to monitor.   -He is going to to get Lasix daily -Last blood pressure prior to discharge was 142/93  Anemia of chronic kidney disease -Stable -Patient's hemoglobin/hematocrit at discharge was 12.1/36.9 -Continue monitor for signs and symptoms bleeding and repeat CBC in the outpatient setting  Diabetes mellitus type 2, diet controlled/hypoglycemia -Noted to  have low glucose levels on 7/22.  Likely due to poor oral intake.  Subsequently improved.      Thrombocytopenia -Improving next-patient's platelet count went from 126 and is 143 -He had a fall in the hospital earlier on and bruises left side.  Discharge Instructions  Discharge  Instructions     Call MD for:  difficulty breathing, headache or visual disturbances   Complete by: As directed    Call MD for:  extreme fatigue   Complete by: As directed    Call MD for:  hives   Complete by: As directed    Call MD for:  persistant dizziness or light-headedness   Complete by: As directed    Call MD for:  persistant nausea and vomiting   Complete by: As directed    Call MD for:  redness, tenderness, or signs of infection (pain, swelling, redness, odor or green/yellow discharge around incision site)   Complete by: As directed    Call MD for:  severe uncontrolled pain   Complete by: As directed    Call MD for:  temperature >100.4   Complete by: As directed    Diet - low sodium heart healthy   Complete by: As directed    Renal Diet with Fluid Restriction with 1200 mL   Discharge instructions   Complete by: As directed    You were cared for by a hospitalist during your hospital stay. If you have any questions about your discharge medications or the care you received while you were in the hospital after you are discharged, you can call the unit and ask to speak with the hospitalist on call if the hospitalist that took care of you is not available. Once you are discharged, your primary care physician will handle any further medical issues. Please note that NO REFILLS for any discharge medications will be authorized once you are discharged, as it is imperative that you return to your primary care physician (or establish a relationship with a primary care physician if you do not have one) for your aftercare needs so that they can reassess your need for medications and monitor your lab values.  Follow up with PCP and Nephrology within 1-2 weeks. Take all medications as prescribed. If symptoms change or worsen please return to the ED for evaluation   Increase activity slowly   Complete by: As directed       Allergies as of 04/01/2021       Reactions   Grass Pollen(k-o-r-t-swt  Vern) Other (See Comments)   Eyes run, shortness of breath, itching         Medication List     STOP taking these medications    Bepotastine Besilate 1.5 % Soln Commonly known as: Bepreve   levalbuterol 45 MCG/ACT inhaler Commonly known as: Xopenex HFA   sevelamer carbonate 800 MG tablet Commonly known as: RENVELA   Vitamin D (Ergocalciferol) 1.25 MG (50000 UNIT) Caps capsule Commonly known as: DRISDOL       TAKE these medications    acetaminophen 325 MG tablet Commonly known as: TYLENOL Take 2 tablets (650 mg total) by mouth every 6 (six) hours as needed for mild pain (or Fever >/= 101).   albuterol 108 (90 Base) MCG/ACT inhaler Commonly known as: VENTOLIN HFA Can inhale two puffs every four to six hours as needed for cough, wheeze, shortness of breath, or chest tightness. What changed:  how much to take when to take this reasons to take this additional instructions  budesonide-formoterol 160-4.5 MCG/ACT inhaler Commonly known as: Symbicort Inhale 2 puffs into the lungs 2 (two) times daily. Rinse, gargle and spit after use. What changed: when to take this   furosemide 80 MG tablet Commonly known as: LASIX Take 1 tablet (80 mg total) by mouth 2 (two) times daily for 3 days, THEN 1 tablet (80 mg total) daily. Start taking on: April 01, 2021 What changed:  medication strength See the new instructions.   hydrocortisone cream 0.5 % Apply topically 2 (two) times daily.   hydroxypropyl methylcellulose / hypromellose 2.5 % ophthalmic solution Commonly known as: ISOPTO TEARS / GONIOVISC Place 1 drop into both eyes daily as needed for dry eyes.   ipratropium 0.06 % nasal spray Commonly known as: ATROVENT INSTILL 2 SPRAY INTO BOTH NOSTRILS TWICEDAILY What changed: See the new instructions.   ondansetron 4 MG tablet Commonly known as: ZOFRAN Take 1 tablet (4 mg total) by mouth every 6 (six) hours as needed for nausea.   sodium bicarbonate 650 MG tablet Take  2 tablets (1,300 mg total) by mouth 3 (three) times daily. What changed:  how much to take when to take this               Durable Medical Equipment  (From admission, onward)           Start     Ordered   04/01/21 1217  For home use only DME Walker rolling  Once       Question Answer Comment  Walker: With [place] Wheels   Patient needs a walker to treat with the following condition Fear for personal safety      04/01/21 1217            Follow-up Information     [facility] Follow up.   Specialty: Home Health Services Contact information: [address] [place] [address] [phone]         [facility] Follow up.   Why: Oxygen is from this company also called [employer]. Call the above number if you have any problem. Contact information: [address] [place] [address] [phone]                Allergies  Allergen Reactions   Grass Pollen(K-O-R-T-Swt Vern) Other (See Comments)    Eyes run, shortness of breath, itching     Consultations: Nephrology  Procedures/Studies: No results found.   Subjective: Seen and examined at bedside and he was doing fairly well and wanted to go home.  Had a bruise on his left side that was mildly painful but states it is improving.  Sodium is improved and he feels well.  No other concerns or complaints this time.   Discharge Exam: Vitals:   04/01/21 0651 04/01/21 0831  BP: (!) 142/93   Pulse: 74   Resp: 14   Temp: 98.1 F (36.7 C)   SpO2: 97% 98%   Vitals:   03/31/21 2344 04/01/21 0500 04/01/21 0651 04/01/21 0831  BP: (!) 142/89  (!) 142/93   Pulse: 75  74   Resp: 16  14   Temp: (!) 97.5 F (36.4 C)  98.1 F (36.7 C)   TempSrc: Oral  Oral   SpO2: 98%  97% 98%  Weight:  91 kg    Height:        General: Pt is alert, awake, not in acute distress Cardiovascular: RRR, S1/S2 +, no rubs, no gallops Respiratory: CTA bilaterally, no  wheezing, no  rhonchi Abdominal: Soft, NT, ND, bowel sounds + Extremities: no edema, no cyanosis    The results of significant diagnostics from this hospitalization (including imaging, microbiology, ancillary and laboratory) are listed below for reference.     Microbiology: Recent Results (from the past 240 hour(s))  Resp Panel by RT-PCR (Flu A&B, Covid) Nasopharyngeal Swab     Status: None   Collection Time: 03/27/21  2:05 PM   Specimen: Nasopharyngeal Swab; Nasopharyngeal(NP) swabs in vial transport medium  Result Value Ref Range Status   SARS Coronavirus 2 by RT PCR NEGATIVE NEGATIVE Final    Comment: (NOTE) SARS-CoV-2 target nucleic acids are NOT DETECTED.  The SARS-CoV-2 RNA is generally detectable in upper respiratory specimens during the acute phase of infection. The lowest concentration of SARS-CoV-2 viral copies this assay can detect is 138 copies/mL. A negative result does not preclude SARS-Cov-2 infection and should not be used as the sole basis for treatment or other patient management decisions. A negative result may occur with  improper specimen collection/handling, submission of specimen other than nasopharyngeal swab, presence of viral mutation(s) within the areas targeted by this assay, and inadequate number of viral copies(<138 copies/mL). A negative result must be combined with clinical observations, patient history, and epidemiological information. The expected result is Negative.  Fact Sheet for Patients:  EntrepreneurPulse.com.au  Fact Sheet for Healthcare Providers:  IncredibleEmployment.be  This test is no t yet approved or cleared by the [place] FDA and  has been authorized for detection and/or diagnosis of SARS-CoV-2 by FDA under an Emergency Use Authorization (EUA). This EUA will remain  in effect (meaning this test can be used) for the duration of the COVID-19 declaration under Section 564(b)(1) of the Act,  21 U.S.C.section 360bbb-3(b)(1), unless the authorization is terminated  or revoked sooner.       Influenza A by PCR NEGATIVE NEGATIVE Final   Influenza B by PCR NEGATIVE NEGATIVE Final    Comment: (NOTE) The Xpert Xpress SARS-CoV-2/FLU/RSV plus assay is intended as an aid in the diagnosis of influenza from Nasopharyngeal swab specimens and should not be used as a sole basis for treatment. Nasal washings and aspirates are unacceptable for Xpert Xpress SARS-CoV-2/FLU/RSV testing.  Fact Sheet for Patients: EntrepreneurPulse.com.au  Fact Sheet for Healthcare Providers: IncredibleEmployment.be  This test is not yet approved or cleared by the [place] FDA and has been authorized for detection and/or diagnosis of SARS-CoV-2 by FDA under an Emergency Use Authorization (EUA). This EUA will remain in effect (meaning this test can be used) for the duration of the COVID-19 declaration under Section 564(b)(1) of the Act, 21 U.S.C. section 360bbb-3(b)(1), unless the authorization is terminated or revoked.  Performed at [facility], [place] [address], [place], [place] [address]     Labs: BNP (last 3 results) No results for input(s): BNP in the last 8760 hours. Basic Metabolic Panel: Recent Labs  Lab 03/29/21 0821 03/29/21 1550 03/30/21 0350 03/30/21 1228 03/30/21 1944 03/31/21 0331 03/31/21 1911 04/01/21 0412  NA 121*   < > 122* 126* 122* 127* 128* 130*  K 4.2  --  4.0 4.1  --  4.0  --  4.0  CL 90*  --  90* 93*  --  93*  --  93*  CO2 20*  --  21* 22  --  23  --  24  GLUCOSE 96  --  88 103*  --  91  --  100*  BUN 61*  --  60* 59*  --  57*  --  60*  CREATININE 3.91*  --  3.73* 4.01*  --  4.01*  --  3.60*  CALCIUM 8.5*  --  8.3* 8.8*  --  8.7*  --  9.1   < > = values in this interval not displayed.   Liver Function Tests: Recent Labs  Lab 03/26/21 1412 03/29/21 0821 03/30/21 1228  AST 37 43* 45*  ALT 15 18 18    ALKPHOS 117 112 123  BILITOT 3.2* 3.1* 3.3*  PROT 6.7 6.4* 7.1  ALBUMIN 3.2* 3.0* 3.3*   No results for input(s): LIPASE, AMYLASE in the last 168 hours. No results for input(s): AMMONIA in the last 168 hours. CBC: Recent Labs  Lab 03/26/21 1412 03/31/21 0331 04/01/21 0412  WBC 4.0 4.0 4.0  NEUTROABS 2.8  --  2.9  HGB 14.4 11.9* 12.1*  HCT 43.2 36.0* 36.9*  MCV 88.7 88.9 89.1  PLT 174 126* 143*   Cardiac Enzymes: No results for input(s): CKTOTAL, CKMB, CKMBINDEX, TROPONINI in the last 168 hours. BNP: Invalid input(s): POCBNP CBG: Recent Labs  Lab 03/30/21 1621 03/31/21 0616 03/31/21 0731 03/31/21 1103 03/31/21 1618  GLUCAP 112* 83 95 131* 115*   D-Dimer No results for input(s): DDIMER in the last 72 hours. Hgb A1c No results for input(s): HGBA1C in the last 72 hours. Lipid Profile No results for input(s): CHOL, HDL, LDLCALC, TRIG, CHOLHDL, LDLDIRECT in the last 72 hours. Thyroid function studies No results for input(s): TSH, T4TOTAL, T3FREE, THYROIDAB in the last 72 hours.  Invalid input(s): FREET3 Anemia work up No results for input(s): VITAMINB12, FOLATE, FERRITIN, TIBC, IRON, RETICCTPCT in the last 72 hours. Urinalysis    Component Value Date/Time   COLORURINE YELLOW 03/26/2021 2200   APPEARANCEUR HAZY (A) 03/26/2021 2200   LABSPEC 1.009 03/26/2021 2200   PHURINE 5.0 03/26/2021 2200   GLUCOSEU 50 (A) 03/26/2021 2200   HGBUR MODERATE (A) 03/26/2021 2200   BILIRUBINUR NEGATIVE 03/26/2021 2200   KETONESUR 5 (A) 03/26/2021 2200   PROTEINUR >=300 (A) 03/26/2021 2200   NITRITE NEGATIVE 03/26/2021 2200   LEUKOCYTESUR NEGATIVE 03/26/2021 2200   Sepsis Labs Invalid input(s): PROCALCITONIN,  WBC,  LACTICIDVEN Microbiology Recent Results (from the past 240 hour(s))  Resp Panel by RT-PCR (Flu A&B, Covid) Nasopharyngeal Swab     Status: None   Collection Time: 03/27/21  2:05 PM   Specimen: Nasopharyngeal Swab; Nasopharyngeal(NP) swabs in vial transport medium   Result Value Ref Range Status   SARS Coronavirus 2 by RT PCR NEGATIVE NEGATIVE Final    Comment: (NOTE) SARS-CoV-2 target nucleic acids are NOT DETECTED.  The SARS-CoV-2 RNA is generally detectable in upper respiratory specimens during the acute phase of infection. The lowest concentration of SARS-CoV-2 viral copies this assay can detect is 138 copies/mL. A negative result does not preclude SARS-Cov-2 infection and should not be used as the sole basis for treatment or other patient management decisions. A negative result may occur with  improper specimen collection/handling, submission of specimen other than nasopharyngeal swab, presence of viral mutation(s) within the areas targeted by this assay, and inadequate number of viral copies(<138 copies/mL). A negative result must be combined with clinical observations, patient history, and epidemiological information. The expected result is Negative.  Fact Sheet for Patients:  EntrepreneurPulse.com.au  Fact Sheet for Healthcare Providers:  IncredibleEmployment.be  This test is no t yet approved or cleared by the [place] FDA and  has been authorized for detection and/or diagnosis of SARS-CoV-2  by FDA under an Emergency Use Authorization (EUA). This EUA will remain  in effect (meaning this test can be used) for the duration of the COVID-19 declaration under Section 564(b)(1) of the Act, 21 U.S.C.section 360bbb-3(b)(1), unless the authorization is terminated  or revoked sooner.       Influenza A by PCR NEGATIVE NEGATIVE Final   Influenza B by PCR NEGATIVE NEGATIVE Final    Comment: (NOTE) The Xpert Xpress SARS-CoV-2/FLU/RSV plus assay is intended as an aid in the diagnosis of influenza from Nasopharyngeal swab specimens and should not be used as a sole basis for treatment. Nasal washings and aspirates are unacceptable for Xpert Xpress SARS-CoV-2/FLU/RSV testing.  Fact Sheet for  Patients: EntrepreneurPulse.com.au  Fact Sheet for Healthcare Providers: IncredibleEmployment.be  This test is not yet approved or cleared by the [place] FDA and has been authorized for detection and/or diagnosis of SARS-CoV-2 by FDA under an Emergency Use Authorization (EUA). This EUA will remain in effect (meaning this test can be used) for the duration of the COVID-19 declaration under Section 564(b)(1) of the Act, 21 U.S.C. section 360bbb-3(b)(1), unless the authorization is terminated or revoked.  Performed at [facility], [place] [address], [place], [place] [address]    Time coordinating discharge: 35 minutes  SIGNED:  Kerney Elbe, DO [facility] 04/01/2021, 12:35 PM Pager is on [place]  If 7PM-7AM, please contact night-coverage [email or web address]

## 2021-04-08 DIAGNOSIS — E871 Hypo-osmolality and hyponatremia: Secondary | ICD-10-CM | POA: Diagnosis not present

## 2021-04-08 DIAGNOSIS — R6889 Other general symptoms and signs: Secondary | ICD-10-CM | POA: Diagnosis not present

## 2021-04-08 DIAGNOSIS — I120 Hypertensive chronic kidney disease with stage 5 chronic kidney disease or end stage renal disease: Secondary | ICD-10-CM | POA: Diagnosis not present

## 2021-04-08 DIAGNOSIS — N185 Chronic kidney disease, stage 5: Secondary | ICD-10-CM | POA: Diagnosis not present

## 2021-04-09 DIAGNOSIS — N2581 Secondary hyperparathyroidism of renal origin: Secondary | ICD-10-CM | POA: Diagnosis not present

## 2021-04-09 DIAGNOSIS — D631 Anemia in chronic kidney disease: Secondary | ICD-10-CM | POA: Diagnosis not present

## 2021-04-09 DIAGNOSIS — I120 Hypertensive chronic kidney disease with stage 5 chronic kidney disease or end stage renal disease: Secondary | ICD-10-CM | POA: Diagnosis not present

## 2021-04-09 DIAGNOSIS — N185 Chronic kidney disease, stage 5: Secondary | ICD-10-CM | POA: Diagnosis not present

## 2021-04-17 DIAGNOSIS — G9341 Metabolic encephalopathy: Secondary | ICD-10-CM | POA: Diagnosis not present

## 2021-04-17 DIAGNOSIS — D631 Anemia in chronic kidney disease: Secondary | ICD-10-CM | POA: Diagnosis not present

## 2021-04-17 DIAGNOSIS — Z515 Encounter for palliative care: Secondary | ICD-10-CM | POA: Diagnosis not present

## 2021-04-17 DIAGNOSIS — I2781 Cor pulmonale (chronic): Secondary | ICD-10-CM | POA: Diagnosis not present

## 2021-04-17 DIAGNOSIS — R17 Unspecified jaundice: Secondary | ICD-10-CM | POA: Diagnosis not present

## 2021-04-17 DIAGNOSIS — I2729 Other secondary pulmonary hypertension: Secondary | ICD-10-CM | POA: Diagnosis not present

## 2021-04-17 DIAGNOSIS — Z7409 Other reduced mobility: Secondary | ICD-10-CM | POA: Diagnosis not present

## 2021-04-17 DIAGNOSIS — E876 Hypokalemia: Secondary | ICD-10-CM | POA: Diagnosis not present

## 2021-04-17 DIAGNOSIS — S300XXA Contusion of lower back and pelvis, initial encounter: Secondary | ICD-10-CM | POA: Diagnosis not present

## 2021-04-17 DIAGNOSIS — I272 Pulmonary hypertension, unspecified: Secondary | ICD-10-CM | POA: Diagnosis not present

## 2021-04-17 DIAGNOSIS — R296 Repeated falls: Secondary | ICD-10-CM | POA: Diagnosis not present

## 2021-04-17 DIAGNOSIS — I5023 Acute on chronic systolic (congestive) heart failure: Secondary | ICD-10-CM | POA: Diagnosis not present

## 2021-04-17 DIAGNOSIS — I1311 Hypertensive heart and chronic kidney disease without heart failure, with stage 5 chronic kidney disease, or end stage renal disease: Secondary | ICD-10-CM | POA: Diagnosis not present

## 2021-04-17 DIAGNOSIS — F17210 Nicotine dependence, cigarettes, uncomplicated: Secondary | ICD-10-CM | POA: Diagnosis not present

## 2021-04-17 DIAGNOSIS — J811 Chronic pulmonary edema: Secondary | ICD-10-CM | POA: Diagnosis not present

## 2021-04-17 DIAGNOSIS — I517 Cardiomegaly: Secondary | ICD-10-CM | POA: Diagnosis not present

## 2021-04-17 DIAGNOSIS — I132 Hypertensive heart and chronic kidney disease with heart failure and with stage 5 chronic kidney disease, or end stage renal disease: Secondary | ICD-10-CM | POA: Diagnosis not present

## 2021-04-17 DIAGNOSIS — E161 Other hypoglycemia: Secondary | ICD-10-CM | POA: Diagnosis not present

## 2021-04-17 DIAGNOSIS — N189 Chronic kidney disease, unspecified: Secondary | ICD-10-CM | POA: Diagnosis not present

## 2021-04-17 DIAGNOSIS — R531 Weakness: Secondary | ICD-10-CM | POA: Diagnosis not present

## 2021-04-17 DIAGNOSIS — N185 Chronic kidney disease, stage 5: Secondary | ICD-10-CM | POA: Diagnosis not present

## 2021-04-17 DIAGNOSIS — N179 Acute kidney failure, unspecified: Secondary | ICD-10-CM | POA: Diagnosis not present

## 2021-04-17 DIAGNOSIS — I5022 Chronic systolic (congestive) heart failure: Secondary | ICD-10-CM | POA: Diagnosis not present

## 2021-04-17 DIAGNOSIS — M6281 Muscle weakness (generalized): Secondary | ICD-10-CM | POA: Diagnosis not present

## 2021-04-17 DIAGNOSIS — E162 Hypoglycemia, unspecified: Secondary | ICD-10-CM | POA: Diagnosis not present

## 2021-04-17 DIAGNOSIS — S5002XA Contusion of left elbow, initial encounter: Secondary | ICD-10-CM | POA: Diagnosis not present

## 2021-04-17 DIAGNOSIS — M5136 Other intervertebral disc degeneration, lumbar region: Secondary | ICD-10-CM | POA: Diagnosis not present

## 2021-04-17 DIAGNOSIS — I5020 Unspecified systolic (congestive) heart failure: Secondary | ICD-10-CM | POA: Diagnosis not present

## 2021-04-17 DIAGNOSIS — E871 Hypo-osmolality and hyponatremia: Secondary | ICD-10-CM | POA: Diagnosis not present

## 2021-04-17 DIAGNOSIS — R4182 Altered mental status, unspecified: Secondary | ICD-10-CM | POA: Diagnosis not present

## 2021-04-17 DIAGNOSIS — K7460 Unspecified cirrhosis of liver: Secondary | ICD-10-CM | POA: Diagnosis not present

## 2021-04-17 DIAGNOSIS — I429 Cardiomyopathy, unspecified: Secondary | ICD-10-CM | POA: Diagnosis not present

## 2021-04-17 DIAGNOSIS — I10 Essential (primary) hypertension: Secondary | ICD-10-CM | POA: Diagnosis not present

## 2021-04-17 DIAGNOSIS — I959 Hypotension, unspecified: Secondary | ICD-10-CM | POA: Diagnosis not present

## 2021-04-17 DIAGNOSIS — N184 Chronic kidney disease, stage 4 (severe): Secondary | ICD-10-CM | POA: Diagnosis not present

## 2021-04-17 DIAGNOSIS — K8020 Calculus of gallbladder without cholecystitis without obstruction: Secondary | ICD-10-CM | POA: Diagnosis not present

## 2021-04-17 DIAGNOSIS — I428 Other cardiomyopathies: Secondary | ICD-10-CM | POA: Diagnosis not present

## 2021-04-17 DIAGNOSIS — D649 Anemia, unspecified: Secondary | ICD-10-CM | POA: Diagnosis not present

## 2021-04-17 DIAGNOSIS — E1122 Type 2 diabetes mellitus with diabetic chronic kidney disease: Secondary | ICD-10-CM | POA: Diagnosis not present

## 2021-04-17 DIAGNOSIS — R188 Other ascites: Secondary | ICD-10-CM | POA: Diagnosis not present

## 2021-04-17 DIAGNOSIS — I083 Combined rheumatic disorders of mitral, aortic and tricuspid valves: Secondary | ICD-10-CM | POA: Diagnosis not present

## 2021-04-17 DIAGNOSIS — R7401 Elevation of levels of liver transaminase levels: Secondary | ICD-10-CM | POA: Diagnosis not present

## 2021-04-17 DIAGNOSIS — I358 Other nonrheumatic aortic valve disorders: Secondary | ICD-10-CM | POA: Diagnosis not present

## 2021-04-17 DIAGNOSIS — I509 Heart failure, unspecified: Secondary | ICD-10-CM | POA: Diagnosis not present

## 2021-04-17 DIAGNOSIS — N2889 Other specified disorders of kidney and ureter: Secondary | ICD-10-CM | POA: Diagnosis not present

## 2021-05-04 DIAGNOSIS — D631 Anemia in chronic kidney disease: Secondary | ICD-10-CM | POA: Diagnosis not present

## 2021-05-04 DIAGNOSIS — N2581 Secondary hyperparathyroidism of renal origin: Secondary | ICD-10-CM | POA: Diagnosis not present

## 2021-05-04 DIAGNOSIS — N185 Chronic kidney disease, stage 5: Secondary | ICD-10-CM | POA: Diagnosis not present

## 2021-05-04 DIAGNOSIS — I120 Hypertensive chronic kidney disease with stage 5 chronic kidney disease or end stage renal disease: Secondary | ICD-10-CM | POA: Diagnosis not present

## 2021-05-08 DIAGNOSIS — N185 Chronic kidney disease, stage 5: Secondary | ICD-10-CM | POA: Diagnosis not present

## 2021-05-12 DIAGNOSIS — N185 Chronic kidney disease, stage 5: Secondary | ICD-10-CM | POA: Diagnosis not present

## 2021-05-14 DIAGNOSIS — E871 Hypo-osmolality and hyponatremia: Secondary | ICD-10-CM | POA: Diagnosis not present

## 2021-05-14 DIAGNOSIS — K7030 Alcoholic cirrhosis of liver without ascites: Secondary | ICD-10-CM | POA: Diagnosis not present

## 2021-05-14 DIAGNOSIS — N185 Chronic kidney disease, stage 5: Secondary | ICD-10-CM | POA: Diagnosis not present

## 2021-05-20 DIAGNOSIS — E871 Hypo-osmolality and hyponatremia: Secondary | ICD-10-CM | POA: Diagnosis not present

## 2021-06-01 DIAGNOSIS — N185 Chronic kidney disease, stage 5: Secondary | ICD-10-CM | POA: Diagnosis not present

## 2021-06-01 DIAGNOSIS — N189 Chronic kidney disease, unspecified: Secondary | ICD-10-CM | POA: Diagnosis not present

## 2021-06-02 DIAGNOSIS — I120 Hypertensive chronic kidney disease with stage 5 chronic kidney disease or end stage renal disease: Secondary | ICD-10-CM | POA: Diagnosis not present

## 2021-06-02 DIAGNOSIS — N185 Chronic kidney disease, stage 5: Secondary | ICD-10-CM | POA: Diagnosis not present

## 2021-06-02 DIAGNOSIS — D631 Anemia in chronic kidney disease: Secondary | ICD-10-CM | POA: Diagnosis not present

## 2021-06-02 DIAGNOSIS — N2581 Secondary hyperparathyroidism of renal origin: Secondary | ICD-10-CM | POA: Diagnosis not present

## 2021-06-12 DIAGNOSIS — N185 Chronic kidney disease, stage 5: Secondary | ICD-10-CM | POA: Diagnosis not present

## 2021-06-15 DIAGNOSIS — N2581 Secondary hyperparathyroidism of renal origin: Secondary | ICD-10-CM | POA: Diagnosis not present

## 2021-06-15 DIAGNOSIS — D631 Anemia in chronic kidney disease: Secondary | ICD-10-CM | POA: Diagnosis not present

## 2021-06-15 DIAGNOSIS — I129 Hypertensive chronic kidney disease with stage 1 through stage 4 chronic kidney disease, or unspecified chronic kidney disease: Secondary | ICD-10-CM | POA: Diagnosis not present

## 2021-06-15 DIAGNOSIS — N184 Chronic kidney disease, stage 4 (severe): Secondary | ICD-10-CM | POA: Diagnosis not present

## 2021-06-16 ENCOUNTER — Ambulatory Visit (HOSPITAL_COMMUNITY)
Admission: RE | Admit: 2021-06-16 | Discharge: 2021-06-16 | Disposition: A | Discharge status: Home / Self Care | Payer: BC Managed Care – PPO | Source: Ambulatory Visit | Attending: Nephrology | Admitting: Nephrology

## 2021-06-16 VITALS — BP 119/58 | HR 74 | Temp 97.2°F | Resp 18

## 2021-06-16 DIAGNOSIS — N184 Chronic kidney disease, stage 4 (severe): Secondary | ICD-10-CM | POA: Diagnosis not present

## 2021-06-16 DIAGNOSIS — D631 Anemia in chronic kidney disease: Secondary | ICD-10-CM | POA: Insufficient documentation

## 2021-06-16 LAB — FERRITIN: Ferritin: 519 ng/mL — ABNORMAL HIGH (ref 24–336)

## 2021-06-16 LAB — IRON AND TIBC
Iron: 67 ug/dL (ref 45–182)
Saturation Ratios: 32 % (ref 17.9–39.5)
TIBC: 210 ug/dL — ABNORMAL LOW (ref 250–450)
UIBC: 143 ug/dL

## 2021-06-16 LAB — POCT HEMOGLOBIN-HEMACUE: Hemoglobin: 9 g/dL — ABNORMAL LOW (ref 13.0–17.0)

## 2021-06-16 MED ORDER — EPOETIN ALFA-EPBX 10000 UNIT/ML IJ SOLN
INTRAMUSCULAR | Status: AC
  Administered 2021-06-16: 10000 [IU] via SUBCUTANEOUS
  Filled 2021-06-16: qty 1

## 2021-06-16 MED ORDER — EPOETIN ALFA-EPBX 10000 UNIT/ML IJ SOLN: 10000.0000 [IU] | INTRAMUSCULAR | Status: DC

## 2021-07-01 ENCOUNTER — Other Ambulatory Visit: Payer: Self-pay | Admitting: *Deleted

## 2021-07-01 DIAGNOSIS — N185 Chronic kidney disease, stage 5: Secondary | ICD-10-CM

## 2021-07-03 ENCOUNTER — Ambulatory Visit: Payer: BC Managed Care – PPO | Admitting: Vascular Surgery

## 2021-07-03 ENCOUNTER — Other Ambulatory Visit: Payer: Self-pay

## 2021-07-03 ENCOUNTER — Emergency Department (HOSPITAL_COMMUNITY)
Admission: EM | Admit: 2021-07-03 | Discharge: 2021-07-03 | Disposition: A | Discharge status: Home / Self Care | Payer: BC Managed Care – PPO | Attending: Vascular Surgery | Admitting: Vascular Surgery

## 2021-07-03 ENCOUNTER — Ambulatory Visit (INDEPENDENT_AMBULATORY_CARE_PROVIDER_SITE_OTHER)
Admission: RE | Admit: 2021-07-03 | Discharge: 2021-07-03 | Disposition: A | Discharge status: Home / Self Care | Payer: BC Managed Care – PPO | Source: Ambulatory Visit | Attending: Vascular Surgery | Admitting: Vascular Surgery

## 2021-07-03 ENCOUNTER — Encounter (HOSPITAL_COMMUNITY): Payer: Self-pay

## 2021-07-03 ENCOUNTER — Encounter: Payer: Self-pay | Admitting: Vascular Surgery

## 2021-07-03 VITALS — BP 117/74 | HR 63 | Temp 97.6°F | Resp 20

## 2021-07-03 DIAGNOSIS — K769 Liver disease, unspecified: Secondary | ICD-10-CM | POA: Insufficient documentation

## 2021-07-03 DIAGNOSIS — Z5321 Procedure and treatment not carried out due to patient leaving prior to being seen by health care provider: Secondary | ICD-10-CM | POA: Diagnosis not present

## 2021-07-03 DIAGNOSIS — N184 Chronic kidney disease, stage 4 (severe): Secondary | ICD-10-CM | POA: Insufficient documentation

## 2021-07-03 DIAGNOSIS — I509 Heart failure, unspecified: Secondary | ICD-10-CM | POA: Insufficient documentation

## 2021-07-03 DIAGNOSIS — N185 Chronic kidney disease, stage 5: Secondary | ICD-10-CM | POA: Insufficient documentation

## 2021-07-03 DIAGNOSIS — R1900 Intra-abdominal and pelvic swelling, mass and lump, unspecified site: Secondary | ICD-10-CM | POA: Diagnosis not present

## 2021-07-03 DIAGNOSIS — R109 Unspecified abdominal pain: Secondary | ICD-10-CM | POA: Diagnosis not present

## 2021-07-03 DIAGNOSIS — I130 Hypertensive heart and chronic kidney disease with heart failure and stage 1 through stage 4 chronic kidney disease, or unspecified chronic kidney disease: Secondary | ICD-10-CM | POA: Insufficient documentation

## 2021-07-03 DIAGNOSIS — R0602 Shortness of breath: Secondary | ICD-10-CM | POA: Diagnosis not present

## 2021-07-03 NOTE — Progress Notes (Signed)
Office Note     CC:  ESRD Requesting Provider:  Mateo Flow, MD  HPI: Benjamin [name] is a Right handed 65 y.o. (1956/06/03) male with kidney disease who presents at the request of Mateo Flow, MD for permanent HD access. The patient has had no prior access procedures. He is not currently on dialysis. His kidney failure is from diabetes mellitus and hypertension.  So has heart failure, pulmonary hypertension, hepatic congestion.  Patient presented to the office today in a wheelchair accompanied by his son.  We discussed the role of a fistula in regards to dialysis.  He is aware that while his kidneys are still functioning, this may not last much longer.  The pt is not on a statin for cholesterol management.  The pt is not on a daily aspirin.   Other AC:  - The pt is  on medications for hypertension.   The pt is  diabetic. Tobacco hx:  smokeless  Past Medical History:  Diagnosis Date   AKI (acute kidney injury) ([place]) 02/2020   Diabetes mellitus without complication (HCC)    Hypertension     Past Surgical History:  Procedure Laterality Date   WISDOM TOOTH EXTRACTION      Social History   Socioeconomic History   Marital status: Married    Spouse name: Not on file   Number of children: Not on file   Years of education: Not on file   Highest education level: Not on file  Occupational History   Not on file  Tobacco Use   Smoking status: Never   Smokeless tobacco: Current    Types: Chew  Vaping Use   Vaping Use: Never used  Substance and Sexual Activity   Alcohol use: Not Currently   Drug use: Never   Sexual activity: Not on file  Other Topics Concern   Not on file  Social History Narrative   Not on file   Social Determinants of Health   Financial Resource Strain: Not on file  Food Insecurity: Not on file  Transportation Needs: Not on file  Physical Activity: Not on file  Stress: Not on file  Social Connections: Not on file  Intimate Partner Violence: Not  on file    Family History  Problem Relation Age of Onset   Stroke Mother    Stroke Father     Current Outpatient Medications  Medication Sig Dispense Refill   acetaminophen (TYLENOL) 325 MG tablet Take 2 tablets (650 mg total) by mouth every 6 (six) hours as needed for mild pain (or Fever >/= 101). 20 tablet 0   albuterol (VENTOLIN HFA) 108 (90 Base) MCG/ACT inhaler Can inhale two puffs every four to six hours as needed for cough, wheeze, shortness of breath, or chest tightness. 8.5 g 1   budesonide-formoterol (SYMBICORT) 160-4.5 MCG/ACT inhaler Inhale 2 puffs into the lungs 2 (two) times daily. Rinse, gargle and spit after use. 1 each 5   folic acid-pyridoxine-cyancobalamin (FOLTX) 2.5-25-2 MG TABS tablet Take by mouth.     hydrocortisone cream 0.5 % Apply topically 2 (two) times daily. 30 g 0   hydroxypropyl methylcellulose / hypromellose (ISOPTO TEARS / GONIOVISC) 2.5 % ophthalmic solution Place 1 drop into both eyes daily as needed for dry eyes.     ipratropium (ATROVENT) 0.06 % nasal spray INSTILL 2 SPRAY INTO BOTH NOSTRILS TWICEDAILY 15 mL 5   isosorbide mononitrate (IMDUR) 30 MG 24 hr tablet Take 30 mg by mouth daily.  ondansetron (ZOFRAN) 4 MG tablet Take 1 tablet (4 mg total) by mouth every 6 (six) hours as needed for nausea. 20 tablet 0   sodium bicarbonate 650 MG tablet Take 2 tablets (1,300 mg total) by mouth 3 (three) times daily. 120 tablet 0   thiamine 100 MG tablet Take by mouth.     torsemide (DEMADEX) 20 MG tablet Take 20 mg by mouth 2 (two) times daily.     furosemide (LASIX) 80 MG tablet Take 1 tablet (80 mg total) by mouth 2 (two) times daily for 3 days, THEN 1 tablet (80 mg total) daily. 36 tablet 0   hydrALAZINE (APRESOLINE) 10 MG tablet Take by mouth.     No current facility-administered medications for this visit.    Allergies  Allergen Reactions   Grass Pollen(K-O-R-T-Swt Vern) Other (See Comments)    Eyes run, shortness of breath, itching      REVIEW  OF SYSTEMS:   [X]  denotes positive finding, [ ]  denotes negative finding Cardiac  Comments:  Chest pain or chest pressure:    Shortness of breath upon exertion:    Short of breath when lying flat:    Irregular heart rhythm:        Vascular    Pain in calf, thigh, or hip brought on by ambulation:    Pain in feet at night that wakes you up from your sleep:     Blood clot in your veins:    Leg swelling:         Pulmonary    Oxygen at home:    Productive cough:     Wheezing:         Neurologic    Sudden weakness in arms or legs:     Sudden numbness in arms or legs:     Sudden onset of difficulty speaking or slurred speech:    Temporary loss of vision in one eye:     Problems with dizziness:         Gastrointestinal    Blood in stool:     Vomited blood:         Genitourinary    Burning when urinating:     Blood in urine:        Psychiatric    Major depression:         Hematologic    Bleeding problems:    Problems with blood clotting too easily:        Skin    Rashes or ulcers:        Constitutional    Fever or chills:      PHYSICAL EXAMINATION:  Vitals:   07/03/21 1506  BP: 117/74  Pulse: 63  Resp: 20  Temp: 97.6 F (36.4 C)  SpO2: 97%    General:  WDWN in NAD; vital signs documented above Gait: Not observed HENT: WNL, normocephalic Pulmonary: normal non-labored breathing , without Rales, rhonchi,  wheezing Cardiac: regular HR,  Abdomen: soft, NT, no masses Skin: with rashes on bilateral arms Vascular Exam/Pulses:  Right Left  Radial 2+ (normal) 2+ (normal)  Ulnar 2+ (normal) 2+ (normal)                   Extremities: without ischemic changes, without Gangrene , without cellulitis; without open wounds;  Musculoskeletal: no muscle wasting or atrophy  Neurologic: A&O X 3;  No focal weakness or paresthesias are detected Psychiatric:  The pt has Normal affect.   Non-Invasive Vascular Imaging:   Noninvasive vascular  imaging was reviewed  demonstrating adequate right-sided cephalic, left-sided cephalic and basilic veins for dialysis access Arteries are of adequate size    ASSESSMENT/PLAN:  Benjamin [name] is a 65 y.o. male who presents with chronic kidney disease stage 4  Based on vein mapping and examination, he would be best suited with left-sided brachiocephalic fistula.. I had an extensive discussion with this patient in regards to the nature of access surgery, including risk, benefits, and alternatives.   The patient is aware that the risks of access surgery include but are not limited to: bleeding, infection, steal syndrome, nerve damage, ischemic monomelic neuropathy, failure of access to mature, complications related to venous hypertension, and possible need for additional access procedures in the future. The patient has agreed to proceed with the above procedure which will be scheduled .  [name], MD [facility] [phone]

## 2021-07-03 NOTE — ED Triage Notes (Signed)
Pt states abd swelling that has been going on for a few weeks and SOB that started this afternoon. Hx of stage 4 kidney failure, CHF, liver congestion, pulmonary HTN.

## 2021-07-04 DIAGNOSIS — E1122 Type 2 diabetes mellitus with diabetic chronic kidney disease: Secondary | ICD-10-CM | POA: Diagnosis not present

## 2021-07-04 DIAGNOSIS — I509 Heart failure, unspecified: Secondary | ICD-10-CM | POA: Diagnosis not present

## 2021-07-04 DIAGNOSIS — R109 Unspecified abdominal pain: Secondary | ICD-10-CM | POA: Diagnosis not present

## 2021-07-04 DIAGNOSIS — I130 Hypertensive heart and chronic kidney disease with heart failure and stage 1 through stage 4 chronic kidney disease, or unspecified chronic kidney disease: Secondary | ICD-10-CM | POA: Diagnosis not present

## 2021-07-04 DIAGNOSIS — R188 Other ascites: Secondary | ICD-10-CM | POA: Diagnosis not present

## 2021-07-04 DIAGNOSIS — N189 Chronic kidney disease, unspecified: Secondary | ICD-10-CM | POA: Diagnosis not present

## 2021-07-04 DIAGNOSIS — K7031 Alcoholic cirrhosis of liver with ascites: Secondary | ICD-10-CM | POA: Diagnosis not present

## 2021-07-04 DIAGNOSIS — N184 Chronic kidney disease, stage 4 (severe): Secondary | ICD-10-CM | POA: Diagnosis not present

## 2021-07-10 DIAGNOSIS — D631 Anemia in chronic kidney disease: Secondary | ICD-10-CM | POA: Diagnosis not present

## 2021-07-10 DIAGNOSIS — N184 Chronic kidney disease, stage 4 (severe): Secondary | ICD-10-CM | POA: Diagnosis not present

## 2021-07-10 DIAGNOSIS — N2581 Secondary hyperparathyroidism of renal origin: Secondary | ICD-10-CM | POA: Diagnosis not present

## 2021-07-10 DIAGNOSIS — I129 Hypertensive chronic kidney disease with stage 1 through stage 4 chronic kidney disease, or unspecified chronic kidney disease: Secondary | ICD-10-CM | POA: Diagnosis not present

## 2021-07-13 ENCOUNTER — Telehealth: Payer: Self-pay

## 2021-07-13 NOTE — Telephone Encounter (Signed)
Spoke with patient's wife. Reports renal doctor- Dr. Royce Macadamia suggested postponing left arm AVF surgery from tomorrow to a few weeks due to pt had 40 lbs of water weight of 40 lbs. Surgery rescheduled to 08/04/21.

## 2021-07-14 ENCOUNTER — Encounter (HOSPITAL_COMMUNITY): Payer: Self-pay

## 2021-07-14 ENCOUNTER — Ambulatory Visit (HOSPITAL_COMMUNITY)
Admission: RE | Admit: 2021-07-14 | Payer: BC Managed Care – PPO | Source: Home / Self Care | Admitting: Vascular Surgery

## 2021-07-14 ENCOUNTER — Encounter (HOSPITAL_COMMUNITY): Payer: BC Managed Care – PPO

## 2021-07-14 SURGERY — ARTERIOVENOUS (AV) FISTULA CREATION
Anesthesia: Monitor Anesthesia Care | Laterality: Left

## 2021-07-16 DIAGNOSIS — N184 Chronic kidney disease, stage 4 (severe): Secondary | ICD-10-CM | POA: Diagnosis not present

## 2021-07-20 DIAGNOSIS — I129 Hypertensive chronic kidney disease with stage 1 through stage 4 chronic kidney disease, or unspecified chronic kidney disease: Secondary | ICD-10-CM | POA: Diagnosis not present

## 2021-07-20 DIAGNOSIS — D631 Anemia in chronic kidney disease: Secondary | ICD-10-CM | POA: Diagnosis not present

## 2021-07-20 DIAGNOSIS — N184 Chronic kidney disease, stage 4 (severe): Secondary | ICD-10-CM | POA: Diagnosis not present

## 2021-07-20 DIAGNOSIS — N2581 Secondary hyperparathyroidism of renal origin: Secondary | ICD-10-CM | POA: Diagnosis not present

## 2021-07-23 DIAGNOSIS — N185 Chronic kidney disease, stage 5: Secondary | ICD-10-CM | POA: Diagnosis not present

## 2021-07-23 DIAGNOSIS — E871 Hypo-osmolality and hyponatremia: Secondary | ICD-10-CM | POA: Diagnosis not present

## 2021-07-23 DIAGNOSIS — I5023 Acute on chronic systolic (congestive) heart failure: Secondary | ICD-10-CM | POA: Diagnosis not present

## 2021-07-28 ENCOUNTER — Other Ambulatory Visit (HOSPITAL_COMMUNITY): Payer: Self-pay | Admitting: Nephrology

## 2021-07-28 ENCOUNTER — Other Ambulatory Visit: Payer: Self-pay | Admitting: Nephrology

## 2021-07-28 DIAGNOSIS — R188 Other ascites: Secondary | ICD-10-CM

## 2021-07-28 DIAGNOSIS — D631 Anemia in chronic kidney disease: Secondary | ICD-10-CM | POA: Diagnosis not present

## 2021-07-28 DIAGNOSIS — I129 Hypertensive chronic kidney disease with stage 1 through stage 4 chronic kidney disease, or unspecified chronic kidney disease: Secondary | ICD-10-CM | POA: Diagnosis not present

## 2021-07-28 DIAGNOSIS — N184 Chronic kidney disease, stage 4 (severe): Secondary | ICD-10-CM | POA: Diagnosis not present

## 2021-07-28 DIAGNOSIS — N2581 Secondary hyperparathyroidism of renal origin: Secondary | ICD-10-CM | POA: Diagnosis not present

## 2021-08-03 ENCOUNTER — Encounter (HOSPITAL_COMMUNITY): Payer: Self-pay | Admitting: Vascular Surgery

## 2021-08-03 ENCOUNTER — Ambulatory Visit (HOSPITAL_COMMUNITY): Payer: BC Managed Care – PPO | Admitting: Vascular Surgery

## 2021-08-03 NOTE — Progress Notes (Signed)
DUE TO COVID-19 ONLY ONE VISITOR IS ALLOWED TO COME WITH YOU AND STAY IN THE WAITING ROOM ONLY DURING PRE OP AND PROCEDURE DAY OF SURGERY.   PCP - Dr Bertram Millard Cardiologist - Dr Alyssa Grove  Chest x-ray - 04/22/21 CE EKG - 03/26/21 Stress Test - n/a ECHO - 04/18/21 Cardiac Cath - n/a  ICD Pacemaker/Loop - n/a  Sleep Study -  n/a CPAP - none  Diabetes - Yes, diet controlled  Anesthesia review: Yes  STOP now taking any Aspirin (unless otherwise instructed by your surgeon), Aleve, Naproxen, Ibuprofen, Motrin, Advil, Goody's, BC's, all herbal medications, fish oil, and all vitamins.   Coronavirus Screening Covid test n/a Ambulatory Surgery  Do you have any of the following symptoms:  Cough yes/no: No Fever (>100.75F)  yes/no: No Runny nose yes/no: No Sore throat yes/no: No Difficulty breathing/shortness of breath  Yes  Have you traveled in the last 14 days and where? yes/no: No  Wife Butch Penny verbalized understanding of instructions that were given via phone.

## 2021-08-03 NOTE — Progress Notes (Signed)
Anesthesia Chart Review:  Case: [id number] Date/Time: 08/04/21 0715   Procedure: LEFT ARM ARTERIOVENOUS (AV) FISTULA CREATION (Left) - PERIPHERAL NERVE BLOCK   Anesthesia type: Monitor Anesthesia Care   Pre-op diagnosis: ESRD   Location: [facility] OR ROOM 12 / [facility]   Surgeons: [name], MD       DISCUSSION: Patient is a 65 year old Benjamin [name] scheduled for the above procedure. Surgery postponed from 07/14/21 to 08/04/21 due to acute on chronic HF with weight up ~ 35-40lb. Patient declined admission. Since then, diuretic therapy adjusted by nephrology cardiology. 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 He has had two admissions since July 2022 for severe hyponatremia.   Other history includes never smoker, HTN, DM2 (diet controlled), HFrEF, dyspnea, CKD (stage IV-V).   - [facility] admission 04/17/21-04/24/21 for symptomatic hyponatremia at 107 and also diagnosed with new cardiomyopathy.  He had poor intake with fall and slurred speech at home. Admitted to ICU, started on 3% NS. Given IV Lasix. Echo showed mild LVH, severely reduced LVF with EF 20-25%, global hypokinesis, moderate-severely dilated RV, severely dilated RA, mild AR/MR, moderate TR, severe pulmonary hypertension RVSP 76. Nephrology and cardiology consulted. Cardiomyopathy felt likely secondary to prior heavy alcohol drinking, as well as DM, HTN. Continue diuresis. Consider RHC/LHC in the future, but on hold due to worsening CKD. Na improved to 123 by discharge.  - [facility] admission 03/26/21-04/01/21 for hyponatremia (Na 117) in setting of CKD stage V. Notes also indicate history of consuming a significant quantity of water daily and prior heavy EtOH use.  Hyponatremia treatment included NS infusion, tolvaptan, Lasix (for volume overload). Discharged with close nephrology follow-up.  Last cardiology visit with Dr. Francene Castle  was on 07/23/21.  Patient had further weight gain and LE/scrotal edema. Nephrologist recently escalated his torsemide to 80 mg BID for a few extra     due to increased edema. He gave metolazone 5 mg, but felt "he likely has bowel edema and will not respond to oral medications. I reviewed this with the patient and his 2 sons. I recommended a direct admit which he is not interested in today but is willing to consider tomorrow. He has been in the ER twice and left twice, so direct admit would be preferable. Needs to stop salt." Two month follow-up planned.   Anesthesia team to evaluate on the day of surgery.   VS: Ht 6' (1.829 m)   Wt 83.9 kg   BMI 25.09 kg/m  07/03/21 1506: BP: 117/74  Pulse: 63  Resp: 20  Temp: 97.6 F (36.4 C)  SpO2: 97%     PROVIDERS: Mateo Flow, MD is PCP  Alyssa Grove, MD is cardiologist Harrie Jeans, MD is nephrologist   LABS: For day of surgery. As of 04/22/21-04/24/21 ([facility]), Na 123, BUN 43, Cr 3.Benjamin, eGFR 18, glucose 93, AST 20, ALT 11, WBC 4.9, H/H 11.0/33.0, PLT 173. HGB 9.0 06/16/21.    Spirometry 03/18/20: Post FVC 3.04 ((67%), FEV1 2.01 (59%). Pre interpretation: Moderate restriction. Mild airways obstruction. Post Interpretation: Mild restriction.  Mild airways obstruction.  Slight improvement postbronchodilator.   IMAGES: CXR 04/22/21 ([facility]): FINDINGS:  1004 hours. The cardio pericardial silhouette is enlarged. There is  pulmonary vascular congestion without overt pulmonary edema. Diffuse  interstitial opacity noted, potentially related to edema. Old  right-sided rib fractures evident. Telemetry leads overlie the  chest.  IMPRESSION:  Cardiomegaly with vascular congestion and probable interstitial  edema.   CT Head 04/17/21 ([facility]): IMPRESSION:  - No acute or focal intracranial finding. Mild atrophy with chronic  small-vessel ischemic change of the white matter.  - Extensive vascular calcification.  - Mild soft tissue  swelling of the soft tissues of the right scalp.    CT abd/pelvis 04/17/21 ([facility]): IMPRESSION:  - No acute finding.  - Cardiomegaly. Coronary artery calcification. Aortic atherosclerotic  calcification. Extensive small vessel calcification consistent with  chronic diabetes.  - No evidence of acute or significant organ pathology. There may be a  few small nonobstructing renal calculi, but no evidence of passing  stone.    EKG: 7/221/22: Accelerated junctional rhythm vs sinus with very long pr Ventricular premature complex Probable left ventricular hypertrophy Prolonged QT interval Confirmed by Davonna Belling [phone]) on 03/27/2021 6:12:40 PM   CV: Echo 04/18/21 ([facility]): SUMMARY  There is mild concentric left ventricular hypertrophy.  Left ventricular systolic function is severely reduced.  There is severe global hypokinesis of the left ventricle.  LV ejection fraction = 20-25%.  The right ventricle is moderate to severely dilated.  The right atrium is severely dilated.  Diffuse thickening of the aortic valve with restricted cusp opening.  Aortic valve mean pressure gradient is 9 mmHg.  There is no aortic stenosis.  There is mild aortic regurgitation.  There is mild mitral regurgitation.  There is moderate tricuspid regurgitation.  Estimated right ventricular systolic pressure is 76 mmHg.  Severe pulmonary hypertension.  There is no comparison study available.     Past Medical History:  Diagnosis Date   AKI (acute kidney injury) ([place]) 02/2020   stage 4   CHF (congestive heart failure) (HCC)    Diabetes mellitus without complication (HCC)    diet controlled   Dyspnea    Hypertension     Past Surgical History:  Procedure Laterality Date   WISDOM TOOTH EXTRACTION      MEDICATIONS: No current facility-administered medications for this encounter.    albuterol (VENTOLIN HFA) 108 (90 Base) MCG/ACT inhaler   budesonide-formoterol (SYMBICORT) 160-4.5  MCG/ACT inhaler   cholecalciferol (VITAMIN D) 25 MCG (1000 UNIT) tablet   folic acid-pyridoxine-cyancobalamin (NIVA-FOL) 2.5-25-2 MG TABS tablet   hydrALAZINE (APRESOLINE) 10 MG tablet   hydrocortisone cream 0.5 %   metolazone (ZAROXOLYN) 5 MG tablet   torsemide (DEMADEX) 20 MG tablet   acetaminophen (TYLENOL) 325 MG tablet   furosemide (LASIX) 80 MG tablet   ipratropium (ATROVENT) 0.06 % nasal spray   ondansetron (ZOFRAN) 4 MG tablet   sodium bicarbonate 650 MG tablet    Myra Gianotti, PA-C Surgical Short Stay/Anesthesiology [facility] Phone [phone] [facility] Phone [phone] 08/03/2021 5:10 PM

## 2021-08-03 NOTE — Anesthesia Preprocedure Evaluation (Deleted)
Anesthesia Evaluation    Airway        Dental   Pulmonary           Cardiovascular hypertension,      Neuro/Psych negative neurological ROS  negative psych ROS   GI/Hepatic   Endo/Other  diabetes  Renal/GU      Musculoskeletal   Abdominal   Peds  Hematology  (+) Blood dyscrasia, anemia , Lab Results      Component                Value               Date                      WBC                      4.0                 04/01/2021                HGB                      9.0 (L)             06/16/2021                HCT                      36.9 (L)            04/01/2021                MCV                      89.1                04/01/2021                PLT                      143 (L)             04/01/2021              Anesthesia Other Findings Patient is a 65 year old male scheduled for the above procedure. Surgery postponed from 07/14/21 to 08/04/21 due to acute on chronic HF with weight up ~ 35-40lb. Patient declined admission. Since then, diuretic therapy adjusted by nephrology cardiology. 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 He has had two admissions since July 2022 for severe hyponatremia.   Other history includes never smoker, HTN, DM2 (diet controlled), HFrEF, dyspnea, CKD (stage IV-V).   - [facility] admission 04/17/21-04/24/21 for symptomatic hyponatremia at 107 and also diagnosed with new cardiomyopathy.  He had poor intake with fall and slurred speech at home. Admitted to ICU, started on 3% NS. Given IV Lasix. Echo showed mild LVH, severely reduced LVF with EF 20-25%, global hypokinesis, moderate-severely dilated RV, severely dilated RA, mild AR/MR, moderate TR, severe pulmonary hypertension RVSP 76. Nephrology and cardiology consulted. Cardiomyopathy felt likely secondary to prior heavy  alcohol drinking, as well as DM, HTN. Continue diuresis. Consider RHC/LHC in the future, but on hold due to worsening CKD. Na improved to 123 by discharge.  -  admission 03/26/21-04/01/21 for hyponatremia (  Na 117) in setting of CKD stage V. Notes also indicate history of consuming a significant quantity of water daily and prior heavy EtOH use.  Hyponatremia treatment included NS infusion, tolvaptan, Lasix (for volume overload). Discharged with close nephrology follow-up.  Last cardiology visit with Dr. Francene Castle was on 07/23/21.  Patient had further weight gain and LE/scrotal edema. Nephrologist recently escalated his torsemide to 80 mg BID for a few extra     due to increased edema. He gave metolazone 5 mg, but felt "he likely has bowel edema and will not respond to oral medications. I reviewed this with the patient and his 2 sons. I recommended a direct admit which he is not interested in today but is willing to consider tomorrow. He has been in the ER twice and left twice, so direct admit would be preferable. Needs to stop salt." Two month follow-up planned.   Reproductive/Obstetrics                            Anesthesia Physical Anesthesia Plan  ASA:   Anesthesia Plan:    Post-op Pain Management:    Induction:   PONV Risk Score and Plan:   Airway Management Planned:   Additional Equipment:   Intra-op Plan:   Post-operative Plan:   Informed Consent:   Plan Discussed with:   Anesthesia Plan Comments: (See PAT note written 08/03/2021 by Myra Gianotti, PA-C. )        Anesthesia Quick Evaluation

## 2021-08-04 ENCOUNTER — Ambulatory Visit (HOSPITAL_COMMUNITY)
Admission: RE | Admit: 2021-08-04 | Discharge: 2021-08-04 | Disposition: A | Discharge status: Home / Self Care | Payer: BC Managed Care – PPO | Attending: Vascular Surgery | Admitting: Vascular Surgery

## 2021-08-04 ENCOUNTER — Encounter (HOSPITAL_COMMUNITY): Payer: Self-pay | Admitting: Vascular Surgery

## 2021-08-04 ENCOUNTER — Encounter (HOSPITAL_COMMUNITY)
Admission: RE | Disposition: A | Discharge status: Home / Self Care | Payer: Self-pay | Source: Home / Self Care | Attending: Vascular Surgery

## 2021-08-04 ENCOUNTER — Other Ambulatory Visit: Payer: Self-pay

## 2021-08-04 DIAGNOSIS — R188 Other ascites: Secondary | ICD-10-CM | POA: Insufficient documentation

## 2021-08-04 DIAGNOSIS — I5022 Chronic systolic (congestive) heart failure: Secondary | ICD-10-CM | POA: Diagnosis not present

## 2021-08-04 DIAGNOSIS — Z87891 Personal history of nicotine dependence: Secondary | ICD-10-CM | POA: Diagnosis not present

## 2021-08-04 DIAGNOSIS — I272 Pulmonary hypertension, unspecified: Secondary | ICD-10-CM | POA: Diagnosis not present

## 2021-08-04 DIAGNOSIS — E119 Type 2 diabetes mellitus without complications: Secondary | ICD-10-CM | POA: Diagnosis not present

## 2021-08-04 DIAGNOSIS — N185 Chronic kidney disease, stage 5: Secondary | ICD-10-CM | POA: Diagnosis not present

## 2021-08-04 DIAGNOSIS — E871 Hypo-osmolality and hyponatremia: Secondary | ICD-10-CM | POA: Diagnosis not present

## 2021-08-04 DIAGNOSIS — I132 Hypertensive heart and chronic kidney disease with heart failure and with stage 5 chronic kidney disease, or end stage renal disease: Secondary | ICD-10-CM | POA: Insufficient documentation

## 2021-08-04 DIAGNOSIS — Z5309 Procedure and treatment not carried out because of other contraindication: Secondary | ICD-10-CM | POA: Insufficient documentation

## 2021-08-04 DIAGNOSIS — N186 End stage renal disease: Secondary | ICD-10-CM | POA: Insufficient documentation

## 2021-08-04 DIAGNOSIS — E1122 Type 2 diabetes mellitus with diabetic chronic kidney disease: Secondary | ICD-10-CM | POA: Diagnosis not present

## 2021-08-04 HISTORY — DX: Heart failure, unspecified: I50.9

## 2021-08-04 HISTORY — DX: Dyspnea, unspecified: R06.00

## 2021-08-04 LAB — POCT I-STAT, CHEM 8
BUN: 82 mg/dL — ABNORMAL HIGH (ref 8–23)
Calcium, Ion: 0.93 mmol/L — ABNORMAL LOW (ref 1.15–1.40)
Chloride: 80 mmol/L — ABNORMAL LOW (ref 98–111)
Creatinine, Ser: 5.7 mg/dL — ABNORMAL HIGH (ref 0.61–1.24)
Glucose, Bld: 88 mg/dL (ref 70–99)
HCT: 32 % — ABNORMAL LOW (ref 39.0–52.0)
Hemoglobin: 10.9 g/dL — ABNORMAL LOW (ref 13.0–17.0)
Potassium: 3 mmol/L — ABNORMAL LOW (ref 3.5–5.1)
Sodium: 122 mmol/L — ABNORMAL LOW (ref 135–145)
TCO2: 30 mmol/L (ref 22–32)

## 2021-08-04 LAB — GLUCOSE, CAPILLARY: Glucose-Capillary: 84 mg/dL (ref 70–99)

## 2021-08-04 LAB — COMPREHENSIVE METABOLIC PANEL
ALT: 19 U/L (ref 0–44)
AST: 44 U/L — ABNORMAL HIGH (ref 15–41)
Albumin: 3.4 g/dL — ABNORMAL LOW (ref 3.5–5.0)
Alkaline Phosphatase: 166 U/L — ABNORMAL HIGH (ref 38–126)
Anion gap: 15 (ref 5–15)
BUN: 79 mg/dL — ABNORMAL HIGH (ref 8–23)
CO2: 28 mmol/L (ref 22–32)
Calcium: 8.6 mg/dL — ABNORMAL LOW (ref 8.9–10.3)
Chloride: 81 mmol/L — ABNORMAL LOW (ref 98–111)
Creatinine, Ser: 5.19 mg/dL — ABNORMAL HIGH (ref 0.61–1.24)
GFR, Estimated: 12 mL/min — ABNORMAL LOW (ref >60)
Glucose, Bld: 85 mg/dL (ref 70–99)
Potassium: 2.8 mmol/L — ABNORMAL LOW (ref 3.5–5.1)
Sodium: 124 mmol/L — ABNORMAL LOW (ref 135–145)
Total Bilirubin: 1.5 mg/dL — ABNORMAL HIGH (ref 0.3–1.2)
Total Protein: 6.9 g/dL (ref 6.5–8.1)

## 2021-08-04 LAB — SURGICAL PCR SCREEN
MRSA, PCR: POSITIVE — AB
Staphylococcus aureus: POSITIVE — AB

## 2021-08-04 SURGERY — ARTERIOVENOUS (AV) FISTULA CREATION
Anesthesia: Monitor Anesthesia Care | Laterality: Left

## 2021-08-04 MED ORDER — MIDAZOLAM HCL 2 MG/2ML IJ SOLN
INTRAMUSCULAR | Status: AC
  Filled 2021-08-04: qty 2

## 2021-08-04 MED ORDER — LACTATED RINGERS IV SOLN: INTRAVENOUS | Status: DC

## 2021-08-04 MED ORDER — HEPARIN 6000 UNIT IRRIGATION SOLUTION
Status: AC
  Filled 2021-08-04: qty 500

## 2021-08-04 MED ORDER — LIDOCAINE-EPINEPHRINE (PF) 1 %-1:200000 IJ SOLN
INTRAMUSCULAR | Status: AC
  Filled 2021-08-04: qty 30

## 2021-08-04 MED ORDER — ROCURONIUM BROMIDE 10 MG/ML (PF) SYRINGE
PREFILLED_SYRINGE | INTRAVENOUS | Status: AC
  Filled 2021-08-04: qty 10

## 2021-08-04 MED ORDER — LIDOCAINE 2% (20 MG/ML) 5 ML SYRINGE
INTRAMUSCULAR | Status: AC
  Filled 2021-08-04: qty 5

## 2021-08-04 MED ORDER — LIDOCAINE HCL (PF) 1 % IJ SOLN
INTRAMUSCULAR | Status: AC
  Filled 2021-08-04: qty 30

## 2021-08-04 MED ORDER — FENTANYL CITRATE (PF) 250 MCG/5ML IJ SOLN
INTRAMUSCULAR | Status: AC
  Filled 2021-08-04: qty 5

## 2021-08-04 MED ORDER — PROPOFOL 10 MG/ML IV BOLUS
INTRAVENOUS | Status: AC
  Filled 2021-08-04: qty 20

## 2021-08-04 MED ORDER — ONDANSETRON HCL 4 MG/2ML IJ SOLN
INTRAMUSCULAR | Status: AC
  Filled 2021-08-04: qty 2

## 2021-08-04 MED ORDER — CHLORHEXIDINE GLUCONATE 0.12 % MT SOLN
15.0000 mL | Freq: Once | OROMUCOSAL | Status: AC
  Administered 2021-08-04: 15 mL via OROMUCOSAL

## 2021-08-04 MED ORDER — ORAL CARE MOUTH RINSE: 15.0000 mL | Freq: Once | OROMUCOSAL | Status: AC

## 2021-08-04 MED ORDER — CHLORHEXIDINE GLUCONATE 0.12 % MT SOLN
OROMUCOSAL | Status: AC
  Filled 2021-08-04: qty 15

## 2021-08-04 NOTE — Progress Notes (Signed)
Pt. Positive for staph/mrsa. Profend applied to both nares per protocal.

## 2021-08-04 NOTE — Consult Note (Signed)
Consult Note   EMETT STAPEL KWI:[id number] DOB: [birth year] DOA: 08/04/2021  PCP: Mateo Flow, MD Consultants:  Virl Cagey - vascular surgery; [facility] - nephrology; Andria Frames - cardiology; Irene Limbo - oncology Patient coming from:  Home; NOK: Wife, [name], [phone]  Chief Complaint: abnormal lab  HPI: Benjamin [name] is a 65 y.o. male with medical history significant of DM; HTN; chronic systolic CHF; and stage 5 CKD not yet on HD presenting for permanent HD access. The patient was accompanied by his son.  He reports that he has not slept well the last few nights due to anxiety about this pending surgery.  He absolutely will not stay in the hospital overnight, even if recommended.  His son reports that he was hospitalized for severe hyponatremia in July, and at that time they could tell that his Na++ was low due to cognitive slowing.  He has been his usual self recently without concern.  He has hepatic, renal, and cardiac impairments and fluid balance has been problematic, so he sees Dr. Royce Macadamia once or twice a week but has not yet started on HD.  He has recently been on very high-dose diuretics (Lasix 120 mg daily plus metolazone BID by the son's report) and is scheduled for paracentesis as an outpatient on Thursday to "remove 35 pounds of fluid."   ED Course: Supposed to be a fistula today but Na++ 122 so anesthesia declined surgery.    Review of Systems: As per HPI; otherwise review of systems reviewed and negative.     Past Medical History:  Diagnosis Date   AKI (acute kidney injury) ([place]) 02/2020   stage 4   CHF (congestive heart failure) (HCC)    Diabetes mellitus without complication (HCC)    diet controlled   Dyspnea    Hypertension     Past Surgical History:  Procedure Laterality Date   WISDOM TOOTH EXTRACTION      Social History   Socioeconomic History   Marital status: Married    Spouse name: Not on file   Number of children: Not on file   Years of education: Not  on file   Highest education level: Not on file  Occupational History   Not on file  Tobacco Use   Smoking status: Never   Smokeless tobacco: Former    Types: Chew    Quit date: 2021  Vaping Use   Vaping Use: Never used  Substance and Sexual Activity   Alcohol use: Not Currently   Drug use: Never   Sexual activity: Not on file  Other Topics Concern   Not on file  Social History Narrative   Not on file   Social Determinants of Health   Financial Resource Strain: Not on file  Food Insecurity: Not on file  Transportation Needs: Not on file  Physical Activity: Not on file  Stress: Not on file  Social Connections: Not on file  Intimate Partner Violence: Not on file    Allergies  Allergen Reactions   Grass Pollen(K-O-R-T-Swt Vern) Other (See Comments)    Eyes run, shortness of breath, itching     Family History  Problem Relation Age of Onset   Stroke Mother    Stroke Father     Prior to Admission medications   Medication Sig Start Date End Date Taking? Authorizing Provider  albuterol (VENTOLIN HFA) 108 (90 Base) MCG/ACT inhaler Can inhale two puffs every four to six hours as needed for cough, wheeze, shortness of breath, or chest tightness. 03/26/20  Yes Kozlow, Donnamarie Poag, MD  budesonide-formoterol [facility]) 160-4.5 MCG/ACT inhaler Inhale 2 puffs into the lungs 2 (two) times daily. Rinse, gargle and spit after use. Patient taking differently: Inhale 2 puffs into the lungs 2 (two) times daily as needed (respiratory issues). Rinse, gargle and spit after use. 06/24/20  Yes Padgett, Rae Halsted, MD  cholecalciferol (VITAMIN D) 25 MCG (1000 UNIT) tablet Take 1,000 Units by mouth daily.   Yes [provider]  folic acid-pyridoxine-cyancobalamin (NIVA-FOL) 2.5-25-2 MG TABS tablet Take 1 tablet by mouth in the morning.   Yes [provider]  hydrALAZINE (APRESOLINE) 10 MG tablet Take 20 mg by mouth every 8 (eight) hours. 06/30/21  Yes [provider]   metolazone (ZAROXOLYN) 5 MG tablet Take 5 mg by mouth every 3 (three) days. 07/10/21  Yes [provider]  torsemide (DEMADEX) 20 MG tablet Take 80 mg by mouth 2 (two) times daily. 04/24/21  Yes [provider]  acetaminophen (TYLENOL) 325 MG tablet Take 2 tablets (650 mg total) by mouth every 6 (six) hours as needed for mild pain (or Fever >/= 101). Patient not taking: Reported on 08/03/2021 04/01/21   Raiford Noble Latif, DO  furosemide (LASIX) 80 MG tablet Take 1 tablet (80 mg total) by mouth 2 (two) times daily for 3 days, THEN 1 tablet (80 mg total) daily. Patient not taking: Reported on 08/03/2021 04/01/21 05/04/21  Raiford Noble Latif, DO  hydrocortisone cream 0.5 % Apply topically 2 (two) times daily. Patient taking differently: Apply 1 application topically 2 (two) times daily as needed for itching. 04/01/21   Raiford Noble Latif, DO  ipratropium (ATROVENT) 0.06 % nasal spray INSTILL 2 SPRAY INTO BOTH NOSTRILS TWICEDAILY Patient not taking: Reported on 08/03/2021 11/04/20   Kennith Gain, MD  ondansetron (ZOFRAN) 4 MG tablet Take 1 tablet (4 mg total) by mouth every 6 (six) hours as needed for nausea. Patient not taking: Reported on 08/03/2021 04/01/21   Raiford Noble Latif, DO  sodium bicarbonate 650 MG tablet Take 2 tablets (1,300 mg total) by mouth 3 (three) times daily. Patient not taking: Reported on 08/03/2021 04/01/21   Kerney Elbe, DO    Physical Exam: Vitals:   08/03/21 0843 08/04/21 0632  BP:  138/71  Pulse:  68  Resp:  18  Temp:  97.7 F (36.5 C)  TempSrc:  Oral  SpO2:  99%  Weight: 83.9 kg 83.9 kg  Height: 6' (1.829 m) 6' (1.829 m)     General:  Appears calm and comfortable and is in NAD, somewhat agitated Eyes:  PERRL, EOMI, normal lids, iris ENT:  grossly normal hearing, lips & tongue, mmm; appropriate dentition Neck:  no LAD, masses or thyromegaly Cardiovascular:  RRR, no r/g, 3/6 systolic murmur. 2+ LE edema.  Respiratory:    CTA bilaterally with no wheezes/rales/rhonchi.  Normal respiratory effort. Abdomen:  taut with significant ascites and fluid wave  Skin:  no rash or induration seen on limited exam Musculoskeletal:  grossly normal tone BUE/BLE, good ROM, no bony abnormality Psychiatric:  anxious mood and affect, speech fluent and appropriate, AOx3 Neurologic:  CN 2-12 grossly intact, moves all extremities in coordinated fashion    Radiological Exams on Admission: Independently reviewed - see discussion in A/P where applicable  No results found.  EKG: not done   Labs on Admission: I have personally reviewed the available labs and imaging studies at the time of the admission.  Pertinent labs:   ISTAT: Na++ 122 K+ 3.0 BUN  82/Creatinine 5.70   Assessment/Plan Active Problems:   Hyponatremia   CKD (chronic kidney disease) stage 5, GFR 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 (HCC)   Chronic systolic CHF (congestive heart failure) (HCC)   Diabetes mellitus type 2 in nonobese (HCC)   Hyponatremia -He was hospitalized for symptomatic hyponatremia with Na++ 117 in July; he is currently asymptomatic -This appears to be chronic in nature and not significantly different from his baseline -He has been excessively diuresed recently and this is likely related, but he also has a complicated fluid balance picture related to his liver disease + ESRD + severe systolic CHF -He is clearly third spacing  -He is due for paracentesis on 12/1 and this may improve his overall fluid balance -He is closely followed by Dr. Royce Macadamia -He does not appear to need admission at this time -I have ordered a CMP, as the ISTAT Na++ may be less accurate -His volume status is expected to improve post-paracentesis and this may lead to less dependence on diuretics - which may improve his Na++  Stage 5 CKD -Patient was scheduled for permanent HD access -He has not yet started HD -He may ultimately require TDC placement to initiate HD if his Na++ does  not stabilize to the point of being able to safely do procedure under general anesthesia -Would recommend lab-run BMP/renal panel prior to future attempts  Ascites -For paracentesis on Thursday, 12/1 -This issue further complicates his volume status -He does not appear to be overly jaundiced currently -Prior bili was in the 3 range -CMP ordered, as noted  Chronic systolic CHF -12/12/66 echo with EF 20-25% -This further complicates his volume status -He may benefit from palliative care consultation given his multisystem failure (chronic) -Ongoing diuretics will be needed until he can have volume adjusted via HD  DM -Diet controlled -A1c was 6.8 in 02/2020    Based on overall clinical stability, the patient appears to be appropriate for d/c.  I recommend the following: 1- CMP today, if possible 2- Paracentesis Thursday, as scheduled 3- Adjustment of diuretics, as clinical situation permits 4- Lab-drawn BMP with future permanent access attempt 5- TDC if HD initiation is needed prior to Na++ stabilization 6- Close outpatient nephrology f/u for ongoing monitoring (he has been going 1-2 times a week)      Karmen Bongo MD [facility]   How to contact the [facility] Attending or Consulting provider [place] or covering provider during after hours [place], for this patient?  Check the care team in [facility] and look for a) attending/consulting [facility] provider listed and b) the [facility] team listed Log into [email or web address] and use [place]'s universal password to access. If you do not have the password, please contact the hospital operator. Locate the [facility] provider you are looking for under [facility] and page to a number that you can be directly reached. If you still have difficulty reaching the provider, please page the [place] (Director on Call) for the Hospitalists listed on amion for assistance.   08/04/2021, 8:56 AM

## 2021-08-04 NOTE — Progress Notes (Signed)
Patient's IV removed.  Site clean, dry, and intact.  Patient verbalized that he is ready to be discharged but awaiting note from Dr. Lorin Mercy.   Dr. Lorin Mercy is aware and stated that note is almost complete and patient can be discharged.  Patient verbalizes understanding to follow-up with nephrologist.    CMP collected prior to discharge.    Brother, Quita Skye, at bedside.

## 2021-08-04 NOTE — H&P (Signed)
Office Note   Pt seen and examined this morning. Case cancelled due to hyponatremia 122.  Will discuss with hospital medicine.   CC:  ESRD Requesting Provider:  No ref. provider found  HPI: Benjamin [name] is a Right handed 65 y.o. (03-Jun-1956) male with kidney disease who presents at the request of No ref. provider found for permanent HD access. The patient has had no prior access procedures. He is not currently on dialysis. His kidney failure is from diabetes mellitus and hypertension.  So has heart failure, pulmonary hypertension, hepatic congestion.  Patient presented to the office today in a wheelchair accompanied by his son.  We discussed the role of a fistula in regards to dialysis.  He is aware that while his kidneys are still functioning, this may not last much longer.  The pt is not on a statin for cholesterol management.  The pt is not on a daily aspirin.   Other AC:  - The pt is  on medications for hypertension.   The pt is  diabetic. Tobacco hx:  smokeless  Past Medical History:  Diagnosis Date   AKI (acute kidney injury) ([place]) 02/2020   stage 4   CHF (congestive heart failure) (HCC)    Diabetes mellitus without complication (HCC)    diet controlled   Dyspnea    Hypertension     Past Surgical History:  Procedure Laterality Date   WISDOM TOOTH EXTRACTION      Social History   Socioeconomic History   Marital status: Married    Spouse name: Not on file   Number of children: Not on file   Years of education: Not on file   Highest education level: Not on file  Occupational History   Not on file  Tobacco Use   Smoking status: Never   Smokeless tobacco: Former    Types: Chew    Quit date: 2021  Vaping Use   Vaping Use: Never used  Substance and Sexual Activity   Alcohol use: Not Currently   Drug use: Never   Sexual activity: Not on file  Other Topics Concern   Not on file  Social History Narrative   Not on file   Social Determinants of Health    Financial Resource Strain: Not on file  Food Insecurity: Not on file  Transportation Needs: Not on file  Physical Activity: Not on file  Stress: Not on file  Social Connections: Not on file  Intimate Partner Violence: Not on file    Family History  Problem Relation Age of Onset   Stroke Mother    Stroke Father     Current Facility-Administered Medications  Medication Dose Route Frequency Provider Last Rate Last Admin   lactated ringers infusion   Intravenous Continuous Oleta Mouse, MD        Allergies  Allergen Reactions   Grass Pollen(K-O-R-T-Swt Vern) Other (See Comments)    Eyes run, shortness of breath, itching      REVIEW OF SYSTEMS:   [X]  denotes positive finding, [ ]  denotes negative finding Cardiac  Comments:  Chest pain or chest pressure:    Shortness of breath upon exertion:    Short of breath when lying flat:    Irregular heart rhythm:        Vascular    Pain in calf, thigh, or hip brought on by ambulation:    Pain in feet at night that wakes you up from your sleep:     Blood clot in your veins:  Leg swelling:         Pulmonary    Oxygen at home:    Productive cough:     Wheezing:         Neurologic    Sudden weakness in arms or legs:     Sudden numbness in arms or legs:     Sudden onset of difficulty speaking or slurred speech:    Temporary loss of vision in one eye:     Problems with dizziness:         Gastrointestinal    Blood in stool:     Vomited blood:         Genitourinary    Burning when urinating:     Blood in urine:        Psychiatric    Major depression:         Hematologic    Bleeding problems:    Problems with blood clotting too easily:        Skin    Rashes or ulcers:        Constitutional    Fever or chills:      PHYSICAL EXAMINATION:  Vitals:   08/03/21 0843 08/04/21 0632  BP:  138/71  Pulse:  68  Resp:  18  Temp:  97.7 F (36.5 C)  TempSrc:  Oral  SpO2:  99%  Weight: 83.9 kg 83.9 kg   Height: 6' (1.829 m) 6' (1.829 m)    General:  WDWN in NAD; vital signs documented above Gait: Not observed HENT: WNL, normocephalic Pulmonary: normal non-labored breathing , without Rales, rhonchi,  wheezing Cardiac: regular HR,  Abdomen: soft, NT, no masses Skin: with rashes on bilateral arms Vascular Exam/Pulses:  Right Left  Radial 2+ (normal) 2+ (normal)  Ulnar 2+ (normal) 2+ (normal)                   Extremities: without ischemic changes, without Gangrene , without cellulitis; without open wounds;  Musculoskeletal: no muscle wasting or atrophy  Neurologic: A&O X 3;  No focal weakness or paresthesias are detected Psychiatric:  The pt has Normal affect.   Non-Invasive Vascular Imaging:   Noninvasive vascular imaging was reviewed demonstrating adequate right-sided cephalic, left-sided cephalic and basilic veins for dialysis access Arteries are of adequate size    ASSESSMENT/PLAN:  Benjamin [name] is a 65 y.o. male who presents with chronic kidney disease stage 4  Based on vein mapping and examination, he would be best suited with left-sided brachiocephalic fistula.. I had an extensive discussion with this patient in regards to the nature of access surgery, including risk, benefits, and alternatives.   The patient is aware that the risks of access surgery include but are not limited to: bleeding, infection, steal syndrome, nerve damage, ischemic monomelic neuropathy, failure of access to mature, complications related to venous hypertension, and possible need for additional access procedures in the future. The patient has agreed to proceed with the above procedure which will be scheduled .  [name], MD [facility] [phone]

## 2021-08-06 ENCOUNTER — Ambulatory Visit (HOSPITAL_COMMUNITY)
Admission: RE | Admit: 2021-08-06 | Discharge: 2021-08-06 | Disposition: A | Discharge status: Home / Self Care | Payer: BC Managed Care – PPO | Source: Ambulatory Visit | Attending: Nephrology | Admitting: Nephrology

## 2021-08-06 ENCOUNTER — Other Ambulatory Visit (HOSPITAL_COMMUNITY): Payer: BC Managed Care – PPO

## 2021-08-06 ENCOUNTER — Other Ambulatory Visit: Payer: Self-pay

## 2021-08-06 DIAGNOSIS — R188 Other ascites: Secondary | ICD-10-CM | POA: Insufficient documentation

## 2021-08-06 MED ORDER — LIDOCAINE HCL 1 % IJ SOLN
INTRAMUSCULAR | Status: AC
  Administered 2021-08-06: 10 mL
  Filled 2021-08-06: qty 20

## 2021-08-06 NOTE — Procedures (Signed)
PROCEDURE SUMMARY:  Successful US guided therapeutic paracentesis from LLQ.  Yielded 4.7 L of clear, amber fluid.  No immediate complications.  Pt tolerated well.   Specimen not sent for labs.  EBL < 1 mL  Tyson Alias, AGNP 08/06/2021 3:28 PM

## 2021-08-07 DIAGNOSIS — N184 Chronic kidney disease, stage 4 (severe): Secondary | ICD-10-CM | POA: Diagnosis not present

## 2021-08-09 DIAGNOSIS — D689 Coagulation defect, unspecified: Secondary | ICD-10-CM | POA: Insufficient documentation

## 2021-08-09 DIAGNOSIS — T782XXA Anaphylactic shock, unspecified, initial encounter: Secondary | ICD-10-CM | POA: Insufficient documentation

## 2021-08-09 DIAGNOSIS — N2581 Secondary hyperparathyroidism of renal origin: Secondary | ICD-10-CM | POA: Insufficient documentation

## 2021-08-09 DIAGNOSIS — E1122 Type 2 diabetes mellitus with diabetic chronic kidney disease: Secondary | ICD-10-CM | POA: Insufficient documentation

## 2021-08-09 DIAGNOSIS — T7840XA Allergy, unspecified, initial encounter: Secondary | ICD-10-CM | POA: Insufficient documentation

## 2021-08-09 DIAGNOSIS — R0602 Shortness of breath: Secondary | ICD-10-CM | POA: Insufficient documentation

## 2021-08-10 ENCOUNTER — Encounter (HOSPITAL_COMMUNITY): Payer: Self-pay | Admitting: Vascular Surgery

## 2021-08-10 DIAGNOSIS — N2581 Secondary hyperparathyroidism of renal origin: Secondary | ICD-10-CM | POA: Diagnosis not present

## 2021-08-10 DIAGNOSIS — Z880 Allergy status to penicillin: Secondary | ICD-10-CM | POA: Insufficient documentation

## 2021-08-10 DIAGNOSIS — Z992 Dependence on renal dialysis: Secondary | ICD-10-CM | POA: Diagnosis not present

## 2021-08-10 DIAGNOSIS — N186 End stage renal disease: Secondary | ICD-10-CM | POA: Diagnosis not present

## 2021-08-10 NOTE — Anesthesia Preprocedure Evaluation (Addendum)
Anesthesia Evaluation  Patient identified by MRN, date of birth, ID band Patient awake    Reviewed: Allergy & Precautions, H&P , NPO status , Patient's Chart, lab work & pertinent test results  Airway Mallampati: II   Neck ROM: full    Dental   Pulmonary shortness of breath,    breath sounds clear to auscultation       Cardiovascular hypertension, +CHF   Rhythm:regular Rate:Normal  Echo 04/18/21 ([facility]): SUMMARY  There is mild concentric left ventricular hypertrophy.  Left ventricular systolic function is severely reduced.  There is severe global hypokinesis of the left ventricle.  LV ejection fraction = 20-25%.  The right ventricle is moderate to severely dilated.  The right atrium is severely dilated.  Diffuse thickening of the aortic valve with restricted cusp opening.  Aortic valve mean pressure gradient is 9 mmHg.  There is no aortic stenosis.  There is mild aortic regurgitation.  There is mild mitral regurgitation.  There is moderate tricuspid regurgitation.  Estimated right ventricular systolic pressure is 76 mmHg.  Severe pulmonary hypertension.  There is no comparison study available.     Neuro/Psych    GI/Hepatic   Endo/Other  diabetes, Type 2  Renal/GU ESRFRenal disease     Musculoskeletal   Abdominal   Peds  Hematology  (+) Blood dyscrasia, anemia ,   Anesthesia Other Findings   Reproductive/Obstetrics                            Anesthesia Physical Anesthesia Plan  ASA: 3  Anesthesia Plan: MAC and Regional   Post-op Pain Management:    Induction: Intravenous  PONV Risk Score and Plan: 1 and Propofol infusion, Ondansetron, Midazolam and Treatment may vary due to age or medical condition  Airway Management Planned: Simple Face Mask  Additional Equipment:   Intra-op Plan:   Post-operative Plan:   Informed Consent: I have reviewed the patients History  and Physical, chart, labs and discussed the procedure including the risks, benefits and alternatives for the proposed anesthesia with the patient or authorized representative who has indicated his/her understanding and acceptance.     Dental advisory given  Plan Discussed with: CRNA, Surgeon and Anesthesiologist  Anesthesia Plan Comments: (See PAT note written 08/10/2021 by Myra Gianotti, PA-C. CKD stage IV-V with cardiorenal issues and ascites, s/p paracentesis 08/06/21. Admitted for hyponatremia July & August 2022. 08/04/21 surgery cancelled due to hyponatremia (Na 122), and had hospitalist consult prior to leaving [place].   )       Anesthesia Quick Evaluation

## 2021-08-10 NOTE — Progress Notes (Signed)
Anesthesia Follow-up: Benjamin [name]  Case: [id number] Date/Time: 08/11/21 0715   Procedure: LEFT ARM ARTERIOVENOUS (AV) FISTULA CREATION (Left)   Anesthesia type: Regional   Pre-op diagnosis: ESRD   Location: [facility] OR ROOM 11 / [place] [facility]   Surgeons: [name], MD       DISCUSSION: Patient is a 65 year old male scheduled for the above procedure. Surgery postponed from 07/14/21 to 08/04/21 due to acute on chronic HF in settin of worsening CKD with weight up ~ 35-40 lb (declined admission & diuretic therapy adjusted). Surgery was then cancelled on 08/04/21 due to hyponatremia (Na 122). He had a Hospitalist consult by Karmen Bongo, MD on 08/04/21 prior to leaving [facility]. Patient with hyponatremia since the summer (see below regarding admissions), asymptomatic on 08/04/21 so did not think he would require admission (reportedly would refuse anyway). He was on high-dose diuretics (Lasix 120 mg daily plus metolazone BID) for cardiorenal impairments and ascites and had scheduled paracentesis on 08/06/21. Dr. Lorin Mercy felt paracentesis would improve his overall fluid balance which may lead to less dependence on diuretics which may improve his sodium. Plan for paracentesis as scheduled with on-going close follow-up with nephrologist Dr. Royce Macadamia. She added, "He may ultimately require TDC placement to initiate HD if his Na++ does not stabilize to the point of being able to safely do procedure under general anesthesia". 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 He underwent therapeutic paracentesis 08/06/21 removing 4.7 L of clear, amber fluid.  Other history includes never smoker, HTN, DM2 (diet controlled), HFrEF, dyspnea, CKD (stage IV-V).    - [facility] admission 04/17/21-04/24/21 for symptomatic hyponatremia at 107 and also diagnosed with new cardiomyopathy.  He had poor intake  with fall and slurred speech at home. Admitted to ICU, started on 3% NS. Given IV Lasix. Echo showed mild LVH, severely reduced LVF with EF 20-25%, global hypokinesis, moderate-severely dilated RV, severely dilated RA, mild AR/MR, moderate TR, severe pulmonary hypertension RVSP 76. Nephrology and cardiology consulted. Cardiomyopathy felt likely secondary to prior heavy alcohol drinking, as well as DM, HTN. Continue diuresis. Consider RHC/LHC in the future, but on hold due to worsening CKD. Na improved to 123 by discharge.   - [facility] admission 03/26/21-04/01/21 for hyponatremia (Na 117) in setting of CKD stage V. Notes also indicate history of consuming a significant quantity of water daily and prior heavy EtOH use.  Hyponatremia treatment included NS infusion, tolvaptan, Lasix (for volume overload). Discharged with close nephrology follow-up.   Last cardiology visit with Dr. Francene Castle was on 07/23/21.  Patient had further weight gain and LE/scrotal edema. Nephrologist recently escalated his torsemide to 80 mg BID for a few extra due to increased edema. He gave metolazone 5 mg, but felt "he likely has bowel edema and will not respond to oral medications. I reviewed this with the patient and his 2 sons. I recommended a direct admit which he is not interested in today but is willing to consider tomorrow. He has been in the ER twice and left twice, so direct admit would be preferable. Needs to stop salt." Two month follow-up planned.    He is for updated labs on arrival. Anesthesia team to evaluate on the day of surgery. Requested last office note and labs from Dr. Royce Macadamia, but records are pending.   VS: There were no vitals taken for this visit. Wt Readings  from Last 3 Encounters:  08/04/21 83.9 kg  07/03/21 83.9 kg  04/01/21 91 kg   BP Readings from Last 3 Encounters:  08/06/21 122/78  08/04/21 138/71  07/03/21 128/82   Pulse Readings from Last 3 Encounters:  08/06/21 (!) 57  08/04/21 68   07/03/21 61     PROVIDERS: Mateo Flow, MD is PCP  Alyssa Grove, MD is cardiologist Harrie Jeans, MD is nephrologist   LABS: Labs on arrival for surgery. Currently last results include: Lab Results  Component Value Date   WBC 4.0 04/01/2021   HGB 10.9 (L) 08/04/2021   HCT 32.0 (L) 08/04/2021   PLT 143 (L) 04/01/2021   GLUCOSE 85 08/04/2021   ALT 19 08/04/2021   AST 44 (H) 08/04/2021   NA 124 (L) 08/04/2021   K 2.8 (L) 08/04/2021   CL 81 (L) 08/04/2021   CREATININE 5.19 (H) 08/04/2021   BUN 79 (H) 08/04/2021   CO2 28 08/04/2021   TSH 3.258 03/26/2021      Spirometry 03/18/20: Post FVC 3.04 ((67%), FEV1 2.01 (59%). Pre interpretation: Moderate restriction. Mild airways obstruction. Post Interpretation: Mild restriction.  Mild airways obstruction.  Slight improvement postbronchodilator.     IMAGES: CXR 04/22/21 ([facility]): FINDINGS:  1004 hours. The cardio pericardial silhouette is enlarged. There is  pulmonary vascular congestion without overt pulmonary edema. Diffuse  interstitial opacity noted, potentially related to edema. Old  right-sided rib fractures evident. Telemetry leads overlie the  chest.  IMPRESSION:  Cardiomegaly with vascular congestion and probable interstitial  edema.    CT Head 04/17/21 ([facility]): IMPRESSION:  - No acute or focal intracranial finding. Mild atrophy with chronic  small-vessel ischemic change of the white matter.  - Extensive vascular calcification.  - Mild soft tissue swelling of the soft tissues of the right scalp.     CT abd/pelvis 04/17/21 ([facility]): IMPRESSION:  - No acute finding.  - Cardiomegaly. Coronary artery calcification. Aortic atherosclerotic  calcification. Extensive small vessel calcification consistent with  chronic diabetes.  - No evidence of acute or significant organ pathology. There may be a  few small nonobstructing renal calculi, but no evidence of passing  stone.      EKG:  7/221/22: Accelerated junctional rhythm vs sinus with very long pr Ventricular premature complex Probable left ventricular hypertrophy Prolonged QT interval Confirmed by Davonna Belling [phone]) on 03/27/2021 6:12:40 PM     CV: Echo 04/18/21 ([facility]): SUMMARY  There is mild concentric left ventricular hypertrophy.  Left ventricular systolic function is severely reduced.  There is severe global hypokinesis of the left ventricle.  LV ejection fraction = 20-25%.  The right ventricle is moderate to severely dilated.  The right atrium is severely dilated.  Diffuse thickening of the aortic valve with restricted cusp opening.  Aortic valve mean pressure gradient is 9 mmHg.  There is no aortic stenosis.  There is mild aortic regurgitation.  There is mild mitral regurgitation.  There is moderate tricuspid regurgitation.  Estimated right ventricular systolic pressure is 76 mmHg.  Severe pulmonary hypertension.  There is no comparison study available.      Past Medical History:  Diagnosis Date   AKI (acute kidney injury) ([place]) 02/2020   stage 4   CHF (congestive heart failure) ([facility])    Diabetes mellitus without complication ([place])    diet controlled   Dyspnea    Hypertension     Past Surgical History:  Procedure Laterality Date   WISDOM TOOTH EXTRACTION  MEDICATIONS: No current facility-administered medications for this encounter.    acetaminophen (TYLENOL) 325 MG tablet   albuterol (VENTOLIN HFA) 108 (90 Base) MCG/ACT inhaler   budesonide-formoterol (SYMBICORT) 160-4.5 MCG/ACT inhaler   cholecalciferol (VITAMIN D) 25 MCG (1000 UNIT) tablet   folic acid-pyridoxine-cyancobalamin (NIVA-FOL) 2.5-25-2 MG TABS tablet   furosemide (LASIX) 80 MG tablet   hydrALAZINE (APRESOLINE) 10 MG tablet   hydrocortisone cream 0.5 %   ipratropium (ATROVENT) 0.06 % nasal spray   metolazone (ZAROXOLYN) 5 MG tablet   ondansetron (ZOFRAN) 4 MG tablet   sodium bicarbonate 650 MG tablet    torsemide (DEMADEX) 20 MG tablet    Myra Gianotti, PA-C Surgical Short Stay/Anesthesiology [facility] Phone [phone] [facility] Phone [phone] 08/10/2021 10:40 AM

## 2021-08-10 NOTE — Progress Notes (Signed)
Mr. Benjamin [name] denies chest pain or shortness of    breath. Patient denies having any s/s of Covid in his household.  Patient denies any known exposure to Covid.   Mr. Benjamin [name] has type II diabetes- diet controlled.  PCP is Dr. Verlan Friends, cardiologist is Dr. Daryll Brod.  I instructed Mr. Benjamin [name] to wash up well with antibiotic soap, if it is available.  Dry off with a clean towel. Do not put lotion, powder, cologne or deodorant or makeup.No jewelry or piercings. Men may shave their face and neck. Woman should not shave. No nail polish, artificial or acrylic nails. Wear clean clothes, brush your teeth. Glasses, contact lens,dentures or partials may not be worn in the OR. If you need to wear them, please bring a case for glasses, do not wear contacts or bring a case, the hospital does not have contact cases, dentures or partials will have to be removed , make sure they are clean, we will provide a denture cup to put them in. You will need some one to drive you home and a responsible person over the age of 65 to stay with you for the first 24 hours after surgery.

## 2021-08-11 ENCOUNTER — Ambulatory Visit (HOSPITAL_COMMUNITY)
Admission: RE | Admit: 2021-08-11 | Discharge: 2021-08-11 | Disposition: A | Discharge status: Home / Self Care | Payer: BC Managed Care – PPO | Attending: Vascular Surgery | Admitting: Vascular Surgery

## 2021-08-11 ENCOUNTER — Encounter (HOSPITAL_COMMUNITY): Payer: BC Managed Care – PPO

## 2021-08-11 ENCOUNTER — Encounter (HOSPITAL_COMMUNITY)
Admission: RE | Disposition: A | Discharge status: Home / Self Care | Payer: Self-pay | Source: Home / Self Care | Attending: Vascular Surgery

## 2021-08-11 ENCOUNTER — Other Ambulatory Visit: Payer: Self-pay

## 2021-08-11 ENCOUNTER — Ambulatory Visit (HOSPITAL_COMMUNITY): Payer: BC Managed Care – PPO | Admitting: Vascular Surgery

## 2021-08-11 ENCOUNTER — Encounter (HOSPITAL_COMMUNITY): Payer: Self-pay | Admitting: Vascular Surgery

## 2021-08-11 DIAGNOSIS — N186 End stage renal disease: Secondary | ICD-10-CM | POA: Insufficient documentation

## 2021-08-11 DIAGNOSIS — E1122 Type 2 diabetes mellitus with diabetic chronic kidney disease: Secondary | ICD-10-CM | POA: Insufficient documentation

## 2021-08-11 DIAGNOSIS — N184 Chronic kidney disease, stage 4 (severe): Secondary | ICD-10-CM | POA: Diagnosis not present

## 2021-08-11 DIAGNOSIS — I132 Hypertensive heart and chronic kidney disease with heart failure and with stage 5 chronic kidney disease, or end stage renal disease: Secondary | ICD-10-CM | POA: Insufficient documentation

## 2021-08-11 DIAGNOSIS — N185 Chronic kidney disease, stage 5: Secondary | ICD-10-CM

## 2021-08-11 DIAGNOSIS — N179 Acute kidney failure, unspecified: Secondary | ICD-10-CM | POA: Diagnosis not present

## 2021-08-11 DIAGNOSIS — I272 Pulmonary hypertension, unspecified: Secondary | ICD-10-CM | POA: Diagnosis not present

## 2021-08-11 DIAGNOSIS — I509 Heart failure, unspecified: Secondary | ICD-10-CM | POA: Insufficient documentation

## 2021-08-11 DIAGNOSIS — I5022 Chronic systolic (congestive) heart failure: Secondary | ICD-10-CM | POA: Insufficient documentation

## 2021-08-11 HISTORY — PX: AV FISTULA PLACEMENT: SHX1204

## 2021-08-11 LAB — URINALYSIS, MICROSCOPIC (REFLEX)
Squamous Epithelial / HPF: NONE SEEN (ref 0–5)
WBC, UA: >50 WBC/hpf (ref 0–5)

## 2021-08-11 LAB — URINALYSIS, ROUTINE W REFLEX MICROSCOPIC
Bilirubin Urine: NEGATIVE
Glucose, UA: NEGATIVE mg/dL
Ketones, ur: NEGATIVE mg/dL
Nitrite: NEGATIVE
Protein, ur: 100 mg/dL — AB
Specific Gravity, Urine: 1.015 (ref 1.005–1.030)
pH: 6.5 (ref 5.0–8.0)

## 2021-08-11 LAB — POCT I-STAT, CHEM 8
BUN: 44 mg/dL — ABNORMAL HIGH (ref 8–23)
Calcium, Ion: 1.07 mmol/L — ABNORMAL LOW (ref 1.15–1.40)
Chloride: 90 mmol/L — ABNORMAL LOW (ref 98–111)
Creatinine, Ser: 4.2 mg/dL — ABNORMAL HIGH (ref 0.61–1.24)
Glucose, Bld: 79 mg/dL (ref 70–99)
HCT: 31 % — ABNORMAL LOW (ref 39.0–52.0)
Hemoglobin: 10.5 g/dL — ABNORMAL LOW (ref 13.0–17.0)
Potassium: 3.7 mmol/L (ref 3.5–5.1)
Sodium: 130 mmol/L — ABNORMAL LOW (ref 135–145)
TCO2: 28 mmol/L (ref 22–32)

## 2021-08-11 LAB — SURGICAL PCR SCREEN
MRSA, PCR: POSITIVE — AB
Staphylococcus aureus: POSITIVE — AB

## 2021-08-11 LAB — GLUCOSE, CAPILLARY: Glucose-Capillary: 88 mg/dL (ref 70–99)

## 2021-08-11 SURGERY — ARTERIOVENOUS (AV) FISTULA CREATION
Anesthesia: Monitor Anesthesia Care | Laterality: Left

## 2021-08-11 MED ORDER — MIDAZOLAM HCL 2 MG/2ML IJ SOLN
INTRAMUSCULAR | Status: AC
  Filled 2021-08-11: qty 2

## 2021-08-11 MED ORDER — CHLORHEXIDINE GLUCONATE 4 % EX LIQD: 60.0000 mL | Freq: Once | CUTANEOUS | Status: DC

## 2021-08-11 MED ORDER — HEPARIN 6000 UNIT IRRIGATION SOLUTION
Status: DC | PRN
  Administered 2021-08-11: 1

## 2021-08-11 MED ORDER — CHLORHEXIDINE GLUCONATE 0.12 % MT SOLN
15.0000 mL | Freq: Once | OROMUCOSAL | Status: AC
  Administered 2021-08-11: 15 mL via OROMUCOSAL

## 2021-08-11 MED ORDER — MIDAZOLAM HCL 2 MG/2ML IJ SOLN
INTRAMUSCULAR | Status: DC | PRN
  Administered 2021-08-11: 2 mg via INTRAVENOUS

## 2021-08-11 MED ORDER — PROPOFOL 1000 MG/100ML IV EMUL
INTRAVENOUS | Status: AC
  Filled 2021-08-11: qty 300

## 2021-08-11 MED ORDER — FENTANYL CITRATE (PF) 250 MCG/5ML IJ SOLN
INTRAMUSCULAR | Status: AC
  Filled 2021-08-11: qty 5

## 2021-08-11 MED ORDER — VASOPRESSIN 20 UNIT/ML IV SOLN
INTRAVENOUS | Status: AC
  Filled 2021-08-11: qty 1

## 2021-08-11 MED ORDER — ONDANSETRON HCL 4 MG/2ML IJ SOLN
INTRAMUSCULAR | Status: AC
  Filled 2021-08-11: qty 2

## 2021-08-11 MED ORDER — SODIUM CHLORIDE 0.9 % IV SOLN: INTRAVENOUS | Status: DC

## 2021-08-11 MED ORDER — FENTANYL CITRATE (PF) 100 MCG/2ML IJ SOLN: 25.0000 ug | INTRAMUSCULAR | Status: DC | PRN

## 2021-08-11 MED ORDER — LIDOCAINE HCL (PF) 1 % IJ SOLN
INTRAMUSCULAR | Status: AC
  Filled 2021-08-11: qty 30

## 2021-08-11 MED ORDER — OXYCODONE HCL 5 MG/5ML PO SOLN: 5.0000 mg | Freq: Once | ORAL | Status: DC | PRN

## 2021-08-11 MED ORDER — LIDOCAINE 2% (20 MG/ML) 5 ML SYRINGE
INTRAMUSCULAR | Status: DC | PRN
  Administered 2021-08-11: 20 mg via INTRAVENOUS

## 2021-08-11 MED ORDER — TRAMADOL HCL 50 MG PO TABS: 50.0000 mg | ORAL_TABLET | Freq: Four times a day (QID) | ORAL | 0 refills | Status: DC | PRN

## 2021-08-11 MED ORDER — EPHEDRINE SULFATE-NACL 50-0.9 MG/10ML-% IV SOSY
PREFILLED_SYRINGE | INTRAVENOUS | Status: DC | PRN
  Administered 2021-08-11: 10 mg via INTRAVENOUS
  Administered 2021-08-11: 15 mg via INTRAVENOUS

## 2021-08-11 MED ORDER — 0.9 % SODIUM CHLORIDE (POUR BTL) OPTIME
TOPICAL | Status: DC | PRN
  Administered 2021-08-11: 1000 mL

## 2021-08-11 MED ORDER — PROPOFOL 1000 MG/100ML IV EMUL
INTRAVENOUS | Status: AC
  Filled 2021-08-11: qty 100

## 2021-08-11 MED ORDER — EPHEDRINE 5 MG/ML INJ
INTRAVENOUS | Status: AC
  Filled 2021-08-11: qty 5

## 2021-08-11 MED ORDER — CHLORHEXIDINE GLUCONATE 0.12 % MT SOLN
OROMUCOSAL | Status: AC
  Filled 2021-08-11: qty 15

## 2021-08-11 MED ORDER — HEPARIN 6000 UNIT IRRIGATION SOLUTION
Status: AC
  Filled 2021-08-11: qty 500

## 2021-08-11 MED ORDER — LIDOCAINE 2% (20 MG/ML) 5 ML SYRINGE
INTRAMUSCULAR | Status: AC
  Filled 2021-08-11: qty 5

## 2021-08-11 MED ORDER — CEFAZOLIN SODIUM-DEXTROSE 2-4 GM/100ML-% IV SOLN
INTRAVENOUS | Status: AC
  Filled 2021-08-11: qty 100

## 2021-08-11 MED ORDER — LIDOCAINE-EPINEPHRINE 2 %-1:100000 IJ SOLN
INTRAMUSCULAR | Status: DC | PRN
  Administered 2021-08-11: 20 mL via PERINEURAL

## 2021-08-11 MED ORDER — LIDOCAINE HCL 1 % IJ SOLN
INTRAMUSCULAR | Status: DC | PRN
  Administered 2021-08-11: 3 mL

## 2021-08-11 MED ORDER — CEFAZOLIN SODIUM-DEXTROSE 2-4 GM/100ML-% IV SOLN
2.0000 g | INTRAVENOUS | Status: AC
  Administered 2021-08-11: 2 g via INTRAVENOUS

## 2021-08-11 MED ORDER — DOUBLE ANTIBIOTIC 500-10000 UNIT/GM EX OINT
TOPICAL_OINTMENT | CUTANEOUS | Status: AC
  Filled 2021-08-11: qty 28.4

## 2021-08-11 MED ORDER — ONDANSETRON HCL 4 MG/2ML IJ SOLN: 4.0000 mg | Freq: Four times a day (QID) | INTRAMUSCULAR | Status: DC | PRN

## 2021-08-11 MED ORDER — ONDANSETRON HCL 4 MG/2ML IJ SOLN
INTRAMUSCULAR | Status: DC | PRN
  Administered 2021-08-11: 4 mg via INTRAVENOUS

## 2021-08-11 MED ORDER — PROPOFOL 10 MG/ML IV BOLUS
INTRAVENOUS | Status: DC | PRN
  Administered 2021-08-11: 25 ug/kg/min via INTRAVENOUS

## 2021-08-11 MED ORDER — HEPARIN SODIUM (PORCINE) 1000 UNIT/ML IJ SOLN
INTRAMUSCULAR | Status: AC
  Filled 2021-08-11: qty 1

## 2021-08-11 MED ORDER — OXYCODONE HCL 5 MG PO TABS: 5.0000 mg | ORAL_TABLET | Freq: Once | ORAL | Status: DC | PRN

## 2021-08-11 SURGICAL SUPPLY — 39 items
ARMBAND PINK RESTRICT EXTREMIT (MISCELLANEOUS) ×2 IMPLANT
BAG COUNTER SPONGE SURGICOUNT (BAG) ×2 IMPLANT
BLADE CLIPPER SURG (BLADE) ×2 IMPLANT
CANISTER SUCT 3000ML PPV (MISCELLANEOUS) ×2 IMPLANT
CLIP LIGATING EXTRA MED SLVR (CLIP) ×2 IMPLANT
CLIP LIGATING EXTRA SM BLUE (MISCELLANEOUS) ×2 IMPLANT
CLIP VESOCCLUDE MED 6/CT (CLIP) ×2 IMPLANT
COVER PROBE W GEL 5X96 (DRAPES) ×2 IMPLANT
DECANTER SPIKE VIAL GLASS SM (MISCELLANEOUS) ×2 IMPLANT
DERMABOND ADHESIVE PROPEN (GAUZE/BANDAGES/DRESSINGS) ×1
DERMABOND ADVANCED (GAUZE/BANDAGES/DRESSINGS) ×1
DERMABOND ADVANCED .7 DNX12 (GAUZE/BANDAGES/DRESSINGS) ×1 IMPLANT
DERMABOND ADVANCED .7 DNX6 (GAUZE/BANDAGES/DRESSINGS) ×1 IMPLANT
DRESSING MEPILEX FLEX 4X4 (GAUZE/BANDAGES/DRESSINGS) ×1 IMPLANT
DRSG MEPILEX FLEX 4X4 (GAUZE/BANDAGES/DRESSINGS) ×2
ELECT REM PT RETURN 9FT ADLT (ELECTROSURGICAL) ×2
ELECTRODE REM PT RTRN 9FT ADLT (ELECTROSURGICAL) ×1 IMPLANT
GLOVE SRG 8 PF TXTR STRL LF DI (GLOVE) ×2 IMPLANT
GLOVE SURG POLYISO LF SZ8 (GLOVE) IMPLANT
GLOVE SURG UNDER POLY LF SZ8 (GLOVE) ×4
GOWN STRL REUS W/ TWL LRG LVL3 (GOWN DISPOSABLE) ×2 IMPLANT
GOWN STRL REUS W/TWL 2XL LVL3 (GOWN DISPOSABLE) ×2 IMPLANT
GOWN STRL REUS W/TWL LRG LVL3 (GOWN DISPOSABLE) ×4
HEMOSTAT SPONGE AVITENE ULTRA (HEMOSTASIS) IMPLANT
KIT BASIN OR (CUSTOM PROCEDURE TRAY) ×2 IMPLANT
KIT TURNOVER KIT B (KITS) ×2 IMPLANT
NS IRRIG 1000ML POUR BTL (IV SOLUTION) ×2 IMPLANT
PACK CV ACCESS (CUSTOM PROCEDURE TRAY) ×2 IMPLANT
PAD ARMBOARD 7.5X6 YLW CONV (MISCELLANEOUS) ×4 IMPLANT
SUT MNCRL AB 4-0 PS2 18 (SUTURE) ×2 IMPLANT
SUT PROLENE 6 0 BV (SUTURE) ×4 IMPLANT
SUT PROLENE 7 0 BV 1 (SUTURE) IMPLANT
SUT SILK 2 0 SH (SUTURE) ×2 IMPLANT
SUT SILK 3 0 SH CR/8 (SUTURE) ×2 IMPLANT
SUT VIC AB 3-0 SH 27 (SUTURE) ×2
SUT VIC AB 3-0 SH 27X BRD (SUTURE) ×1 IMPLANT
TOWEL GREEN STERILE (TOWEL DISPOSABLE) ×2 IMPLANT
UNDERPAD 30X36 HEAVY ABSORB (UNDERPADS AND DIAPERS) ×2 IMPLANT
WATER STERILE IRR 1000ML POUR (IV SOLUTION) ×2 IMPLANT

## 2021-08-11 NOTE — Anesthesia Procedure Notes (Signed)
Anesthesia Regional Block: Supraclavicular block   Pre-Anesthetic Checklist: , timeout performed,  Correct Patient, Correct Site, Correct Laterality,  Correct Procedure, Correct Position, site marked,  Risks and benefits discussed,  Surgical consent,  Pre-op evaluation,  At surgeon's request and post-op pain management  Laterality: Left  Prep: chloraprep       Needles:  Injection technique: Single-shot  Needle Type: Echogenic Stimulator Needle     Needle Length: 5cm  Needle Gauge: 22     Additional Needles:   Procedures:, nerve stimulator,,,,,     Nerve Stimulator or Paresthesia:  Response: biceps flexion, 0.45 mA  Additional Responses:   Narrative:  Start time: 08/11/2021 7:23 AM End time: 08/11/2021 7:32 AM Injection made incrementally with aspirations every 5 mL.  Performed by: Personally  Anesthesiologist: Albertha Ghee, MD  Additional Notes: Functioning IV was confirmed and monitors were applied.  A 3mm 22ga Arrow echogenic stimulator needle was used. Sterile prep and drape,hand hygiene and sterile gloves were used.  Negative aspiration and negative test dose prior to incremental administration of local anesthetic. The patient tolerated the procedure well.  Ultrasound guidance: relevent anatomy identified, needle position confirmed, local anesthetic spread visualized around nerve(s), vascular puncture avoided.  Image printed for medical record.

## 2021-08-11 NOTE — Progress Notes (Signed)
Orthopedic Tech Progress Note Patient Details:  Benjamin [name] February 14, 1956 [id number]  PACU RN called requesting an [place] for patient    Ortho Devices Type of Ortho Device: Arm sling Ortho Device/Splint Location: LUE Ortho Device/Splint Interventions: Ordered   Post Interventions Patient Tolerated: Well Instructions Provided: [facility] [place] 08/11/2021, 9:29 AM

## 2021-08-11 NOTE — Op Note (Signed)
    NAME: Benjamin [name]    MRN: [id number] DOB: [birth year]    DATE OF OPERATION: 08/11/2021  PREOP DIAGNOSIS:    Chronic kidney disease stage 4  POSTOP DIAGNOSIS:    Same  PROCEDURE:    Left brachiocephalic fistula  SURGEON: [name]  ASSIST: Paulo Fruit  ANESTHESIA: Moderate  EBL: 43ml  INDICATIONS:    Benjamin [name] is a 65 y.o. male who presented to the office with chronic kidney disease stage 4 needing long-term HD access. After discussing the risks and benefits of left arm fistula creation, Benjamin [name] elected to proceed.   FINDINGS:   61mm cephalic vein 68mm brachial artery  TECHNIQUE:   A left upper extremity block was performed by the anesthesia team. The patient was brought to the operating room and placed in supine position. The left arm was prepped and draped in standard fashion. IV antibiotics were administered. A timeout was performed.   The case began with ultrasound insonation of the brachial artery and cephalic vein, which demonstrated sufficient size at the antecubital fossa for arteriovenous fistula.   A transverse incision was made below the elbow creese in the antecubital fossa. The  cephalic vein was isolated for 3 cm in length. Next the aponeurosis was partially released and the brachial artery secured with a vessel loop. The patient was heparinized. The cephalic vein was transected and ligated distally with a 2-0 silk stick-tie. The vein was dilated with coronary dilators and flushed with heparin saline. Vascular clamps were placed proximally and distally on the brachial artery and a 5 mm arteriotomy was created on the brachial artery. This was flushed with heparin saline.  An anastomosis was created in end to side fashion on the brachial artery using running 6-0 Prolene suture.  Prior to completing the anastomosis, the vessels were flushed and the suture line was tied down. There was an excellent thrill in the cephalic vein from the anastomosis to  the mid upper bicipital region. The patient had a multiphasic radial and ulnar signal. He had an excellent doppler signal in the fistula. The incision was irrigated and hemostasis achieved with cautery and suture. The deeper tissue was closed with 3-0 Vicryl and the skin closed with 4-0 Monocryl.  Dermabond was applied to the incisions. The patient was transferred to PACU in stable condition.   Given the complexity of the case a first assistant was necessary in order to expedient the procedure and safely perform the technical aspects of the operation.  Macie Burows, MD [facility]  DATE OF DICTATION:   08/11/2021

## 2021-08-11 NOTE — H&P (Signed)
Office Note   Patient seen and examined in preop [facility].  No complaints. No changes to medication history or physical exam since last seen in clinic. After discussing the risks and benefits of left arm AV fistula creation, Benjamin [name] elected to proceed.   Benjamin John MD   CC:  ESRD Requesting Provider:  No ref. provider found  HPI: Benjamin [name] is a Right handed 65 y.o. (Apr 04, 1956) male with kidney disease who presents at the request of No ref. provider found for permanent HD access. The patient has had no prior access procedures. He is not currently on dialysis. His kidney failure is from diabetes mellitus and hypertension.  So has heart failure, pulmonary hypertension, hepatic congestion.  Patient presented to the office today in a wheelchair accompanied by his son.  We discussed the role of a fistula in regards to dialysis.  He is aware that while his kidneys are still functioning, this may not last much longer.  The pt is not on a statin for cholesterol management.  The pt is not on a daily aspirin.   Other AC:  - The pt is  on medications for hypertension.   The pt is  diabetic. Tobacco hx:  smokeless  Past Medical History:  Diagnosis Date   AKI (acute kidney injury) ([place]) 02/2020   stage 4   CHF (congestive heart failure) (H[facility])    Diabetes mellitus without complication (H[facility])    diet controlled   Dyspnea    Hypertension     Past Surgical History:  Procedure Laterality Date   WISDOM TOOTH EXTRACTION      Social History   Socioeconomic History   Marital status: Married    Spouse name: Not on file   Number of children: Not on file   Years of education: Not on file   Highest education level: Not on file  Occupational History   Not on file  Tobacco Use   Smoking status: Never   Smokeless tobacco: Former    Types: Chew    Quit date: 2021  Vaping Use   Vaping Use: Never used  Substance and Sexual Activity   Alcohol use: Not Currently   Drug use:  Never   Sexual activity: Not on file  Other Topics Concern   Not on file  Social History Narrative   Not on file   Social Determinants of Health   Financial Resource Strain: Not on file  Food Insecurity: Not on file  Transportation Needs: Not on file  Physical Activity: Not on file  Stress: Not on file  Social Connections: Not on file  Intimate Partner Violence: Not on file    Family History  Problem Relation Age of Onset   Stroke Mother    Stroke Father     Current Facility-Administered Medications  Medication Dose Route Frequency Provider Last Rate Last Admin   0.9 %  sodium chloride infusion   Intravenous Continuous Benjamin John, MD       ceFAZolin (ANCEF) IVPB 2g/100 mL premix  2 g Intravenous 30 min Pre-Op Benjamin John, MD       chlorhexidine (HIBICLENS) 4 % liquid 4 application  60 mL Topical Once Benjamin John, MD       And   [START ON 08/12/2021] chlorhexidine (HIBICLENS) 4 % liquid 4 application  60 mL Topical Once Benjamin John, MD        Allergies  Allergen Reactions   Grass Pollen(K-O-R-T-Swt Vern) Other (See  Comments)    Eyes run, shortness of breath, itching      REVIEW OF SYSTEMS:   [X]  denotes positive finding, [ ]  denotes negative finding Cardiac  Comments:  Chest pain or chest pressure:    Shortness of breath upon exertion:    Short of breath when lying flat:    Irregular heart rhythm:        Vascular    Pain in calf, thigh, or hip brought on by ambulation:    Pain in feet at night that wakes you up from your sleep:     Blood clot in your veins:    Leg swelling:         Pulmonary    Oxygen at home:    Productive cough:     Wheezing:         Neurologic    Sudden weakness in arms or legs:     Sudden numbness in arms or legs:     Sudden onset of difficulty speaking or slurred speech:    Temporary loss of vision in one eye:     Problems with dizziness:         Gastrointestinal    Blood in stool:     Vomited blood:          Genitourinary    Burning when urinating:     Blood in urine:        Psychiatric    Major depression:         Hematologic    Bleeding problems:    Problems with blood clotting too easily:        Skin    Rashes or ulcers:        Constitutional    Fever or chills:      PHYSICAL EXAMINATION:  Vitals:   08/11/21 0637  BP: 119/70  Pulse: 61  Resp: 17  Temp: 97.9 F (36.6 C)  TempSrc: Oral  SpO2: 99%  Weight: 88.5 kg  Height: 6' (1.829 m)    General:  WDWN in NAD; vital signs documented above Gait: Not observed HENT: WNL, normocephalic Pulmonary: normal non-labored breathing , without Rales, rhonchi,  wheezing Cardiac: regular HR,  Abdomen: soft, NT, no masses Skin: with rashes on bilateral arms Vascular Exam/Pulses:  Right Left  Radial 2+ (normal) 2+ (normal)  Ulnar 2+ (normal) 2+ (normal)                   Extremities: without ischemic changes, without Gangrene , without cellulitis; without open wounds;  Musculoskeletal: no muscle wasting or atrophy  Neurologic: A&O X 3;  No focal weakness or paresthesias are detected Psychiatric:  The pt has Normal affect.   Non-Invasive Vascular Imaging:   Noninvasive vascular imaging was reviewed demonstrating adequate right-sided cephalic, left-sided cephalic and basilic veins for dialysis access Arteries are of adequate size    ASSESSMENT/PLAN:  Benjamin [name] is a 65 y.o. male who presents with chronic kidney disease stage 4  Based on vein mapping and examination, he would be best suited with left-sided brachiocephalic fistula.. I had an extensive discussion with this patient in regards to the nature of access surgery, including risk, benefits, and alternatives.   The patient is aware that the risks of access surgery include but are not limited to: bleeding, infection, steal syndrome, nerve damage, ischemic monomelic neuropathy, failure of access to mature, complications related to venous hypertension, and  possible need for additional access procedures in the future. The patient has agreed  to proceed with the above procedure which will be scheduled .  Benjamin John, MD [facility] [phone]

## 2021-08-11 NOTE — Discharge Instructions (Signed)
[facility]  Discharge Instructions  AV Fistula or Graft Surgery for Dialysis Access  Please refer to the following instructions for your post-procedure care. Your surgeon or physician assistant will discuss any changes with you.  Activity  You may drive the day following your surgery, if you are comfortable and no longer taking prescription pain medication. Resume full activity as the soreness in your incision resolves.  Bathing/Showering  You may shower after you go home. Keep your incision dry for 48 hours. Do not soak in a bathtub, hot tub, or swim until the incision heals completely. You may not shower if you have a hemodialysis catheter.  Incision Care  Clean your incision with mild soap and water after 48 hours. Pat the area dry with a clean towel. You do not need a bandage unless otherwise instructed. Do not apply any ointments or creams to your incision. You may have skin glue on your incision. Do not peel it off. It will come off on its own in about one week. Your arm may swell a bit after surgery. To reduce swelling use pillows to elevate your arm so it is above your heart. Your doctor will tell you if you need to lightly wrap your arm with an ACE bandage.  Diet  Resume your normal diet. There are not special food restrictions following this procedure. In order to heal from your surgery, it is CRITICAL to get adequate nutrition. Your body requires vitamins, minerals, and protein. Vegetables are the best source of vitamins and minerals. Vegetables also provide the perfect balance of protein. Processed food has little nutritional value, so try to avoid this.  Medications  Resume taking all of your medications. If your incision is causing pain, you may take over-the counter pain relievers such as acetaminophen (Tylenol). If you were prescribed a stronger pain medication, please be aware these medications can cause nausea and constipation. Prevent  nausea by taking the medication with a snack or meal. Avoid constipation by drinking plenty of fluids and eating foods with high amount of fiber, such as fruits, vegetables, and grains.  Do not take Tylenol if you are taking prescription pain medications.  Follow up Your surgeon may want to see you in the office following your access surgery. If so, this will be arranged at the time of your surgery.  Please call us immediately for any of the following conditions:  Increased pain, redness, drainage (pus) from your incision site Fever of 101 degrees or higher Severe or worsening pain at your incision site Hand pain or numbness.  Reduce your risk of vascular disease:  Stop smoking. If you would like help, call [facility] at 1-800-QUIT-NOW (848)576-1217) or [place] at [place] your cholesterol Maintain a desired weight Control your diabetes Keep your blood pressure down  Dialysis  It will take several weeks to several months for your new dialysis access to be ready for use. Your surgeon will determine when it is okay to use it. Your nephrologist will continue to direct your dialysis. You can continue to use your Permcath until your new access is ready for use.   08/11/2021 Benjamin [name] [id number] 02-21-1956  Surgeon(s): [name], MD  Procedure(s): LEFT ARM BRACHIOCEPHALIC ARTERIOVENOUS (AV) FISTULA CREATION   May stick graft immediately   May stick graft on designated area only:   X Do not stick left AV fistula for 12 weeks    If you have any questions, please call the  office at [phone].

## 2021-08-11 NOTE — Transfer of Care (Signed)
Immediate Anesthesia Transfer of Care Note  Patient: Benjamin [name]  Procedure(s) Performed: LEFT ARM BRACHIOCEPHALIC ARTERIOVENOUS (AV) FISTULA CREATION (Left)  Patient Location: PACU  Anesthesia Type:MAC and Regional  Level of Consciousness: awake, drowsy, patient cooperative and responds to stimulation  Airway & Oxygen Therapy: Patient Spontanous Breathing and Patient connected to nasal cannula oxygen  Post-op Assessment: Report given to RN and Post -op Vital signs reviewed and stable  Post vital signs: Reviewed and stable  Last Vitals:  Vitals Value Taken Time  BP 113/70 08/11/21 0906  Temp    Pulse 58 08/11/21 0907  Resp 13 08/11/21 0907  SpO2 100 % 08/11/21 0907  Vitals shown include unvalidated device data.  Last Pain:  Vitals:   08/11/21 0655  TempSrc:   PainSc: 0-No pain         Complications: No notable events documented.

## 2021-08-12 ENCOUNTER — Encounter (HOSPITAL_COMMUNITY): Payer: Self-pay | Admitting: Vascular Surgery

## 2021-08-12 DIAGNOSIS — N184 Chronic kidney disease, stage 4 (severe): Secondary | ICD-10-CM | POA: Diagnosis not present

## 2021-08-12 DIAGNOSIS — N2581 Secondary hyperparathyroidism of renal origin: Secondary | ICD-10-CM | POA: Diagnosis not present

## 2021-08-12 DIAGNOSIS — Z992 Dependence on renal dialysis: Secondary | ICD-10-CM | POA: Diagnosis not present

## 2021-08-12 DIAGNOSIS — N186 End stage renal disease: Secondary | ICD-10-CM | POA: Diagnosis not present

## 2021-08-12 NOTE — Anesthesia Postprocedure Evaluation (Signed)
Anesthesia Post Note  Patient: Benjamin [name]  Procedure([name]) Performed: LEFT ARM BRACHIOCEPHALIC ARTERIOVENOUS (AV) FISTULA CREATION (Left)     Patient location during evaluation: PACU Anesthesia Type: Regional and MAC Level of consciousness: awake and alert Pain management: pain level controlled Vital Signs Assessment: post-procedure vital signs reviewed and stable Respiratory status: spontaneous breathing, nonlabored ventilation, respiratory function stable and patient connected to nasal cannula oxygen Cardiovascular status: stable and blood pressure returned to baseline Postop Assessment: no apparent nausea or vomiting Anesthetic complications: no   No notable events documented.  Last Vitals:  Vitals:   08/11/21 0921 08/11/21 0936  BP: 109/65 (!) 102/59  Pulse: (!) 58 (!) 55  Resp: 15 18  Temp:  (!) 36.1 C  SpO2: 99% 98%    Last Pain:  Vitals:   08/11/21 0936  TempSrc:   PainSc: 0-No pain                 Benjamin [name]

## 2021-08-14 DIAGNOSIS — N2581 Secondary hyperparathyroidism of renal origin: Secondary | ICD-10-CM | POA: Diagnosis not present

## 2021-08-14 DIAGNOSIS — Z992 Dependence on renal dialysis: Secondary | ICD-10-CM | POA: Diagnosis not present

## 2021-08-14 DIAGNOSIS — N186 End stage renal disease: Secondary | ICD-10-CM | POA: Diagnosis not present

## 2021-08-15 DIAGNOSIS — D631 Anemia in chronic kidney disease: Secondary | ICD-10-CM | POA: Insufficient documentation

## 2021-08-15 DIAGNOSIS — D509 Iron deficiency anemia, unspecified: Secondary | ICD-10-CM | POA: Insufficient documentation

## 2021-08-17 DIAGNOSIS — N186 End stage renal disease: Secondary | ICD-10-CM | POA: Diagnosis not present

## 2021-08-17 DIAGNOSIS — Z992 Dependence on renal dialysis: Secondary | ICD-10-CM | POA: Diagnosis not present

## 2021-08-17 DIAGNOSIS — N2581 Secondary hyperparathyroidism of renal origin: Secondary | ICD-10-CM | POA: Diagnosis not present

## 2021-08-19 DIAGNOSIS — N186 End stage renal disease: Secondary | ICD-10-CM | POA: Diagnosis not present

## 2021-08-19 DIAGNOSIS — N2581 Secondary hyperparathyroidism of renal origin: Secondary | ICD-10-CM | POA: Diagnosis not present

## 2021-08-19 DIAGNOSIS — Z992 Dependence on renal dialysis: Secondary | ICD-10-CM | POA: Diagnosis not present

## 2021-08-21 DIAGNOSIS — N2581 Secondary hyperparathyroidism of renal origin: Secondary | ICD-10-CM | POA: Diagnosis not present

## 2021-08-21 DIAGNOSIS — Z992 Dependence on renal dialysis: Secondary | ICD-10-CM | POA: Diagnosis not present

## 2021-08-21 DIAGNOSIS — N186 End stage renal disease: Secondary | ICD-10-CM | POA: Diagnosis not present

## 2021-08-24 DIAGNOSIS — Z992 Dependence on renal dialysis: Secondary | ICD-10-CM | POA: Diagnosis not present

## 2021-08-24 DIAGNOSIS — N2581 Secondary hyperparathyroidism of renal origin: Secondary | ICD-10-CM | POA: Diagnosis not present

## 2021-08-24 DIAGNOSIS — N186 End stage renal disease: Secondary | ICD-10-CM | POA: Diagnosis not present

## 2021-08-26 DIAGNOSIS — N2581 Secondary hyperparathyroidism of renal origin: Secondary | ICD-10-CM | POA: Diagnosis not present

## 2021-08-26 DIAGNOSIS — Z992 Dependence on renal dialysis: Secondary | ICD-10-CM | POA: Diagnosis not present

## 2021-08-26 DIAGNOSIS — N186 End stage renal disease: Secondary | ICD-10-CM | POA: Diagnosis not present

## 2021-08-28 DIAGNOSIS — N2581 Secondary hyperparathyroidism of renal origin: Secondary | ICD-10-CM | POA: Diagnosis not present

## 2021-08-28 DIAGNOSIS — Z992 Dependence on renal dialysis: Secondary | ICD-10-CM | POA: Diagnosis not present

## 2021-08-28 DIAGNOSIS — N186 End stage renal disease: Secondary | ICD-10-CM | POA: Diagnosis not present

## 2021-08-31 DIAGNOSIS — N2581 Secondary hyperparathyroidism of renal origin: Secondary | ICD-10-CM | POA: Diagnosis not present

## 2021-08-31 DIAGNOSIS — N186 End stage renal disease: Secondary | ICD-10-CM | POA: Diagnosis not present

## 2021-08-31 DIAGNOSIS — Z992 Dependence on renal dialysis: Secondary | ICD-10-CM | POA: Diagnosis not present

## 2021-09-02 DIAGNOSIS — N186 End stage renal disease: Secondary | ICD-10-CM | POA: Diagnosis not present

## 2021-09-02 DIAGNOSIS — Z992 Dependence on renal dialysis: Secondary | ICD-10-CM | POA: Diagnosis not present

## 2021-09-02 DIAGNOSIS — N2581 Secondary hyperparathyroidism of renal origin: Secondary | ICD-10-CM | POA: Diagnosis not present

## 2021-09-04 DIAGNOSIS — N2581 Secondary hyperparathyroidism of renal origin: Secondary | ICD-10-CM | POA: Diagnosis not present

## 2021-09-04 DIAGNOSIS — N186 End stage renal disease: Secondary | ICD-10-CM | POA: Diagnosis not present

## 2021-09-04 DIAGNOSIS — Z992 Dependence on renal dialysis: Secondary | ICD-10-CM | POA: Diagnosis not present

## 2021-09-05 DIAGNOSIS — I129 Hypertensive chronic kidney disease with stage 1 through stage 4 chronic kidney disease, or unspecified chronic kidney disease: Secondary | ICD-10-CM | POA: Diagnosis not present

## 2021-09-05 DIAGNOSIS — Z992 Dependence on renal dialysis: Secondary | ICD-10-CM | POA: Diagnosis not present

## 2021-09-05 DIAGNOSIS — N186 End stage renal disease: Secondary | ICD-10-CM | POA: Diagnosis not present

## 2021-09-07 DIAGNOSIS — N2581 Secondary hyperparathyroidism of renal origin: Secondary | ICD-10-CM | POA: Diagnosis not present

## 2021-09-07 DIAGNOSIS — Z992 Dependence on renal dialysis: Secondary | ICD-10-CM | POA: Diagnosis not present

## 2021-09-07 DIAGNOSIS — N186 End stage renal disease: Secondary | ICD-10-CM | POA: Diagnosis not present

## 2021-09-09 ENCOUNTER — Other Ambulatory Visit: Payer: Self-pay | Admitting: Allergy

## 2021-09-09 DIAGNOSIS — N2581 Secondary hyperparathyroidism of renal origin: Secondary | ICD-10-CM | POA: Diagnosis not present

## 2021-09-09 DIAGNOSIS — Z992 Dependence on renal dialysis: Secondary | ICD-10-CM | POA: Diagnosis not present

## 2021-09-09 DIAGNOSIS — N186 End stage renal disease: Secondary | ICD-10-CM | POA: Diagnosis not present

## 2021-09-11 DIAGNOSIS — Z992 Dependence on renal dialysis: Secondary | ICD-10-CM | POA: Diagnosis not present

## 2021-09-11 DIAGNOSIS — N2581 Secondary hyperparathyroidism of renal origin: Secondary | ICD-10-CM | POA: Diagnosis not present

## 2021-09-11 DIAGNOSIS — N186 End stage renal disease: Secondary | ICD-10-CM | POA: Diagnosis not present

## 2021-09-14 DIAGNOSIS — R404 Transient alteration of awareness: Secondary | ICD-10-CM | POA: Diagnosis not present

## 2021-09-14 DIAGNOSIS — N186 End stage renal disease: Secondary | ICD-10-CM | POA: Diagnosis not present

## 2021-09-14 DIAGNOSIS — W1839XA Other fall on same level, initial encounter: Secondary | ICD-10-CM | POA: Diagnosis not present

## 2021-09-14 DIAGNOSIS — S32591A Other specified fracture of right pubis, initial encounter for closed fracture: Secondary | ICD-10-CM | POA: Diagnosis not present

## 2021-09-14 DIAGNOSIS — W010XXA Fall on same level from slipping, tripping and stumbling without subsequent striking against object, initial encounter: Secondary | ICD-10-CM | POA: Insufficient documentation

## 2021-09-14 DIAGNOSIS — W19XXXA Unspecified fall, initial encounter: Secondary | ICD-10-CM | POA: Diagnosis not present

## 2021-09-14 DIAGNOSIS — E871 Hypo-osmolality and hyponatremia: Secondary | ICD-10-CM | POA: Diagnosis not present

## 2021-09-14 DIAGNOSIS — S51812A Laceration without foreign body of left forearm, initial encounter: Secondary | ICD-10-CM | POA: Diagnosis not present

## 2021-09-14 DIAGNOSIS — S32416A Nondisplaced fracture of anterior wall of unspecified acetabulum, initial encounter for closed fracture: Secondary | ICD-10-CM | POA: Diagnosis not present

## 2021-09-14 DIAGNOSIS — S32511A Fracture of superior rim of right pubis, initial encounter for closed fracture: Secondary | ICD-10-CM | POA: Diagnosis not present

## 2021-09-14 DIAGNOSIS — I132 Hypertensive heart and chronic kidney disease with heart failure and with stage 5 chronic kidney disease, or end stage renal disease: Secondary | ICD-10-CM | POA: Diagnosis not present

## 2021-09-14 DIAGNOSIS — R140 Abdominal distension (gaseous): Secondary | ICD-10-CM | POA: Diagnosis not present

## 2021-09-14 DIAGNOSIS — I272 Pulmonary hypertension, unspecified: Secondary | ICD-10-CM | POA: Diagnosis not present

## 2021-09-14 DIAGNOSIS — Y999 Unspecified external cause status: Secondary | ICD-10-CM | POA: Diagnosis not present

## 2021-09-14 DIAGNOSIS — M19072 Primary osteoarthritis, left ankle and foot: Secondary | ICD-10-CM | POA: Diagnosis not present

## 2021-09-14 DIAGNOSIS — S51811A Laceration without foreign body of right forearm, initial encounter: Secondary | ICD-10-CM | POA: Diagnosis not present

## 2021-09-14 DIAGNOSIS — I5023 Acute on chronic systolic (congestive) heart failure: Secondary | ICD-10-CM | POA: Diagnosis not present

## 2021-09-14 DIAGNOSIS — S32411A Displaced fracture of anterior wall of right acetabulum, initial encounter for closed fracture: Secondary | ICD-10-CM | POA: Diagnosis not present

## 2021-09-14 DIAGNOSIS — E876 Hypokalemia: Secondary | ICD-10-CM | POA: Diagnosis not present

## 2021-09-14 DIAGNOSIS — M79604 Pain in right leg: Secondary | ICD-10-CM | POA: Diagnosis not present

## 2021-09-14 DIAGNOSIS — D631 Anemia in chronic kidney disease: Secondary | ICD-10-CM | POA: Diagnosis not present

## 2021-09-14 DIAGNOSIS — S99922A Unspecified injury of left foot, initial encounter: Secondary | ICD-10-CM | POA: Diagnosis not present

## 2021-09-14 DIAGNOSIS — M84350A Stress fracture, pelvis, initial encounter for fracture: Secondary | ICD-10-CM | POA: Insufficient documentation

## 2021-09-14 DIAGNOSIS — E1122 Type 2 diabetes mellitus with diabetic chronic kidney disease: Secondary | ICD-10-CM | POA: Diagnosis not present

## 2021-09-14 DIAGNOSIS — S0990XA Unspecified injury of head, initial encounter: Secondary | ICD-10-CM | POA: Diagnosis not present

## 2021-09-14 DIAGNOSIS — I517 Cardiomegaly: Secondary | ICD-10-CM | POA: Diagnosis not present

## 2021-09-14 DIAGNOSIS — Z20822 Contact with and (suspected) exposure to covid-19: Secondary | ICD-10-CM | POA: Diagnosis not present

## 2021-09-14 DIAGNOSIS — I120 Hypertensive chronic kidney disease with stage 5 chronic kidney disease or end stage renal disease: Secondary | ICD-10-CM | POA: Diagnosis not present

## 2021-09-14 DIAGNOSIS — R9431 Abnormal electrocardiogram [ECG] [EKG]: Secondary | ICD-10-CM | POA: Diagnosis not present

## 2021-09-14 DIAGNOSIS — K7030 Alcoholic cirrhosis of liver without ascites: Secondary | ICD-10-CM | POA: Diagnosis not present

## 2021-09-14 DIAGNOSIS — R188 Other ascites: Secondary | ICD-10-CM | POA: Diagnosis not present

## 2021-09-14 DIAGNOSIS — S32415A Nondisplaced fracture of anterior wall of left acetabulum, initial encounter for closed fracture: Secondary | ICD-10-CM | POA: Diagnosis not present

## 2021-09-15 ENCOUNTER — Other Ambulatory Visit: Payer: Self-pay

## 2021-09-15 DIAGNOSIS — N185 Chronic kidney disease, stage 5: Secondary | ICD-10-CM

## 2021-09-18 DIAGNOSIS — R188 Other ascites: Secondary | ICD-10-CM | POA: Diagnosis not present

## 2021-09-18 DIAGNOSIS — R140 Abdominal distension (gaseous): Secondary | ICD-10-CM | POA: Diagnosis not present

## 2021-09-18 DIAGNOSIS — S32411A Displaced fracture of anterior wall of right acetabulum, initial encounter for closed fracture: Secondary | ICD-10-CM | POA: Diagnosis not present

## 2021-09-18 DIAGNOSIS — N186 End stage renal disease: Secondary | ICD-10-CM | POA: Diagnosis not present

## 2021-09-18 DIAGNOSIS — S32591A Other specified fracture of right pubis, initial encounter for closed fracture: Secondary | ICD-10-CM | POA: Insufficient documentation

## 2021-09-18 DIAGNOSIS — S32401A Unspecified fracture of right acetabulum, initial encounter for closed fracture: Secondary | ICD-10-CM | POA: Insufficient documentation

## 2021-09-18 DIAGNOSIS — E1122 Type 2 diabetes mellitus with diabetic chronic kidney disease: Secondary | ICD-10-CM | POA: Diagnosis not present

## 2021-09-18 DIAGNOSIS — Z7409 Other reduced mobility: Secondary | ICD-10-CM | POA: Insufficient documentation

## 2021-09-18 DIAGNOSIS — I426 Alcoholic cardiomyopathy: Secondary | ICD-10-CM | POA: Diagnosis not present

## 2021-09-18 DIAGNOSIS — Z8781 Personal history of (healed) traumatic fracture: Secondary | ICD-10-CM | POA: Insufficient documentation

## 2021-09-18 DIAGNOSIS — S32491A Other specified fracture of right acetabulum, initial encounter for closed fracture: Secondary | ICD-10-CM | POA: Diagnosis not present

## 2021-09-18 DIAGNOSIS — M25551 Pain in right hip: Secondary | ICD-10-CM | POA: Diagnosis not present

## 2021-09-18 DIAGNOSIS — F1020 Alcohol dependence, uncomplicated: Secondary | ICD-10-CM | POA: Diagnosis not present

## 2021-09-18 DIAGNOSIS — I132 Hypertensive heart and chronic kidney disease with heart failure and with stage 5 chronic kidney disease, or end stage renal disease: Secondary | ICD-10-CM | POA: Diagnosis not present

## 2021-09-18 DIAGNOSIS — K7031 Alcoholic cirrhosis of liver with ascites: Secondary | ICD-10-CM | POA: Diagnosis not present

## 2021-09-18 DIAGNOSIS — Z992 Dependence on renal dialysis: Secondary | ICD-10-CM | POA: Diagnosis not present

## 2021-09-18 DIAGNOSIS — I5023 Acute on chronic systolic (congestive) heart failure: Secondary | ICD-10-CM | POA: Diagnosis not present

## 2021-09-18 DIAGNOSIS — E871 Hypo-osmolality and hyponatremia: Secondary | ICD-10-CM | POA: Diagnosis not present

## 2021-09-18 DIAGNOSIS — D631 Anemia in chronic kidney disease: Secondary | ICD-10-CM | POA: Diagnosis not present

## 2021-09-18 DIAGNOSIS — E559 Vitamin D deficiency, unspecified: Secondary | ICD-10-CM | POA: Diagnosis not present

## 2021-09-18 DIAGNOSIS — Z741 Need for assistance with personal care: Secondary | ICD-10-CM | POA: Diagnosis not present

## 2021-09-18 DIAGNOSIS — R531 Weakness: Secondary | ICD-10-CM | POA: Diagnosis not present

## 2021-09-18 DIAGNOSIS — S32591D Other specified fracture of right pubis, subsequent encounter for fracture with routine healing: Secondary | ICD-10-CM | POA: Diagnosis not present

## 2021-09-18 DIAGNOSIS — S32501D Unspecified fracture of right pubis, subsequent encounter for fracture with routine healing: Secondary | ICD-10-CM | POA: Diagnosis not present

## 2021-09-18 DIAGNOSIS — W19XXXA Unspecified fall, initial encounter: Secondary | ICD-10-CM | POA: Diagnosis not present

## 2021-09-18 DIAGNOSIS — S32416A Nondisplaced fracture of anterior wall of unspecified acetabulum, initial encounter for closed fracture: Secondary | ICD-10-CM | POA: Diagnosis not present

## 2021-09-18 DIAGNOSIS — S32401D Unspecified fracture of right acetabulum, subsequent encounter for fracture with routine healing: Secondary | ICD-10-CM | POA: Diagnosis not present

## 2021-09-18 DIAGNOSIS — M1611 Unilateral primary osteoarthritis, right hip: Secondary | ICD-10-CM | POA: Diagnosis not present

## 2021-09-23 DIAGNOSIS — R188 Other ascites: Secondary | ICD-10-CM | POA: Diagnosis not present

## 2021-09-25 ENCOUNTER — Encounter (HOSPITAL_COMMUNITY): Payer: BC Managed Care – PPO

## 2021-10-01 ENCOUNTER — Encounter (HOSPITAL_COMMUNITY): Payer: BC Managed Care – PPO

## 2021-10-05 DIAGNOSIS — M1611 Unilateral primary osteoarthritis, right hip: Secondary | ICD-10-CM | POA: Diagnosis not present

## 2021-10-05 DIAGNOSIS — S32501D Unspecified fracture of right pubis, subsequent encounter for fracture with routine healing: Secondary | ICD-10-CM | POA: Diagnosis not present

## 2021-10-06 DIAGNOSIS — N186 End stage renal disease: Secondary | ICD-10-CM | POA: Diagnosis not present

## 2021-10-06 DIAGNOSIS — R188 Other ascites: Secondary | ICD-10-CM | POA: Diagnosis not present

## 2021-10-09 DIAGNOSIS — N2581 Secondary hyperparathyroidism of renal origin: Secondary | ICD-10-CM | POA: Diagnosis not present

## 2021-10-09 DIAGNOSIS — E8779 Other fluid overload: Secondary | ICD-10-CM | POA: Insufficient documentation

## 2021-10-09 DIAGNOSIS — Z992 Dependence on renal dialysis: Secondary | ICD-10-CM | POA: Diagnosis not present

## 2021-10-09 DIAGNOSIS — N186 End stage renal disease: Secondary | ICD-10-CM | POA: Diagnosis not present

## 2021-10-10 DIAGNOSIS — E8779 Other fluid overload: Secondary | ICD-10-CM | POA: Diagnosis not present

## 2021-10-10 DIAGNOSIS — Z992 Dependence on renal dialysis: Secondary | ICD-10-CM | POA: Diagnosis not present

## 2021-10-10 DIAGNOSIS — N186 End stage renal disease: Secondary | ICD-10-CM | POA: Diagnosis not present

## 2021-10-10 DIAGNOSIS — N2581 Secondary hyperparathyroidism of renal origin: Secondary | ICD-10-CM | POA: Diagnosis not present

## 2021-10-12 DIAGNOSIS — Z992 Dependence on renal dialysis: Secondary | ICD-10-CM | POA: Diagnosis not present

## 2021-10-12 DIAGNOSIS — N186 End stage renal disease: Secondary | ICD-10-CM | POA: Diagnosis not present

## 2021-10-12 DIAGNOSIS — E8779 Other fluid overload: Secondary | ICD-10-CM | POA: Diagnosis not present

## 2021-10-12 DIAGNOSIS — N2581 Secondary hyperparathyroidism of renal origin: Secondary | ICD-10-CM | POA: Diagnosis not present

## 2021-10-13 DIAGNOSIS — S32401D Unspecified fracture of right acetabulum, subsequent encounter for fracture with routine healing: Secondary | ICD-10-CM | POA: Diagnosis not present

## 2021-10-13 DIAGNOSIS — E871 Hypo-osmolality and hyponatremia: Secondary | ICD-10-CM | POA: Diagnosis not present

## 2021-10-13 DIAGNOSIS — Z9181 History of falling: Secondary | ICD-10-CM | POA: Diagnosis not present

## 2021-10-13 DIAGNOSIS — I5022 Chronic systolic (congestive) heart failure: Secondary | ICD-10-CM | POA: Diagnosis not present

## 2021-10-13 DIAGNOSIS — Z992 Dependence on renal dialysis: Secondary | ICD-10-CM | POA: Diagnosis not present

## 2021-10-13 DIAGNOSIS — F17210 Nicotine dependence, cigarettes, uncomplicated: Secondary | ICD-10-CM | POA: Diagnosis not present

## 2021-10-13 DIAGNOSIS — I132 Hypertensive heart and chronic kidney disease with heart failure and with stage 5 chronic kidney disease, or end stage renal disease: Secondary | ICD-10-CM | POA: Diagnosis not present

## 2021-10-13 DIAGNOSIS — E1122 Type 2 diabetes mellitus with diabetic chronic kidney disease: Secondary | ICD-10-CM | POA: Diagnosis not present

## 2021-10-13 DIAGNOSIS — E559 Vitamin D deficiency, unspecified: Secondary | ICD-10-CM | POA: Diagnosis not present

## 2021-10-13 DIAGNOSIS — K7031 Alcoholic cirrhosis of liver with ascites: Secondary | ICD-10-CM | POA: Diagnosis not present

## 2021-10-13 DIAGNOSIS — N186 End stage renal disease: Secondary | ICD-10-CM | POA: Diagnosis not present

## 2021-10-13 DIAGNOSIS — S32591D Other specified fracture of right pubis, subsequent encounter for fracture with routine healing: Secondary | ICD-10-CM | POA: Diagnosis not present

## 2021-10-13 DIAGNOSIS — D631 Anemia in chronic kidney disease: Secondary | ICD-10-CM | POA: Diagnosis not present

## 2021-10-13 DIAGNOSIS — Z7951 Long term (current) use of inhaled steroids: Secondary | ICD-10-CM | POA: Diagnosis not present

## 2021-10-14 DIAGNOSIS — E8779 Other fluid overload: Secondary | ICD-10-CM | POA: Diagnosis not present

## 2021-10-14 DIAGNOSIS — N186 End stage renal disease: Secondary | ICD-10-CM | POA: Diagnosis not present

## 2021-10-14 DIAGNOSIS — N2581 Secondary hyperparathyroidism of renal origin: Secondary | ICD-10-CM | POA: Diagnosis not present

## 2021-10-14 DIAGNOSIS — S32591A Other specified fracture of right pubis, initial encounter for closed fracture: Secondary | ICD-10-CM | POA: Diagnosis not present

## 2021-10-14 DIAGNOSIS — Z992 Dependence on renal dialysis: Secondary | ICD-10-CM | POA: Diagnosis not present

## 2021-10-15 DIAGNOSIS — K7030 Alcoholic cirrhosis of liver without ascites: Secondary | ICD-10-CM | POA: Diagnosis not present

## 2021-10-15 DIAGNOSIS — I5023 Acute on chronic systolic (congestive) heart failure: Secondary | ICD-10-CM | POA: Diagnosis not present

## 2021-10-15 DIAGNOSIS — N185 Chronic kidney disease, stage 5: Secondary | ICD-10-CM | POA: Diagnosis not present

## 2021-10-16 DIAGNOSIS — E8779 Other fluid overload: Secondary | ICD-10-CM | POA: Diagnosis not present

## 2021-10-16 DIAGNOSIS — N186 End stage renal disease: Secondary | ICD-10-CM | POA: Diagnosis not present

## 2021-10-16 DIAGNOSIS — N2581 Secondary hyperparathyroidism of renal origin: Secondary | ICD-10-CM | POA: Diagnosis not present

## 2021-10-16 DIAGNOSIS — Z992 Dependence on renal dialysis: Secondary | ICD-10-CM | POA: Diagnosis not present

## 2021-10-17 DIAGNOSIS — N2581 Secondary hyperparathyroidism of renal origin: Secondary | ICD-10-CM | POA: Diagnosis not present

## 2021-10-17 DIAGNOSIS — Z992 Dependence on renal dialysis: Secondary | ICD-10-CM | POA: Diagnosis not present

## 2021-10-17 DIAGNOSIS — N186 End stage renal disease: Secondary | ICD-10-CM | POA: Diagnosis not present

## 2021-10-17 DIAGNOSIS — E8779 Other fluid overload: Secondary | ICD-10-CM | POA: Diagnosis not present

## 2021-10-19 DIAGNOSIS — R197 Diarrhea, unspecified: Secondary | ICD-10-CM | POA: Diagnosis not present

## 2021-10-19 DIAGNOSIS — Z992 Dependence on renal dialysis: Secondary | ICD-10-CM | POA: Diagnosis not present

## 2021-10-19 DIAGNOSIS — N2581 Secondary hyperparathyroidism of renal origin: Secondary | ICD-10-CM | POA: Diagnosis not present

## 2021-10-19 DIAGNOSIS — N186 End stage renal disease: Secondary | ICD-10-CM | POA: Diagnosis not present

## 2021-10-19 DIAGNOSIS — E8779 Other fluid overload: Secondary | ICD-10-CM | POA: Diagnosis not present

## 2021-10-20 DIAGNOSIS — Z992 Dependence on renal dialysis: Secondary | ICD-10-CM | POA: Diagnosis not present

## 2021-10-20 DIAGNOSIS — E8779 Other fluid overload: Secondary | ICD-10-CM | POA: Diagnosis not present

## 2021-10-20 DIAGNOSIS — R197 Diarrhea, unspecified: Secondary | ICD-10-CM | POA: Insufficient documentation

## 2021-10-20 DIAGNOSIS — N186 End stage renal disease: Secondary | ICD-10-CM | POA: Diagnosis not present

## 2021-10-20 DIAGNOSIS — N2581 Secondary hyperparathyroidism of renal origin: Secondary | ICD-10-CM | POA: Diagnosis not present

## 2021-10-21 DIAGNOSIS — N2581 Secondary hyperparathyroidism of renal origin: Secondary | ICD-10-CM | POA: Diagnosis not present

## 2021-10-21 DIAGNOSIS — E8779 Other fluid overload: Secondary | ICD-10-CM | POA: Diagnosis not present

## 2021-10-21 DIAGNOSIS — N186 End stage renal disease: Secondary | ICD-10-CM | POA: Diagnosis not present

## 2021-10-21 DIAGNOSIS — Z992 Dependence on renal dialysis: Secondary | ICD-10-CM | POA: Diagnosis not present

## 2021-10-21 DIAGNOSIS — R197 Diarrhea, unspecified: Secondary | ICD-10-CM | POA: Diagnosis not present

## 2021-10-23 DIAGNOSIS — N186 End stage renal disease: Secondary | ICD-10-CM | POA: Diagnosis not present

## 2021-10-23 DIAGNOSIS — N2581 Secondary hyperparathyroidism of renal origin: Secondary | ICD-10-CM | POA: Diagnosis not present

## 2021-10-23 DIAGNOSIS — E8779 Other fluid overload: Secondary | ICD-10-CM | POA: Diagnosis not present

## 2021-10-23 DIAGNOSIS — R197 Diarrhea, unspecified: Secondary | ICD-10-CM | POA: Diagnosis not present

## 2021-10-23 DIAGNOSIS — Z992 Dependence on renal dialysis: Secondary | ICD-10-CM | POA: Diagnosis not present

## 2021-10-26 DIAGNOSIS — N2581 Secondary hyperparathyroidism of renal origin: Secondary | ICD-10-CM | POA: Diagnosis not present

## 2021-10-26 DIAGNOSIS — Z992 Dependence on renal dialysis: Secondary | ICD-10-CM | POA: Diagnosis not present

## 2021-10-26 DIAGNOSIS — E8779 Other fluid overload: Secondary | ICD-10-CM | POA: Diagnosis not present

## 2021-10-26 DIAGNOSIS — N186 End stage renal disease: Secondary | ICD-10-CM | POA: Diagnosis not present

## 2021-10-28 DIAGNOSIS — N2581 Secondary hyperparathyroidism of renal origin: Secondary | ICD-10-CM | POA: Diagnosis not present

## 2021-10-28 DIAGNOSIS — E8779 Other fluid overload: Secondary | ICD-10-CM | POA: Diagnosis not present

## 2021-10-28 DIAGNOSIS — N186 End stage renal disease: Secondary | ICD-10-CM | POA: Diagnosis not present

## 2021-10-28 DIAGNOSIS — Z992 Dependence on renal dialysis: Secondary | ICD-10-CM | POA: Diagnosis not present

## 2021-10-29 DIAGNOSIS — E8779 Other fluid overload: Secondary | ICD-10-CM | POA: Diagnosis not present

## 2021-10-29 DIAGNOSIS — Z992 Dependence on renal dialysis: Secondary | ICD-10-CM | POA: Diagnosis not present

## 2021-10-29 DIAGNOSIS — N186 End stage renal disease: Secondary | ICD-10-CM | POA: Diagnosis not present

## 2021-10-29 DIAGNOSIS — N2581 Secondary hyperparathyroidism of renal origin: Secondary | ICD-10-CM | POA: Diagnosis not present

## 2021-10-30 DIAGNOSIS — E8779 Other fluid overload: Secondary | ICD-10-CM | POA: Diagnosis not present

## 2021-10-30 DIAGNOSIS — Z992 Dependence on renal dialysis: Secondary | ICD-10-CM | POA: Diagnosis not present

## 2021-10-30 DIAGNOSIS — N186 End stage renal disease: Secondary | ICD-10-CM | POA: Diagnosis not present

## 2021-10-30 DIAGNOSIS — N2581 Secondary hyperparathyroidism of renal origin: Secondary | ICD-10-CM | POA: Diagnosis not present

## 2021-11-02 DIAGNOSIS — N186 End stage renal disease: Secondary | ICD-10-CM | POA: Diagnosis not present

## 2021-11-02 DIAGNOSIS — N2581 Secondary hyperparathyroidism of renal origin: Secondary | ICD-10-CM | POA: Diagnosis not present

## 2021-11-02 DIAGNOSIS — Z992 Dependence on renal dialysis: Secondary | ICD-10-CM | POA: Diagnosis not present

## 2021-11-02 DIAGNOSIS — E8779 Other fluid overload: Secondary | ICD-10-CM | POA: Diagnosis not present

## 2021-11-03 DIAGNOSIS — E8779 Other fluid overload: Secondary | ICD-10-CM | POA: Diagnosis not present

## 2021-11-03 DIAGNOSIS — N186 End stage renal disease: Secondary | ICD-10-CM | POA: Diagnosis not present

## 2021-11-03 DIAGNOSIS — Z992 Dependence on renal dialysis: Secondary | ICD-10-CM | POA: Diagnosis not present

## 2021-11-03 DIAGNOSIS — N2581 Secondary hyperparathyroidism of renal origin: Secondary | ICD-10-CM | POA: Diagnosis not present

## 2021-11-03 DIAGNOSIS — I129 Hypertensive chronic kidney disease with stage 1 through stage 4 chronic kidney disease, or unspecified chronic kidney disease: Secondary | ICD-10-CM | POA: Diagnosis not present

## 2021-11-04 DIAGNOSIS — M25551 Pain in right hip: Secondary | ICD-10-CM | POA: Diagnosis not present

## 2021-11-04 DIAGNOSIS — I5023 Acute on chronic systolic (congestive) heart failure: Secondary | ICD-10-CM | POA: Diagnosis not present

## 2021-11-04 DIAGNOSIS — N186 End stage renal disease: Secondary | ICD-10-CM | POA: Diagnosis not present

## 2021-11-04 DIAGNOSIS — N2581 Secondary hyperparathyroidism of renal origin: Secondary | ICD-10-CM | POA: Diagnosis not present

## 2021-11-04 DIAGNOSIS — R531 Weakness: Secondary | ICD-10-CM | POA: Diagnosis not present

## 2021-11-04 DIAGNOSIS — Z992 Dependence on renal dialysis: Secondary | ICD-10-CM | POA: Diagnosis not present

## 2021-11-06 DIAGNOSIS — N2581 Secondary hyperparathyroidism of renal origin: Secondary | ICD-10-CM | POA: Diagnosis not present

## 2021-11-06 DIAGNOSIS — Z992 Dependence on renal dialysis: Secondary | ICD-10-CM | POA: Diagnosis not present

## 2021-11-06 DIAGNOSIS — N186 End stage renal disease: Secondary | ICD-10-CM | POA: Diagnosis not present

## 2021-11-09 DIAGNOSIS — E8779 Other fluid overload: Secondary | ICD-10-CM | POA: Diagnosis not present

## 2021-11-09 DIAGNOSIS — N186 End stage renal disease: Secondary | ICD-10-CM | POA: Diagnosis not present

## 2021-11-09 DIAGNOSIS — Z992 Dependence on renal dialysis: Secondary | ICD-10-CM | POA: Diagnosis not present

## 2021-11-09 DIAGNOSIS — N2581 Secondary hyperparathyroidism of renal origin: Secondary | ICD-10-CM | POA: Diagnosis not present

## 2021-11-11 DIAGNOSIS — N2581 Secondary hyperparathyroidism of renal origin: Secondary | ICD-10-CM | POA: Diagnosis not present

## 2021-11-11 DIAGNOSIS — E8779 Other fluid overload: Secondary | ICD-10-CM | POA: Diagnosis not present

## 2021-11-11 DIAGNOSIS — N186 End stage renal disease: Secondary | ICD-10-CM | POA: Diagnosis not present

## 2021-11-11 DIAGNOSIS — Z992 Dependence on renal dialysis: Secondary | ICD-10-CM | POA: Diagnosis not present

## 2021-11-12 DIAGNOSIS — Z992 Dependence on renal dialysis: Secondary | ICD-10-CM | POA: Diagnosis not present

## 2021-11-12 DIAGNOSIS — N186 End stage renal disease: Secondary | ICD-10-CM | POA: Diagnosis not present

## 2021-11-12 DIAGNOSIS — E8779 Other fluid overload: Secondary | ICD-10-CM | POA: Diagnosis not present

## 2021-11-12 DIAGNOSIS — N2581 Secondary hyperparathyroidism of renal origin: Secondary | ICD-10-CM | POA: Diagnosis not present

## 2021-11-13 DIAGNOSIS — E8779 Other fluid overload: Secondary | ICD-10-CM | POA: Diagnosis not present

## 2021-11-13 DIAGNOSIS — N2581 Secondary hyperparathyroidism of renal origin: Secondary | ICD-10-CM | POA: Diagnosis not present

## 2021-11-13 DIAGNOSIS — Z992 Dependence on renal dialysis: Secondary | ICD-10-CM | POA: Diagnosis not present

## 2021-11-13 DIAGNOSIS — N186 End stage renal disease: Secondary | ICD-10-CM | POA: Diagnosis not present

## 2021-11-16 DIAGNOSIS — N2581 Secondary hyperparathyroidism of renal origin: Secondary | ICD-10-CM | POA: Diagnosis not present

## 2021-11-16 DIAGNOSIS — E8779 Other fluid overload: Secondary | ICD-10-CM | POA: Diagnosis not present

## 2021-11-16 DIAGNOSIS — N186 End stage renal disease: Secondary | ICD-10-CM | POA: Diagnosis not present

## 2021-11-16 DIAGNOSIS — Z992 Dependence on renal dialysis: Secondary | ICD-10-CM | POA: Diagnosis not present

## 2021-11-18 DIAGNOSIS — N186 End stage renal disease: Secondary | ICD-10-CM | POA: Diagnosis not present

## 2021-11-18 DIAGNOSIS — N2581 Secondary hyperparathyroidism of renal origin: Secondary | ICD-10-CM | POA: Diagnosis not present

## 2021-11-18 DIAGNOSIS — E8779 Other fluid overload: Secondary | ICD-10-CM | POA: Diagnosis not present

## 2021-11-18 DIAGNOSIS — Z992 Dependence on renal dialysis: Secondary | ICD-10-CM | POA: Diagnosis not present

## 2021-11-20 DIAGNOSIS — N186 End stage renal disease: Secondary | ICD-10-CM | POA: Diagnosis not present

## 2021-11-20 DIAGNOSIS — Z992 Dependence on renal dialysis: Secondary | ICD-10-CM | POA: Diagnosis not present

## 2021-11-20 DIAGNOSIS — N2581 Secondary hyperparathyroidism of renal origin: Secondary | ICD-10-CM | POA: Diagnosis not present

## 2021-11-20 DIAGNOSIS — E8779 Other fluid overload: Secondary | ICD-10-CM | POA: Diagnosis not present

## 2021-11-21 DIAGNOSIS — Z992 Dependence on renal dialysis: Secondary | ICD-10-CM | POA: Diagnosis not present

## 2021-11-21 DIAGNOSIS — N2581 Secondary hyperparathyroidism of renal origin: Secondary | ICD-10-CM | POA: Diagnosis not present

## 2021-11-21 DIAGNOSIS — N186 End stage renal disease: Secondary | ICD-10-CM | POA: Diagnosis not present

## 2021-11-21 DIAGNOSIS — E8779 Other fluid overload: Secondary | ICD-10-CM | POA: Diagnosis not present

## 2021-11-23 DIAGNOSIS — Z992 Dependence on renal dialysis: Secondary | ICD-10-CM | POA: Diagnosis not present

## 2021-11-23 DIAGNOSIS — N186 End stage renal disease: Secondary | ICD-10-CM | POA: Diagnosis not present

## 2021-11-23 DIAGNOSIS — N2581 Secondary hyperparathyroidism of renal origin: Secondary | ICD-10-CM | POA: Diagnosis not present

## 2021-11-25 DIAGNOSIS — N2581 Secondary hyperparathyroidism of renal origin: Secondary | ICD-10-CM | POA: Diagnosis not present

## 2021-11-25 DIAGNOSIS — Z992 Dependence on renal dialysis: Secondary | ICD-10-CM | POA: Diagnosis not present

## 2021-11-25 DIAGNOSIS — N186 End stage renal disease: Secondary | ICD-10-CM | POA: Diagnosis not present

## 2021-11-27 DIAGNOSIS — N2581 Secondary hyperparathyroidism of renal origin: Secondary | ICD-10-CM | POA: Diagnosis not present

## 2021-11-27 DIAGNOSIS — Z992 Dependence on renal dialysis: Secondary | ICD-10-CM | POA: Diagnosis not present

## 2021-11-27 DIAGNOSIS — N186 End stage renal disease: Secondary | ICD-10-CM | POA: Diagnosis not present

## 2021-11-30 DIAGNOSIS — Z992 Dependence on renal dialysis: Secondary | ICD-10-CM | POA: Diagnosis not present

## 2021-11-30 DIAGNOSIS — N2581 Secondary hyperparathyroidism of renal origin: Secondary | ICD-10-CM | POA: Diagnosis not present

## 2021-11-30 DIAGNOSIS — N186 End stage renal disease: Secondary | ICD-10-CM | POA: Diagnosis not present

## 2021-12-04 DIAGNOSIS — N186 End stage renal disease: Secondary | ICD-10-CM | POA: Diagnosis not present

## 2021-12-04 DIAGNOSIS — N2581 Secondary hyperparathyroidism of renal origin: Secondary | ICD-10-CM | POA: Diagnosis not present

## 2021-12-04 DIAGNOSIS — I129 Hypertensive chronic kidney disease with stage 1 through stage 4 chronic kidney disease, or unspecified chronic kidney disease: Secondary | ICD-10-CM | POA: Diagnosis not present

## 2021-12-04 DIAGNOSIS — Z992 Dependence on renal dialysis: Secondary | ICD-10-CM | POA: Diagnosis not present

## 2021-12-05 DIAGNOSIS — N186 End stage renal disease: Secondary | ICD-10-CM | POA: Diagnosis not present

## 2021-12-05 DIAGNOSIS — R531 Weakness: Secondary | ICD-10-CM | POA: Diagnosis not present

## 2021-12-05 DIAGNOSIS — M25551 Pain in right hip: Secondary | ICD-10-CM | POA: Diagnosis not present

## 2021-12-05 DIAGNOSIS — I5023 Acute on chronic systolic (congestive) heart failure: Secondary | ICD-10-CM | POA: Diagnosis not present

## 2021-12-07 DIAGNOSIS — N186 End stage renal disease: Secondary | ICD-10-CM | POA: Diagnosis not present

## 2021-12-07 DIAGNOSIS — Z992 Dependence on renal dialysis: Secondary | ICD-10-CM | POA: Diagnosis not present

## 2021-12-07 DIAGNOSIS — N2581 Secondary hyperparathyroidism of renal origin: Secondary | ICD-10-CM | POA: Diagnosis not present

## 2021-12-09 DIAGNOSIS — N2581 Secondary hyperparathyroidism of renal origin: Secondary | ICD-10-CM | POA: Diagnosis not present

## 2021-12-09 DIAGNOSIS — Z992 Dependence on renal dialysis: Secondary | ICD-10-CM | POA: Diagnosis not present

## 2021-12-09 DIAGNOSIS — N186 End stage renal disease: Secondary | ICD-10-CM | POA: Diagnosis not present

## 2021-12-10 ENCOUNTER — Ambulatory Visit (HOSPITAL_COMMUNITY)
Admission: RE | Admit: 2021-12-10 | Discharge: 2021-12-10 | Disposition: A | Discharge status: Home / Self Care | Payer: BC Managed Care – PPO | Source: Ambulatory Visit | Attending: Physician Assistant | Admitting: Physician Assistant

## 2021-12-10 ENCOUNTER — Ambulatory Visit (INDEPENDENT_AMBULATORY_CARE_PROVIDER_SITE_OTHER): Payer: BC Managed Care – PPO | Admitting: Physician Assistant

## 2021-12-10 VITALS — BP 108/62 | HR 81 | Temp 98.1°F | Resp 20

## 2021-12-10 DIAGNOSIS — N186 End stage renal disease: Secondary | ICD-10-CM

## 2021-12-10 DIAGNOSIS — N185 Chronic kidney disease, stage 5: Secondary | ICD-10-CM | POA: Diagnosis not present

## 2021-12-10 DIAGNOSIS — Z992 Dependence on renal dialysis: Secondary | ICD-10-CM | POA: Diagnosis not present

## 2021-12-10 NOTE — Progress Notes (Signed)
? ? ?  Postoperative Access Visit ? ? ?History of Present Illness  ? ?Benjamin [name] is a 66 y.o. year old male who presents for postoperative follow-up for: left brachiocephalic arteriovenous fistula (Date: 08/2021).  The patient's wounds are healed.  The patient denies steal symptoms.  The patient is able to complete their activities of daily living.  He is currently dialyzing via right IJ [facility] on a Monday Wednesday Friday schedule at the [facility] kidney center. ? ? ?Physical Examination  ? ?Vitals:  ? 12/10/21 1212  ?BP: 108/62  ?Pulse: 81  ?Resp: 20  ?Temp: 98.1 ?F (36.7 ?C)  ?TempSrc: Temporal  ?SpO2: 97%  ? ?There is no height or weight on file to calculate BMI. ? ?left arm Incision is healed, palpable radial pulse, hand grip is 5/5, sensation in digits is intact, palpable thrill, bruit can be auscultated   ? ? ?Medical Decision Making  ? ?Benjamin [name] is a 66 y.o. year old male who presents s/p left brachiocephalic arteriovenous fistula ? ?Patent L brachiocephalic fistula without signs or symptoms of steal syndrome ?The patient's access will be ready for use on Monday 12/14/21 ?The patient's tunneled dialysis catheter can be removed when Nephrology is comfortable with the performance of the fistula ?The patient may follow up on a prn basis ? ? ?Dagoberto Ligas PA-C ?[facility] [place] ?Office: [phone] ? ?Clinic MD: Scot Dock ? ?

## 2021-12-11 DIAGNOSIS — N2581 Secondary hyperparathyroidism of renal origin: Secondary | ICD-10-CM | POA: Diagnosis not present

## 2021-12-11 DIAGNOSIS — N186 End stage renal disease: Secondary | ICD-10-CM | POA: Diagnosis not present

## 2021-12-11 DIAGNOSIS — Z992 Dependence on renal dialysis: Secondary | ICD-10-CM | POA: Diagnosis not present

## 2021-12-14 DIAGNOSIS — N186 End stage renal disease: Secondary | ICD-10-CM | POA: Diagnosis not present

## 2021-12-14 DIAGNOSIS — N2581 Secondary hyperparathyroidism of renal origin: Secondary | ICD-10-CM | POA: Diagnosis not present

## 2021-12-14 DIAGNOSIS — Z992 Dependence on renal dialysis: Secondary | ICD-10-CM | POA: Diagnosis not present

## 2021-12-16 DIAGNOSIS — Z992 Dependence on renal dialysis: Secondary | ICD-10-CM | POA: Diagnosis not present

## 2021-12-16 DIAGNOSIS — N2581 Secondary hyperparathyroidism of renal origin: Secondary | ICD-10-CM | POA: Diagnosis not present

## 2021-12-16 DIAGNOSIS — N186 End stage renal disease: Secondary | ICD-10-CM | POA: Diagnosis not present

## 2021-12-18 DIAGNOSIS — N186 End stage renal disease: Secondary | ICD-10-CM | POA: Diagnosis not present

## 2021-12-18 DIAGNOSIS — Z992 Dependence on renal dialysis: Secondary | ICD-10-CM | POA: Diagnosis not present

## 2021-12-18 DIAGNOSIS — N2581 Secondary hyperparathyroidism of renal origin: Secondary | ICD-10-CM | POA: Diagnosis not present

## 2021-12-21 DIAGNOSIS — Z992 Dependence on renal dialysis: Secondary | ICD-10-CM | POA: Diagnosis not present

## 2021-12-21 DIAGNOSIS — N2581 Secondary hyperparathyroidism of renal origin: Secondary | ICD-10-CM | POA: Diagnosis not present

## 2021-12-21 DIAGNOSIS — N186 End stage renal disease: Secondary | ICD-10-CM | POA: Diagnosis not present

## 2021-12-23 DIAGNOSIS — N2581 Secondary hyperparathyroidism of renal origin: Secondary | ICD-10-CM | POA: Diagnosis not present

## 2021-12-23 DIAGNOSIS — N186 End stage renal disease: Secondary | ICD-10-CM | POA: Diagnosis not present

## 2021-12-23 DIAGNOSIS — Z992 Dependence on renal dialysis: Secondary | ICD-10-CM | POA: Diagnosis not present

## 2021-12-25 DIAGNOSIS — N186 End stage renal disease: Secondary | ICD-10-CM | POA: Diagnosis not present

## 2021-12-25 DIAGNOSIS — N2581 Secondary hyperparathyroidism of renal origin: Secondary | ICD-10-CM | POA: Diagnosis not present

## 2021-12-25 DIAGNOSIS — Z992 Dependence on renal dialysis: Secondary | ICD-10-CM | POA: Diagnosis not present

## 2021-12-28 DIAGNOSIS — Z992 Dependence on renal dialysis: Secondary | ICD-10-CM | POA: Diagnosis not present

## 2021-12-28 DIAGNOSIS — N2581 Secondary hyperparathyroidism of renal origin: Secondary | ICD-10-CM | POA: Diagnosis not present

## 2021-12-28 DIAGNOSIS — E8779 Other fluid overload: Secondary | ICD-10-CM | POA: Diagnosis not present

## 2021-12-28 DIAGNOSIS — N186 End stage renal disease: Secondary | ICD-10-CM | POA: Diagnosis not present

## 2021-12-29 DIAGNOSIS — N2581 Secondary hyperparathyroidism of renal origin: Secondary | ICD-10-CM | POA: Diagnosis not present

## 2021-12-29 DIAGNOSIS — E8779 Other fluid overload: Secondary | ICD-10-CM | POA: Diagnosis not present

## 2021-12-29 DIAGNOSIS — N186 End stage renal disease: Secondary | ICD-10-CM | POA: Diagnosis not present

## 2021-12-29 DIAGNOSIS — Z992 Dependence on renal dialysis: Secondary | ICD-10-CM | POA: Diagnosis not present

## 2021-12-30 DIAGNOSIS — N2581 Secondary hyperparathyroidism of renal origin: Secondary | ICD-10-CM | POA: Diagnosis not present

## 2021-12-30 DIAGNOSIS — E8779 Other fluid overload: Secondary | ICD-10-CM | POA: Diagnosis not present

## 2021-12-30 DIAGNOSIS — N186 End stage renal disease: Secondary | ICD-10-CM | POA: Diagnosis not present

## 2021-12-30 DIAGNOSIS — Z992 Dependence on renal dialysis: Secondary | ICD-10-CM | POA: Diagnosis not present

## 2022-01-01 DIAGNOSIS — Z992 Dependence on renal dialysis: Secondary | ICD-10-CM | POA: Diagnosis not present

## 2022-01-01 DIAGNOSIS — N186 End stage renal disease: Secondary | ICD-10-CM | POA: Diagnosis not present

## 2022-01-01 DIAGNOSIS — N2581 Secondary hyperparathyroidism of renal origin: Secondary | ICD-10-CM | POA: Diagnosis not present

## 2022-01-01 DIAGNOSIS — E8779 Other fluid overload: Secondary | ICD-10-CM | POA: Diagnosis not present

## 2022-01-03 DIAGNOSIS — N186 End stage renal disease: Secondary | ICD-10-CM | POA: Diagnosis not present

## 2022-01-03 DIAGNOSIS — Z992 Dependence on renal dialysis: Secondary | ICD-10-CM | POA: Diagnosis not present

## 2022-01-03 DIAGNOSIS — I129 Hypertensive chronic kidney disease with stage 1 through stage 4 chronic kidney disease, or unspecified chronic kidney disease: Secondary | ICD-10-CM | POA: Diagnosis not present

## 2022-01-04 DIAGNOSIS — N186 End stage renal disease: Secondary | ICD-10-CM | POA: Diagnosis not present

## 2022-01-04 DIAGNOSIS — N2581 Secondary hyperparathyroidism of renal origin: Secondary | ICD-10-CM | POA: Diagnosis not present

## 2022-01-04 DIAGNOSIS — M25551 Pain in right hip: Secondary | ICD-10-CM | POA: Diagnosis not present

## 2022-01-04 DIAGNOSIS — I5023 Acute on chronic systolic (congestive) heart failure: Secondary | ICD-10-CM | POA: Diagnosis not present

## 2022-01-04 DIAGNOSIS — R531 Weakness: Secondary | ICD-10-CM | POA: Diagnosis not present

## 2022-01-04 DIAGNOSIS — Z992 Dependence on renal dialysis: Secondary | ICD-10-CM | POA: Diagnosis not present

## 2022-01-06 DIAGNOSIS — N186 End stage renal disease: Secondary | ICD-10-CM | POA: Diagnosis not present

## 2022-01-06 DIAGNOSIS — Z23 Encounter for immunization: Secondary | ICD-10-CM | POA: Insufficient documentation

## 2022-01-06 DIAGNOSIS — N2581 Secondary hyperparathyroidism of renal origin: Secondary | ICD-10-CM | POA: Diagnosis not present

## 2022-01-06 DIAGNOSIS — Z992 Dependence on renal dialysis: Secondary | ICD-10-CM | POA: Diagnosis not present

## 2022-01-08 DIAGNOSIS — Z992 Dependence on renal dialysis: Secondary | ICD-10-CM | POA: Diagnosis not present

## 2022-01-08 DIAGNOSIS — N2581 Secondary hyperparathyroidism of renal origin: Secondary | ICD-10-CM | POA: Diagnosis not present

## 2022-01-08 DIAGNOSIS — N186 End stage renal disease: Secondary | ICD-10-CM | POA: Diagnosis not present

## 2022-01-11 DIAGNOSIS — N2581 Secondary hyperparathyroidism of renal origin: Secondary | ICD-10-CM | POA: Diagnosis not present

## 2022-01-11 DIAGNOSIS — N186 End stage renal disease: Secondary | ICD-10-CM | POA: Diagnosis not present

## 2022-01-11 DIAGNOSIS — Z992 Dependence on renal dialysis: Secondary | ICD-10-CM | POA: Diagnosis not present

## 2022-01-11 DIAGNOSIS — L299 Pruritus, unspecified: Secondary | ICD-10-CM | POA: Diagnosis not present

## 2022-01-13 DIAGNOSIS — N186 End stage renal disease: Secondary | ICD-10-CM | POA: Diagnosis not present

## 2022-01-13 DIAGNOSIS — N2581 Secondary hyperparathyroidism of renal origin: Secondary | ICD-10-CM | POA: Diagnosis not present

## 2022-01-13 DIAGNOSIS — Z992 Dependence on renal dialysis: Secondary | ICD-10-CM | POA: Diagnosis not present

## 2022-01-13 DIAGNOSIS — L299 Pruritus, unspecified: Secondary | ICD-10-CM | POA: Diagnosis not present

## 2022-01-15 DIAGNOSIS — L299 Pruritus, unspecified: Secondary | ICD-10-CM | POA: Insufficient documentation

## 2022-01-15 DIAGNOSIS — Z992 Dependence on renal dialysis: Secondary | ICD-10-CM | POA: Diagnosis not present

## 2022-01-15 DIAGNOSIS — N2581 Secondary hyperparathyroidism of renal origin: Secondary | ICD-10-CM | POA: Diagnosis not present

## 2022-01-15 DIAGNOSIS — N186 End stage renal disease: Secondary | ICD-10-CM | POA: Diagnosis not present

## 2022-01-18 DIAGNOSIS — Z992 Dependence on renal dialysis: Secondary | ICD-10-CM | POA: Diagnosis not present

## 2022-01-18 DIAGNOSIS — E8779 Other fluid overload: Secondary | ICD-10-CM | POA: Diagnosis not present

## 2022-01-18 DIAGNOSIS — N2581 Secondary hyperparathyroidism of renal origin: Secondary | ICD-10-CM | POA: Diagnosis not present

## 2022-01-18 DIAGNOSIS — N186 End stage renal disease: Secondary | ICD-10-CM | POA: Diagnosis not present

## 2022-01-20 DIAGNOSIS — E8779 Other fluid overload: Secondary | ICD-10-CM | POA: Diagnosis not present

## 2022-01-20 DIAGNOSIS — Z992 Dependence on renal dialysis: Secondary | ICD-10-CM | POA: Diagnosis not present

## 2022-01-20 DIAGNOSIS — N2581 Secondary hyperparathyroidism of renal origin: Secondary | ICD-10-CM | POA: Diagnosis not present

## 2022-01-20 DIAGNOSIS — N186 End stage renal disease: Secondary | ICD-10-CM | POA: Diagnosis not present

## 2022-01-22 DIAGNOSIS — E8779 Other fluid overload: Secondary | ICD-10-CM | POA: Diagnosis not present

## 2022-01-22 DIAGNOSIS — N2581 Secondary hyperparathyroidism of renal origin: Secondary | ICD-10-CM | POA: Diagnosis not present

## 2022-01-22 DIAGNOSIS — N186 End stage renal disease: Secondary | ICD-10-CM | POA: Diagnosis not present

## 2022-01-22 DIAGNOSIS — Z992 Dependence on renal dialysis: Secondary | ICD-10-CM | POA: Diagnosis not present

## 2022-01-23 DIAGNOSIS — Z992 Dependence on renal dialysis: Secondary | ICD-10-CM | POA: Diagnosis not present

## 2022-01-23 DIAGNOSIS — N186 End stage renal disease: Secondary | ICD-10-CM | POA: Diagnosis not present

## 2022-01-23 DIAGNOSIS — E8779 Other fluid overload: Secondary | ICD-10-CM | POA: Diagnosis not present

## 2022-01-23 DIAGNOSIS — N2581 Secondary hyperparathyroidism of renal origin: Secondary | ICD-10-CM | POA: Diagnosis not present

## 2022-01-25 DIAGNOSIS — Z992 Dependence on renal dialysis: Secondary | ICD-10-CM | POA: Diagnosis not present

## 2022-01-25 DIAGNOSIS — N2581 Secondary hyperparathyroidism of renal origin: Secondary | ICD-10-CM | POA: Diagnosis not present

## 2022-01-25 DIAGNOSIS — N186 End stage renal disease: Secondary | ICD-10-CM | POA: Diagnosis not present

## 2022-01-27 DIAGNOSIS — N186 End stage renal disease: Secondary | ICD-10-CM | POA: Diagnosis not present

## 2022-01-27 DIAGNOSIS — N2581 Secondary hyperparathyroidism of renal origin: Secondary | ICD-10-CM | POA: Diagnosis not present

## 2022-01-27 DIAGNOSIS — Z992 Dependence on renal dialysis: Secondary | ICD-10-CM | POA: Diagnosis not present

## 2022-01-28 ENCOUNTER — Encounter (HOSPITAL_COMMUNITY): Payer: BC Managed Care – PPO

## 2022-01-28 ENCOUNTER — Other Ambulatory Visit: Payer: Self-pay

## 2022-01-28 ENCOUNTER — Ambulatory Visit: Payer: BC Managed Care – PPO | Admitting: Vascular Surgery

## 2022-01-28 DIAGNOSIS — N186 End stage renal disease: Secondary | ICD-10-CM

## 2022-01-29 DIAGNOSIS — N186 End stage renal disease: Secondary | ICD-10-CM | POA: Diagnosis not present

## 2022-01-29 DIAGNOSIS — N2581 Secondary hyperparathyroidism of renal origin: Secondary | ICD-10-CM | POA: Diagnosis not present

## 2022-01-29 DIAGNOSIS — Z992 Dependence on renal dialysis: Secondary | ICD-10-CM | POA: Diagnosis not present

## 2022-02-03 DIAGNOSIS — N186 End stage renal disease: Secondary | ICD-10-CM | POA: Diagnosis not present

## 2022-02-03 DIAGNOSIS — I129 Hypertensive chronic kidney disease with stage 1 through stage 4 chronic kidney disease, or unspecified chronic kidney disease: Secondary | ICD-10-CM | POA: Diagnosis not present

## 2022-02-03 DIAGNOSIS — N2581 Secondary hyperparathyroidism of renal origin: Secondary | ICD-10-CM | POA: Diagnosis not present

## 2022-02-03 DIAGNOSIS — Z992 Dependence on renal dialysis: Secondary | ICD-10-CM | POA: Diagnosis not present

## 2022-02-04 DIAGNOSIS — R531 Weakness: Secondary | ICD-10-CM | POA: Diagnosis not present

## 2022-02-04 DIAGNOSIS — N186 End stage renal disease: Secondary | ICD-10-CM | POA: Diagnosis not present

## 2022-02-04 DIAGNOSIS — Z992 Dependence on renal dialysis: Secondary | ICD-10-CM | POA: Diagnosis not present

## 2022-02-04 DIAGNOSIS — M25551 Pain in right hip: Secondary | ICD-10-CM | POA: Diagnosis not present

## 2022-02-04 DIAGNOSIS — N2581 Secondary hyperparathyroidism of renal origin: Secondary | ICD-10-CM | POA: Diagnosis not present

## 2022-02-04 DIAGNOSIS — I5023 Acute on chronic systolic (congestive) heart failure: Secondary | ICD-10-CM | POA: Diagnosis not present

## 2022-02-05 DIAGNOSIS — N2581 Secondary hyperparathyroidism of renal origin: Secondary | ICD-10-CM | POA: Diagnosis not present

## 2022-02-05 DIAGNOSIS — Z992 Dependence on renal dialysis: Secondary | ICD-10-CM | POA: Diagnosis not present

## 2022-02-05 DIAGNOSIS — N186 End stage renal disease: Secondary | ICD-10-CM | POA: Diagnosis not present

## 2022-02-08 DIAGNOSIS — N186 End stage renal disease: Secondary | ICD-10-CM | POA: Diagnosis not present

## 2022-02-08 DIAGNOSIS — N2581 Secondary hyperparathyroidism of renal origin: Secondary | ICD-10-CM | POA: Diagnosis not present

## 2022-02-08 DIAGNOSIS — Z992 Dependence on renal dialysis: Secondary | ICD-10-CM | POA: Diagnosis not present

## 2022-02-10 DIAGNOSIS — N2581 Secondary hyperparathyroidism of renal origin: Secondary | ICD-10-CM | POA: Diagnosis not present

## 2022-02-10 DIAGNOSIS — N186 End stage renal disease: Secondary | ICD-10-CM | POA: Diagnosis not present

## 2022-02-10 DIAGNOSIS — Z992 Dependence on renal dialysis: Secondary | ICD-10-CM | POA: Diagnosis not present

## 2022-02-12 DIAGNOSIS — Z992 Dependence on renal dialysis: Secondary | ICD-10-CM | POA: Diagnosis not present

## 2022-02-12 DIAGNOSIS — N2581 Secondary hyperparathyroidism of renal origin: Secondary | ICD-10-CM | POA: Diagnosis not present

## 2022-02-12 DIAGNOSIS — N186 End stage renal disease: Secondary | ICD-10-CM | POA: Diagnosis not present

## 2022-02-15 DIAGNOSIS — N2581 Secondary hyperparathyroidism of renal origin: Secondary | ICD-10-CM | POA: Diagnosis not present

## 2022-02-15 DIAGNOSIS — N186 End stage renal disease: Secondary | ICD-10-CM | POA: Diagnosis not present

## 2022-02-15 DIAGNOSIS — R188 Other ascites: Secondary | ICD-10-CM | POA: Insufficient documentation

## 2022-02-15 DIAGNOSIS — Z992 Dependence on renal dialysis: Secondary | ICD-10-CM | POA: Diagnosis not present

## 2022-02-16 ENCOUNTER — Other Ambulatory Visit (HOSPITAL_COMMUNITY): Payer: Self-pay | Admitting: Nurse Practitioner

## 2022-02-16 ENCOUNTER — Other Ambulatory Visit: Payer: Self-pay | Admitting: Nurse Practitioner

## 2022-02-16 DIAGNOSIS — R188 Other ascites: Secondary | ICD-10-CM

## 2022-02-17 DIAGNOSIS — Z992 Dependence on renal dialysis: Secondary | ICD-10-CM | POA: Diagnosis not present

## 2022-02-17 DIAGNOSIS — N186 End stage renal disease: Secondary | ICD-10-CM | POA: Diagnosis not present

## 2022-02-17 DIAGNOSIS — N2581 Secondary hyperparathyroidism of renal origin: Secondary | ICD-10-CM | POA: Diagnosis not present

## 2022-02-19 DIAGNOSIS — N2581 Secondary hyperparathyroidism of renal origin: Secondary | ICD-10-CM | POA: Diagnosis not present

## 2022-02-19 DIAGNOSIS — Z992 Dependence on renal dialysis: Secondary | ICD-10-CM | POA: Diagnosis not present

## 2022-02-19 DIAGNOSIS — N186 End stage renal disease: Secondary | ICD-10-CM | POA: Diagnosis not present

## 2022-02-22 DIAGNOSIS — Z992 Dependence on renal dialysis: Secondary | ICD-10-CM | POA: Diagnosis not present

## 2022-02-22 DIAGNOSIS — N2581 Secondary hyperparathyroidism of renal origin: Secondary | ICD-10-CM | POA: Diagnosis not present

## 2022-02-22 DIAGNOSIS — N186 End stage renal disease: Secondary | ICD-10-CM | POA: Diagnosis not present

## 2022-02-23 ENCOUNTER — Ambulatory Visit (HOSPITAL_COMMUNITY)
Admission: RE | Admit: 2022-02-23 | Discharge: 2022-02-23 | Disposition: A | Discharge status: Home / Self Care | Payer: BC Managed Care – PPO | Source: Ambulatory Visit | Attending: Nurse Practitioner | Admitting: Nurse Practitioner

## 2022-02-23 DIAGNOSIS — R188 Other ascites: Secondary | ICD-10-CM | POA: Diagnosis not present

## 2022-02-23 HISTORY — PX: IR PARACENTESIS: IMG2679

## 2022-02-23 MED ORDER — LIDOCAINE HCL 1 % IJ SOLN
INTRAMUSCULAR | Status: AC
Start: 2022-02-23 — End: 2022-02-23
  Administered 2022-02-23: 10 mL
  Filled 2022-02-23: qty 20

## 2022-02-23 NOTE — Procedures (Signed)
PROCEDURE SUMMARY:  Successful image-guided paracentesis from the right lower abdomen.  Yielded 3.0 liters of thin yellow fluid.  No immediate complications.  EBL <5 mL. Patient tolerated well.   Specimen was not sent for labs.  Please see imaging section of Epic for full dictation.   Lura Em PA-C 02/23/2022 10:44 AM

## 2022-02-24 DIAGNOSIS — Z992 Dependence on renal dialysis: Secondary | ICD-10-CM | POA: Diagnosis not present

## 2022-02-24 DIAGNOSIS — N2581 Secondary hyperparathyroidism of renal origin: Secondary | ICD-10-CM | POA: Diagnosis not present

## 2022-02-24 DIAGNOSIS — N186 End stage renal disease: Secondary | ICD-10-CM | POA: Diagnosis not present

## 2022-02-26 DIAGNOSIS — N2581 Secondary hyperparathyroidism of renal origin: Secondary | ICD-10-CM | POA: Diagnosis not present

## 2022-02-26 DIAGNOSIS — N186 End stage renal disease: Secondary | ICD-10-CM | POA: Diagnosis not present

## 2022-02-26 DIAGNOSIS — Z992 Dependence on renal dialysis: Secondary | ICD-10-CM | POA: Diagnosis not present

## 2022-03-01 DIAGNOSIS — N186 End stage renal disease: Secondary | ICD-10-CM | POA: Diagnosis not present

## 2022-03-01 DIAGNOSIS — Z992 Dependence on renal dialysis: Secondary | ICD-10-CM | POA: Diagnosis not present

## 2022-03-01 DIAGNOSIS — N2581 Secondary hyperparathyroidism of renal origin: Secondary | ICD-10-CM | POA: Diagnosis not present

## 2022-03-03 DIAGNOSIS — N186 End stage renal disease: Secondary | ICD-10-CM | POA: Diagnosis not present

## 2022-03-03 DIAGNOSIS — N2581 Secondary hyperparathyroidism of renal origin: Secondary | ICD-10-CM | POA: Diagnosis not present

## 2022-03-03 DIAGNOSIS — Z992 Dependence on renal dialysis: Secondary | ICD-10-CM | POA: Diagnosis not present

## 2022-03-05 DIAGNOSIS — Z992 Dependence on renal dialysis: Secondary | ICD-10-CM | POA: Diagnosis not present

## 2022-03-05 DIAGNOSIS — N2581 Secondary hyperparathyroidism of renal origin: Secondary | ICD-10-CM | POA: Diagnosis not present

## 2022-03-05 DIAGNOSIS — I129 Hypertensive chronic kidney disease with stage 1 through stage 4 chronic kidney disease, or unspecified chronic kidney disease: Secondary | ICD-10-CM | POA: Diagnosis not present

## 2022-03-05 DIAGNOSIS — N186 End stage renal disease: Secondary | ICD-10-CM | POA: Diagnosis not present

## 2022-03-06 DIAGNOSIS — N186 End stage renal disease: Secondary | ICD-10-CM | POA: Diagnosis not present

## 2022-03-06 DIAGNOSIS — I5023 Acute on chronic systolic (congestive) heart failure: Secondary | ICD-10-CM | POA: Diagnosis not present

## 2022-03-06 DIAGNOSIS — R531 Weakness: Secondary | ICD-10-CM | POA: Diagnosis not present

## 2022-03-06 DIAGNOSIS — M25551 Pain in right hip: Secondary | ICD-10-CM | POA: Diagnosis not present

## 2022-03-10 DIAGNOSIS — N2581 Secondary hyperparathyroidism of renal origin: Secondary | ICD-10-CM | POA: Diagnosis not present

## 2022-03-10 DIAGNOSIS — N186 End stage renal disease: Secondary | ICD-10-CM | POA: Diagnosis not present

## 2022-03-10 DIAGNOSIS — Z992 Dependence on renal dialysis: Secondary | ICD-10-CM | POA: Diagnosis not present

## 2022-03-12 DIAGNOSIS — N2581 Secondary hyperparathyroidism of renal origin: Secondary | ICD-10-CM | POA: Diagnosis not present

## 2022-03-12 DIAGNOSIS — N186 End stage renal disease: Secondary | ICD-10-CM | POA: Diagnosis not present

## 2022-03-12 DIAGNOSIS — Z992 Dependence on renal dialysis: Secondary | ICD-10-CM | POA: Diagnosis not present

## 2022-03-15 ENCOUNTER — Other Ambulatory Visit: Payer: Self-pay | Admitting: Internal Medicine

## 2022-03-15 ENCOUNTER — Other Ambulatory Visit (HOSPITAL_COMMUNITY): Payer: Self-pay | Admitting: Internal Medicine

## 2022-03-15 DIAGNOSIS — Z992 Dependence on renal dialysis: Secondary | ICD-10-CM | POA: Diagnosis not present

## 2022-03-15 DIAGNOSIS — R188 Other ascites: Secondary | ICD-10-CM

## 2022-03-15 DIAGNOSIS — N186 End stage renal disease: Secondary | ICD-10-CM | POA: Diagnosis not present

## 2022-03-15 DIAGNOSIS — N2581 Secondary hyperparathyroidism of renal origin: Secondary | ICD-10-CM | POA: Diagnosis not present

## 2022-03-17 DIAGNOSIS — N2581 Secondary hyperparathyroidism of renal origin: Secondary | ICD-10-CM | POA: Diagnosis not present

## 2022-03-17 DIAGNOSIS — Z992 Dependence on renal dialysis: Secondary | ICD-10-CM | POA: Diagnosis not present

## 2022-03-17 DIAGNOSIS — N186 End stage renal disease: Secondary | ICD-10-CM | POA: Diagnosis not present

## 2022-03-18 ENCOUNTER — Ambulatory Visit (HOSPITAL_COMMUNITY)
Admission: RE | Admit: 2022-03-18 | Discharge: 2022-03-18 | Disposition: A | Discharge status: Home / Self Care | Payer: BC Managed Care – PPO | Source: Ambulatory Visit | Attending: Internal Medicine | Admitting: Internal Medicine

## 2022-03-18 DIAGNOSIS — R188 Other ascites: Secondary | ICD-10-CM | POA: Insufficient documentation

## 2022-03-18 DIAGNOSIS — N185 Chronic kidney disease, stage 5: Secondary | ICD-10-CM | POA: Diagnosis not present

## 2022-03-18 HISTORY — PX: IR PARACENTESIS: IMG2679

## 2022-03-18 MED ORDER — LIDOCAINE HCL 1 % IJ SOLN
INTRAMUSCULAR | Status: AC
  Filled 2022-03-18: qty 20

## 2022-03-18 NOTE — Procedures (Signed)
PROCEDURE SUMMARY:  Successful US guided therapeutic paracentesis from LLQ.  Yielded 5.9  of clear, yellow fluid.  No immediate complications.  Pt tolerated well.   Specimen not sent for labs.  EBL < 1 mL  Tyson Alias, AGNP 03/18/2022 10:42 AM

## 2022-03-19 DIAGNOSIS — N186 End stage renal disease: Secondary | ICD-10-CM | POA: Diagnosis not present

## 2022-03-19 DIAGNOSIS — Z992 Dependence on renal dialysis: Secondary | ICD-10-CM | POA: Diagnosis not present

## 2022-03-19 DIAGNOSIS — N2581 Secondary hyperparathyroidism of renal origin: Secondary | ICD-10-CM | POA: Diagnosis not present

## 2022-03-22 DIAGNOSIS — N2581 Secondary hyperparathyroidism of renal origin: Secondary | ICD-10-CM | POA: Diagnosis not present

## 2022-03-22 DIAGNOSIS — N186 End stage renal disease: Secondary | ICD-10-CM | POA: Diagnosis not present

## 2022-03-22 DIAGNOSIS — Z992 Dependence on renal dialysis: Secondary | ICD-10-CM | POA: Diagnosis not present

## 2022-03-24 DIAGNOSIS — N186 End stage renal disease: Secondary | ICD-10-CM | POA: Diagnosis not present

## 2022-03-24 DIAGNOSIS — N2581 Secondary hyperparathyroidism of renal origin: Secondary | ICD-10-CM | POA: Diagnosis not present

## 2022-03-24 DIAGNOSIS — Z992 Dependence on renal dialysis: Secondary | ICD-10-CM | POA: Diagnosis not present

## 2022-03-25 NOTE — Progress Notes (Signed)
Office Note     CC:  ESRD Requesting Provider:  Mateo Flow, MD  HPI: Benjamin [name] is a Right handed 66 y.o. (05-Feb-1956) male with end-stage renal disease currently on dialysis status [name] left brachiocephalic fistula creation on 08/11/2021.    He presents today at the request of his nephrologist with fistula function - His fistula has been difficult to cannulate after infiltration episode.  On exam Benjamin [name] was doing well, accompanied by his son.  His fistula is working well until recent infiltration.  Since that time, they have been unable to cannulate.  He has been using a right-sided [facility] for dialysis Monday Wednesday Friday.  Denies symptoms associated with steal syndrome.  The pt is not on a statin for cholesterol management.  The pt is not on a daily aspirin.   Other AC:  - The pt is  on medications for hypertension.   The pt is  diabetic. Tobacco hx:  smokeless  Past Medical History:  Diagnosis Date   AKI (acute kidney injury) ([place]) 02/2020   stage 4   CHF (congestive heart failure) (HCC)    Diabetes mellitus without complication (HCC)    diet controlled   Dyspnea    Hypertension     Past Surgical History:  Procedure Laterality Date   AV FISTULA PLACEMENT Left 08/11/2021   Procedure: LEFT ARM BRACHIOCEPHALIC ARTERIOVENOUS (AV) FISTULA CREATION;  Surgeon: [name], MD;  Location: [facility] OR;  Service: Vascular;  Laterality: Left;   IR PARACENTESIS  02/23/2022   IR PARACENTESIS  03/18/2022   WISDOM TOOTH EXTRACTION      Social History   Socioeconomic History   Marital status: Married    Spouse name: Not on file   Number of children: Not on file   Years of education: Not on file   Highest education level: Not on file  Occupational History   Not on file  Tobacco Use   Smoking status: Never   Smokeless tobacco: Former    Types: Chew    Quit date: 2021  Vaping Use   Vaping Use: Never used  Substance and Sexual Activity   Alcohol use: Not Currently   Drug  use: Never   Sexual activity: Not on file  Other Topics Concern   Not on file  Social History Narrative   Not on file   Social Determinants of Health   Financial Resource Strain: Not on file  Food Insecurity: Not on file  Transportation Needs: Not on file  Physical Activity: Not on file  Stress: Not on file  Social Connections: Not on file  Intimate Partner Violence: Not on file    Family History  Problem Relation Age of Onset   Stroke Mother    Stroke Father     Current Outpatient Medications  Medication Sig Dispense Refill   albuterol (VENTOLIN HFA) 108 (90 Base) MCG/ACT inhaler Can inhale two puffs every four to six hours as needed for cough, wheeze, shortness of breath, or chest tightness. 8.5 g 1   cholecalciferol (VITAMIN D) 25 MCG (1000 UNIT) tablet Take 1,000 Units by mouth daily.     folic acid-pyridoxine-cyancobalamin (NIVA-FOL) 2.5-25-2 MG TABS tablet Take 1 tablet by mouth in the morning.     hydrALAZINE (APRESOLINE) 10 MG tablet Take 20 mg by mouth every 8 (eight) hours.     hydrocortisone cream 0.5 % Apply topically 2 (two) times daily. (Patient taking differently: Apply 1 application. topically 2 (two) times daily as needed for itching.)  30 g 0   metolazone (ZAROXOLYN) 5 MG tablet Take 5 mg by mouth every 3 (three) days.     SYMBICORT 160-4.5 MCG/ACT inhaler INHALE 2 PUFFS INTO THE LUNGS TWICE DAILY. RINSE, GARGLE, AND SPIT AFTER USE. 10.2 g 5   torsemide (DEMADEX) 20 MG tablet Take 80 mg by mouth 2 (two) times daily.     traMADol (ULTRAM) 50 MG tablet Take 1 tablet (50 mg total) by mouth every 6 (six) hours as needed for severe pain or moderate pain. 8 tablet 0   No current facility-administered medications for this visit.    Allergies  Allergen Reactions   Grass Pollen(K-O-R-T-Swt Vern) Other (See Comments)    Eyes run, shortness of breath, itching    Penicillins     Other reaction(s): Unknown     REVIEW OF SYSTEMS:   '[X]'$  denotes positive finding, [  ] denotes negative finding Cardiac  Comments:  Chest pain or chest pressure:    Shortness of breath upon exertion:    Short of breath when lying flat:    Irregular heart rhythm:        Vascular    Pain in calf, thigh, or hip brought on by ambulation:    Pain in feet at night that wakes you up from your sleep:     Blood clot in your veins:    Leg swelling:         Pulmonary    Oxygen at home:    Productive cough:     Wheezing:         Neurologic    Sudden weakness in arms or legs:     Sudden numbness in arms or legs:     Sudden onset of difficulty speaking or slurred speech:    Temporary loss of vision in one eye:     Problems with dizziness:         Gastrointestinal    Blood in stool:     Vomited blood:         Genitourinary    Burning when urinating:     Blood in urine:        Psychiatric    Major depression:         Hematologic    Bleeding problems:    Problems with blood clotting too easily:        Skin    Rashes or ulcers:        Constitutional    Fever or chills:      PHYSICAL EXAMINATION:  There were no vitals filed for this visit.   General:  WDWN in NAD; vital signs documented above Gait: Not observed HENT: WNL, normocephalic Pulmonary: normal non-labored breathing , without Rales, rhonchi,  wheezing Cardiac: regular HR,  Abdomen: soft, NT, no masses Skin: with rashes on bilateral arms Vascular Exam/Pulses:  Right Left  Radial 2+ (normal) 2+ (normal)  Ulnar 2+ (normal) 2+ (normal)                   Extremities: without ischemic changes, without Gangrene , without cellulitis; without open wounds;  Musculoskeletal: no muscle wasting or atrophy  Neurologic: A&O X 3;  No focal weakness or paresthesias are detected Psychiatric:  The pt has Normal affect.   Non-Invasive Vascular Imaging:   Summary:  Patent left brachio cephalic AVF.  Branches noted as above.  No stenosis or thrombus seen.  Velocities <100 cm/s in the proximal and mid  upper arm.     ASSESSMENT/PLAN:  Benjamin [name] is a 66 y.o. male who presents with fissure malfunction after recent filtration episode.  Fistula duplex ultrasound reviewed demonstrating widely patent fistula with acceptable flows, size, depth proximally.  More distally, there are branches appreciated and slower flows.  I had a long conversation with Benjamin [name] regarding the above.  He would be best served with fistulogram to define flow through the fistula, and likely subsequent branch ligation in an effort to improve flows throughout the fistula.  After discussing the risk and benefits of left arm fistulogram with possible intervention, Benjamin [name] elected to proceed.  We will pursue this on Wednesday as a first case prior to dialysis.  [name], MD [facility] [phone]

## 2022-03-26 ENCOUNTER — Encounter: Payer: Self-pay | Admitting: Vascular Surgery

## 2022-03-26 ENCOUNTER — Ambulatory Visit (HOSPITAL_COMMUNITY)
Admission: RE | Admit: 2022-03-26 | Discharge: 2022-03-26 | Disposition: A | Discharge status: Home / Self Care | Payer: BC Managed Care – PPO | Source: Ambulatory Visit | Attending: Vascular Surgery | Admitting: Vascular Surgery

## 2022-03-26 ENCOUNTER — Ambulatory Visit (INDEPENDENT_AMBULATORY_CARE_PROVIDER_SITE_OTHER): Payer: BC Managed Care – PPO | Admitting: Vascular Surgery

## 2022-03-26 ENCOUNTER — Other Ambulatory Visit: Payer: Self-pay

## 2022-03-26 VITALS — BP 105/62 | HR 72 | Temp 98.3°F | Resp 20 | Ht 72.0 in | Wt 195.0 lb

## 2022-03-26 DIAGNOSIS — N2581 Secondary hyperparathyroidism of renal origin: Secondary | ICD-10-CM | POA: Diagnosis not present

## 2022-03-26 DIAGNOSIS — N186 End stage renal disease: Secondary | ICD-10-CM

## 2022-03-26 DIAGNOSIS — Z992 Dependence on renal dialysis: Secondary | ICD-10-CM

## 2022-03-29 DIAGNOSIS — N2581 Secondary hyperparathyroidism of renal origin: Secondary | ICD-10-CM | POA: Diagnosis not present

## 2022-03-29 DIAGNOSIS — Z992 Dependence on renal dialysis: Secondary | ICD-10-CM | POA: Diagnosis not present

## 2022-03-29 DIAGNOSIS — N186 End stage renal disease: Secondary | ICD-10-CM | POA: Diagnosis not present

## 2022-03-31 ENCOUNTER — Other Ambulatory Visit: Payer: Self-pay

## 2022-03-31 ENCOUNTER — Ambulatory Visit (HOSPITAL_COMMUNITY)
Admission: RE | Admit: 2022-03-31 | Discharge: 2022-03-31 | Disposition: A | Discharge status: Home / Self Care | Payer: BC Managed Care – PPO | Attending: Vascular Surgery | Admitting: Vascular Surgery

## 2022-03-31 ENCOUNTER — Encounter (HOSPITAL_COMMUNITY)
Admission: RE | Disposition: A | Discharge status: Home / Self Care | Payer: Self-pay | Source: Home / Self Care | Attending: Vascular Surgery

## 2022-03-31 ENCOUNTER — Encounter (HOSPITAL_COMMUNITY): Payer: Self-pay | Admitting: Vascular Surgery

## 2022-03-31 DIAGNOSIS — I132 Hypertensive heart and chronic kidney disease with heart failure and with stage 5 chronic kidney disease, or end stage renal disease: Secondary | ICD-10-CM | POA: Insufficient documentation

## 2022-03-31 DIAGNOSIS — I509 Heart failure, unspecified: Secondary | ICD-10-CM | POA: Insufficient documentation

## 2022-03-31 DIAGNOSIS — Y841 Kidney dialysis as the cause of abnormal reaction of the patient, or of later complication, without mention of misadventure at the time of the procedure: Secondary | ICD-10-CM | POA: Diagnosis not present

## 2022-03-31 DIAGNOSIS — T82898A Other specified complication of vascular prosthetic devices, implants and grafts, initial encounter: Secondary | ICD-10-CM | POA: Insufficient documentation

## 2022-03-31 DIAGNOSIS — N2581 Secondary hyperparathyroidism of renal origin: Secondary | ICD-10-CM | POA: Diagnosis not present

## 2022-03-31 DIAGNOSIS — E1122 Type 2 diabetes mellitus with diabetic chronic kidney disease: Secondary | ICD-10-CM | POA: Diagnosis not present

## 2022-03-31 DIAGNOSIS — Z87891 Personal history of nicotine dependence: Secondary | ICD-10-CM | POA: Insufficient documentation

## 2022-03-31 DIAGNOSIS — Z992 Dependence on renal dialysis: Secondary | ICD-10-CM | POA: Insufficient documentation

## 2022-03-31 DIAGNOSIS — N186 End stage renal disease: Secondary | ICD-10-CM | POA: Diagnosis not present

## 2022-03-31 DIAGNOSIS — N185 Chronic kidney disease, stage 5: Secondary | ICD-10-CM | POA: Diagnosis not present

## 2022-03-31 HISTORY — PX: A/V FISTULAGRAM: CATH118298

## 2022-03-31 LAB — POCT I-STAT, CHEM 8
BUN: 31 mg/dL — ABNORMAL HIGH (ref 8–23)
Calcium, Ion: 1.04 mmol/L — ABNORMAL LOW (ref 1.15–1.40)
Chloride: 92 mmol/L — ABNORMAL LOW (ref 98–111)
Creatinine, Ser: 4.2 mg/dL — ABNORMAL HIGH (ref 0.61–1.24)
Glucose, Bld: 77 mg/dL (ref 70–99)
HCT: 36 % — ABNORMAL LOW (ref 39.0–52.0)
Hemoglobin: 12.2 g/dL — ABNORMAL LOW (ref 13.0–17.0)
Potassium: 3.9 mmol/L (ref 3.5–5.1)
Sodium: 126 mmol/L — ABNORMAL LOW (ref 135–145)
TCO2: 25 mmol/L (ref 22–32)

## 2022-03-31 SURGERY — A/V FISTULAGRAM
Anesthesia: LOCAL | Laterality: Left

## 2022-03-31 MED ORDER — IODIXANOL 320 MG/ML IV SOLN
INTRAVENOUS | Status: DC | PRN
  Administered 2022-03-31: 25 mL

## 2022-03-31 MED ORDER — HEPARIN (PORCINE) IN NACL 1000-0.9 UT/500ML-% IV SOLN
INTRAVENOUS | Status: AC
  Filled 2022-03-31: qty 500

## 2022-03-31 MED ORDER — SODIUM CHLORIDE 0.9% FLUSH: 3.0000 mL | Freq: Two times a day (BID) | INTRAVENOUS | Status: DC

## 2022-03-31 MED ORDER — HEPARIN (PORCINE) IN NACL 1000-0.9 UT/500ML-% IV SOLN
INTRAVENOUS | Status: DC | PRN
  Administered 2022-03-31: 500 mL

## 2022-03-31 MED ORDER — LIDOCAINE HCL (PF) 1 % IJ SOLN
INTRAMUSCULAR | Status: DC | PRN
  Administered 2022-03-31: 2 mL via SUBCUTANEOUS

## 2022-03-31 MED ORDER — SODIUM CHLORIDE 0.9 % IV SOLN: 250.0000 mL | INTRAVENOUS | Status: DC | PRN

## 2022-03-31 MED ORDER — SODIUM CHLORIDE 0.9% FLUSH: 3.0000 mL | INTRAVENOUS | Status: DC | PRN

## 2022-03-31 MED ORDER — LIDOCAINE HCL (PF) 1 % IJ SOLN
INTRAMUSCULAR | Status: AC
  Filled 2022-03-31: qty 30

## 2022-03-31 SURGICAL SUPPLY — 9 items

## 2022-03-31 NOTE — Op Note (Signed)
    Patient name: Benjamin [name] MRN: [id number] DOB: 24-Sep-1955 Sex: male  03/31/2022 Pre-operative Diagnosis: Fistula malfunction Post-operative diagnosis:  Same Surgeon:  [name], MD Procedure Performed: 1.  Micropuncture access of the left arm brachiocephalic fistula 2.  Fistulogram  3.  Access site managed with Monocryl suture   Indications: Patient is a 66 year old male status post left brachiocephalic fistula.  This fistula is working well until recently.  Flows were noted.  After discussing the risk and benefits of left arm fistulogram in an effort to assess flow prior to pursuing branch ligation, [name] elected to proceed.  Findings: Widely patent fistula with several branches.  No flow-limiting stenosis appreciated.   Procedure:  The patient was identified in the holding area and taken to room 8.  The patient was then placed supine on the table and prepped and draped in the usual sterile fashion.  A time out was called.  The fistula was accessed using a micropuncture needle.  Fistulogram followed.  See details above.  Access was managed with a single Monocryl suture.   Cassandria Santee, MD [facility] [place] Office: [phone]

## 2022-03-31 NOTE — H&P (Signed)
Office Note   Patient seen and examined in preop holding.  No complaints. No changes to medication history or physical exam since last seen in clinic. After discussing the risks and benefits of fistulagram for fistula malfunction during dialysis, Benjamin [name] elected to proceed.   [name] MD   CC:  ESRD Requesting Provider:  No ref. provider found  HPI: Benjamin [name] is a Right handed 66 y.o. ([birth year]) male with end-stage renal disease currently on dialysis status [name] left brachiocephalic fistula creation on 08/11/2021.    He presents today at the request of his nephrologist with fistula function - His fistula has been difficult to cannulate after infiltration episode.  On exam Benjamin [name] was doing well, accompanied by his son.  His fistula is working well until recent infiltration.  Since that time, they have been unable to cannulate.  He has been using a right-sided [facility] for dialysis Monday Wednesday Friday.  Denies symptoms associated with steal syndrome.  The pt is not on a statin for cholesterol management.  The pt is not on a daily aspirin.   Other AC:  - The pt is  on medications for hypertension.   The pt is  diabetic. Tobacco hx:  smokeless  Past Medical History:  Diagnosis Date   AKI (acute kidney injury) ([place]) 02/2020   stage 4   CHF (congestive heart failure) (HCC)    Diabetes mellitus without complication (HCC)    diet controlled   Dyspnea    Hypertension     Past Surgical History:  Procedure Laterality Date   AV FISTULA PLACEMENT Left 08/11/2021   Procedure: LEFT ARM BRACHIOCEPHALIC ARTERIOVENOUS (AV) FISTULA CREATION;  Surgeon: [name], MD;  Location: [facility] OR;  Service: Vascular;  Laterality: Left;   IR PARACENTESIS  02/23/2022   IR PARACENTESIS  03/18/2022   WISDOM TOOTH EXTRACTION      Social History   Socioeconomic History   Marital status: Married    Spouse name: Not on file   Number of children: Not on file   Years of education: Not  on file   Highest education level: Not on file  Occupational History   Not on file  Tobacco Use   Smoking status: Never   Smokeless tobacco: Former    Types: Chew    Quit date: 2021  Vaping Use   Vaping Use: Never used  Substance and Sexual Activity   Alcohol use: Not Currently   Drug use: Never   Sexual activity: Not on file  Other Topics Concern   Not on file  Social History Narrative   Not on file   Social Determinants of Health   Financial Resource Strain: Not on file  Food Insecurity: Not on file  Transportation Needs: Not on file  Physical Activity: Not on file  Stress: Not on file  Social Connections: Not on file  Intimate Partner Violence: Not on file    Family History  Problem Relation Age of Onset   Stroke Mother    Stroke Father     Current Facility-Administered Medications  Medication Dose Route Frequency Provider Last Rate Last Admin   0.9 %  sodium chloride infusion  250 mL Intravenous PRN [name], MD       sodium chloride flush (NS) 0.9 % injection 3 mL  3 mL Intravenous Q12H [name], MD       sodium chloride flush (NS) 0.9 % injection 3 mL  3 mL Intravenous PRN Virl Cagey,  Carolann Littler, MD        Allergies  Allergen Reactions   Grass Pollen(K-O-R-T-Swt Vern) Other (See Comments)    Eyes run, shortness of breath, itching    Penicillins Other (See Comments)    Unknown reaction     REVIEW OF SYSTEMS:   '[X]'$  denotes positive finding, '[ ]'$  denotes negative finding Cardiac  Comments:  Chest pain or chest pressure:    Shortness of breath upon exertion:    Short of breath when lying flat:    Irregular heart rhythm:        Vascular    Pain in calf, thigh, or hip brought on by ambulation:    Pain in feet at night that wakes you up from your sleep:     Blood clot in your veins:    Leg swelling:         Pulmonary    Oxygen at home:    Productive cough:     Wheezing:         Neurologic    Sudden weakness in arms or legs:     Sudden  numbness in arms or legs:     Sudden onset of difficulty speaking or slurred speech:    Temporary loss of vision in one eye:     Problems with dizziness:         Gastrointestinal    Blood in stool:     Vomited blood:         Genitourinary    Burning when urinating:     Blood in urine:        Psychiatric    Major depression:         Hematologic    Bleeding problems:    Problems with blood clotting too easily:        Skin    Rashes or ulcers:        Constitutional    Fever or chills:      PHYSICAL EXAMINATION:  Vitals:   03/31/22 0649  BP: 113/67  Pulse: 75  Resp: 20  Temp: 97.7 F (36.5 C)  TempSrc: Temporal  SpO2: 99%  Weight: 88.5 kg  Height: 6' (1.829 m)     General:  WDWN in NAD; vital signs documented above Gait: Not observed HENT: WNL, normocephalic Pulmonary: normal non-labored breathing , without Rales, rhonchi,  wheezing Cardiac: regular HR,  Abdomen: soft, NT, no masses Skin: with rashes on bilateral arms Vascular Exam/Pulses:  Right Left  Radial 2+ (normal) 2+ (normal)  Ulnar 2+ (normal) 2+ (normal)                   Extremities: without ischemic changes, without Gangrene , without cellulitis; without open wounds;  Musculoskeletal: no muscle wasting or atrophy  Neurologic: A&O X 3;  No focal weakness or paresthesias are detected Psychiatric:  The pt has Normal affect.   Non-Invasive Vascular Imaging:   Summary:  Patent left brachio cephalic AVF.  Branches noted as above.  No stenosis or thrombus seen.  Velocities <100 cm/s in the proximal and mid upper arm.     ASSESSMENT/PLAN:  Benjamin [name] is a 66 y.o. male who presents with fissure malfunction after recent filtration episode.  Fistula duplex ultrasound reviewed demonstrating widely patent fistula with acceptable flows, size, depth proximally.  More distally, there are branches appreciated and slower flows.  I had a long conversation with Benjamin [name] regarding the above.  He would  be best served with fistulogram to define  flow through the fistula, and likely subsequent branch ligation in an effort to improve flows throughout the fistula.  After discussing the risk and benefits of left arm fistulogram with possible intervention, Benjamin [name] elected to proceed.  We will pursue this on Wednesday as a first case prior to dialysis.  [name], MD [facility] [phone]

## 2022-04-02 DIAGNOSIS — N2581 Secondary hyperparathyroidism of renal origin: Secondary | ICD-10-CM | POA: Diagnosis not present

## 2022-04-02 DIAGNOSIS — Z992 Dependence on renal dialysis: Secondary | ICD-10-CM | POA: Diagnosis not present

## 2022-04-02 DIAGNOSIS — N186 End stage renal disease: Secondary | ICD-10-CM | POA: Diagnosis not present

## 2022-04-05 DIAGNOSIS — I129 Hypertensive chronic kidney disease with stage 1 through stage 4 chronic kidney disease, or unspecified chronic kidney disease: Secondary | ICD-10-CM | POA: Diagnosis not present

## 2022-04-05 DIAGNOSIS — N186 End stage renal disease: Secondary | ICD-10-CM | POA: Diagnosis not present

## 2022-04-05 DIAGNOSIS — Z992 Dependence on renal dialysis: Secondary | ICD-10-CM | POA: Diagnosis not present

## 2022-04-06 DIAGNOSIS — R531 Weakness: Secondary | ICD-10-CM | POA: Diagnosis not present

## 2022-04-06 DIAGNOSIS — N186 End stage renal disease: Secondary | ICD-10-CM | POA: Diagnosis not present

## 2022-04-06 DIAGNOSIS — M25551 Pain in right hip: Secondary | ICD-10-CM | POA: Diagnosis not present

## 2022-04-06 DIAGNOSIS — I5023 Acute on chronic systolic (congestive) heart failure: Secondary | ICD-10-CM | POA: Diagnosis not present

## 2022-04-07 DIAGNOSIS — N2581 Secondary hyperparathyroidism of renal origin: Secondary | ICD-10-CM | POA: Diagnosis not present

## 2022-04-07 DIAGNOSIS — N186 End stage renal disease: Secondary | ICD-10-CM | POA: Diagnosis not present

## 2022-04-07 DIAGNOSIS — Z992 Dependence on renal dialysis: Secondary | ICD-10-CM | POA: Diagnosis not present

## 2022-04-08 ENCOUNTER — Other Ambulatory Visit (HOSPITAL_COMMUNITY): Payer: Self-pay | Admitting: Internal Medicine

## 2022-04-08 DIAGNOSIS — N186 End stage renal disease: Secondary | ICD-10-CM

## 2022-04-08 DIAGNOSIS — Z992 Dependence on renal dialysis: Secondary | ICD-10-CM | POA: Diagnosis not present

## 2022-04-08 DIAGNOSIS — N2581 Secondary hyperparathyroidism of renal origin: Secondary | ICD-10-CM | POA: Diagnosis not present

## 2022-04-08 DIAGNOSIS — R188 Other ascites: Secondary | ICD-10-CM

## 2022-04-09 DIAGNOSIS — N2581 Secondary hyperparathyroidism of renal origin: Secondary | ICD-10-CM | POA: Diagnosis not present

## 2022-04-09 DIAGNOSIS — Z992 Dependence on renal dialysis: Secondary | ICD-10-CM | POA: Diagnosis not present

## 2022-04-09 DIAGNOSIS — N186 End stage renal disease: Secondary | ICD-10-CM | POA: Diagnosis not present

## 2022-04-12 DIAGNOSIS — N2581 Secondary hyperparathyroidism of renal origin: Secondary | ICD-10-CM | POA: Diagnosis not present

## 2022-04-12 DIAGNOSIS — Z992 Dependence on renal dialysis: Secondary | ICD-10-CM | POA: Diagnosis not present

## 2022-04-12 DIAGNOSIS — N186 End stage renal disease: Secondary | ICD-10-CM | POA: Diagnosis not present

## 2022-04-13 ENCOUNTER — Ambulatory Visit (HOSPITAL_COMMUNITY)
Admission: RE | Admit: 2022-04-13 | Discharge: 2022-04-13 | Disposition: A | Discharge status: Home / Self Care | Payer: BC Managed Care – PPO | Source: Ambulatory Visit | Attending: Internal Medicine | Admitting: Internal Medicine

## 2022-04-13 DIAGNOSIS — N189 Chronic kidney disease, unspecified: Secondary | ICD-10-CM | POA: Diagnosis not present

## 2022-04-13 DIAGNOSIS — R188 Other ascites: Secondary | ICD-10-CM | POA: Diagnosis not present

## 2022-04-13 DIAGNOSIS — N186 End stage renal disease: Secondary | ICD-10-CM | POA: Insufficient documentation

## 2022-04-13 HISTORY — PX: IR PARACENTESIS: IMG2679

## 2022-04-13 MED ORDER — LIDOCAINE HCL 1 % IJ SOLN
INTRAMUSCULAR | Status: AC
  Filled 2022-04-13: qty 20

## 2022-04-13 MED ORDER — LIDOCAINE HCL 1 % IJ SOLN
INTRAMUSCULAR | Status: DC | PRN
  Administered 2022-04-13: 15 mL via INTRADERMAL

## 2022-04-13 NOTE — Procedures (Signed)
Ultrasound-guided diagnostic paracentesis performed yielding 6.85 liters of straw colored fluid. No immediate complications. EBL is none.

## 2022-04-15 ENCOUNTER — Other Ambulatory Visit: Payer: Self-pay | Admitting: *Deleted

## 2022-04-15 DIAGNOSIS — N186 End stage renal disease: Secondary | ICD-10-CM

## 2022-04-16 DIAGNOSIS — N186 End stage renal disease: Secondary | ICD-10-CM | POA: Diagnosis not present

## 2022-04-16 DIAGNOSIS — N2581 Secondary hyperparathyroidism of renal origin: Secondary | ICD-10-CM | POA: Diagnosis not present

## 2022-04-16 DIAGNOSIS — Z992 Dependence on renal dialysis: Secondary | ICD-10-CM | POA: Diagnosis not present

## 2022-04-19 ENCOUNTER — Telehealth: Payer: Self-pay

## 2022-04-19 DIAGNOSIS — Z992 Dependence on renal dialysis: Secondary | ICD-10-CM | POA: Diagnosis not present

## 2022-04-19 DIAGNOSIS — N2581 Secondary hyperparathyroidism of renal origin: Secondary | ICD-10-CM | POA: Diagnosis not present

## 2022-04-19 DIAGNOSIS — N186 End stage renal disease: Secondary | ICD-10-CM | POA: Diagnosis not present

## 2022-04-19 NOTE — Telephone Encounter (Signed)
Patient called requesting to cancel his AVF ligation for tomorrow. States the dialysis center wants him to come in tomorrow to get more fluid off and he will callback to reschedule. Provider made aware.

## 2022-04-19 NOTE — Progress Notes (Signed)
I spoke to Mr. Benjamin [name], he was coughing and short of breath. Patient said that he has to go back to dialysis tomorrow at 1000, to get more fluid off.  I called Dr. Unk Lightning office, I left a voice message on the nurses voice mail with the above information.

## 2022-04-20 ENCOUNTER — Ambulatory Visit (HOSPITAL_COMMUNITY)
Admission: RE | Admit: 2022-04-20 | Payer: BC Managed Care – PPO | Source: Home / Self Care | Admitting: Vascular Surgery

## 2022-04-20 DIAGNOSIS — K7031 Alcoholic cirrhosis of liver with ascites: Secondary | ICD-10-CM | POA: Diagnosis not present

## 2022-04-20 DIAGNOSIS — R609 Edema, unspecified: Secondary | ICD-10-CM | POA: Diagnosis not present

## 2022-04-20 SURGERY — LIGATION OF COMPETING BRANCHES OF ARTERIOVENOUS FISTULA
Anesthesia: Monitor Anesthesia Care | Laterality: Left

## 2022-04-21 DIAGNOSIS — R079 Chest pain, unspecified: Secondary | ICD-10-CM | POA: Diagnosis not present

## 2022-04-21 DIAGNOSIS — N186 End stage renal disease: Secondary | ICD-10-CM | POA: Diagnosis not present

## 2022-04-21 DIAGNOSIS — Z992 Dependence on renal dialysis: Secondary | ICD-10-CM | POA: Diagnosis not present

## 2022-04-21 DIAGNOSIS — N2581 Secondary hyperparathyroidism of renal origin: Secondary | ICD-10-CM | POA: Diagnosis not present

## 2022-04-26 DIAGNOSIS — Z992 Dependence on renal dialysis: Secondary | ICD-10-CM | POA: Diagnosis not present

## 2022-04-26 DIAGNOSIS — N186 End stage renal disease: Secondary | ICD-10-CM | POA: Diagnosis not present

## 2022-04-26 DIAGNOSIS — N2581 Secondary hyperparathyroidism of renal origin: Secondary | ICD-10-CM | POA: Diagnosis not present

## 2022-04-28 DIAGNOSIS — N186 End stage renal disease: Secondary | ICD-10-CM | POA: Diagnosis not present

## 2022-04-28 DIAGNOSIS — Z992 Dependence on renal dialysis: Secondary | ICD-10-CM | POA: Diagnosis not present

## 2022-04-28 DIAGNOSIS — N2581 Secondary hyperparathyroidism of renal origin: Secondary | ICD-10-CM | POA: Diagnosis not present

## 2022-04-30 DIAGNOSIS — Z992 Dependence on renal dialysis: Secondary | ICD-10-CM | POA: Diagnosis not present

## 2022-04-30 DIAGNOSIS — N2581 Secondary hyperparathyroidism of renal origin: Secondary | ICD-10-CM | POA: Diagnosis not present

## 2022-04-30 DIAGNOSIS — N186 End stage renal disease: Secondary | ICD-10-CM | POA: Diagnosis not present

## 2022-05-03 DIAGNOSIS — N2581 Secondary hyperparathyroidism of renal origin: Secondary | ICD-10-CM | POA: Diagnosis not present

## 2022-05-03 DIAGNOSIS — Z992 Dependence on renal dialysis: Secondary | ICD-10-CM | POA: Diagnosis not present

## 2022-05-03 DIAGNOSIS — N186 End stage renal disease: Secondary | ICD-10-CM | POA: Diagnosis not present

## 2022-05-05 DIAGNOSIS — E1122 Type 2 diabetes mellitus with diabetic chronic kidney disease: Secondary | ICD-10-CM | POA: Diagnosis not present

## 2022-05-05 DIAGNOSIS — R0602 Shortness of breath: Secondary | ICD-10-CM | POA: Diagnosis not present

## 2022-05-05 DIAGNOSIS — R609 Edema, unspecified: Secondary | ICD-10-CM | POA: Diagnosis not present

## 2022-05-05 DIAGNOSIS — N186 End stage renal disease: Secondary | ICD-10-CM | POA: Diagnosis not present

## 2022-05-05 DIAGNOSIS — K7031 Alcoholic cirrhosis of liver with ascites: Secondary | ICD-10-CM | POA: Diagnosis not present

## 2022-05-05 DIAGNOSIS — E8779 Other fluid overload: Secondary | ICD-10-CM | POA: Diagnosis not present

## 2022-05-05 DIAGNOSIS — R1900 Intra-abdominal and pelvic swelling, mass and lump, unspecified site: Secondary | ICD-10-CM | POA: Diagnosis not present

## 2022-05-05 DIAGNOSIS — I5023 Acute on chronic systolic (congestive) heart failure: Secondary | ICD-10-CM | POA: Diagnosis not present

## 2022-05-05 DIAGNOSIS — Z992 Dependence on renal dialysis: Secondary | ICD-10-CM | POA: Diagnosis not present

## 2022-05-05 DIAGNOSIS — Z79899 Other long term (current) drug therapy: Secondary | ICD-10-CM | POA: Diagnosis not present

## 2022-05-05 DIAGNOSIS — I4891 Unspecified atrial fibrillation: Secondary | ICD-10-CM | POA: Diagnosis not present

## 2022-05-05 DIAGNOSIS — I428 Other cardiomyopathies: Secondary | ICD-10-CM | POA: Diagnosis not present

## 2022-05-05 DIAGNOSIS — I132 Hypertensive heart and chronic kidney disease with heart failure and with stage 5 chronic kidney disease, or end stage renal disease: Secondary | ICD-10-CM | POA: Diagnosis not present

## 2022-05-06 DIAGNOSIS — I129 Hypertensive chronic kidney disease with stage 1 through stage 4 chronic kidney disease, or unspecified chronic kidney disease: Secondary | ICD-10-CM | POA: Diagnosis not present

## 2022-05-06 DIAGNOSIS — Z992 Dependence on renal dialysis: Secondary | ICD-10-CM | POA: Diagnosis not present

## 2022-05-06 DIAGNOSIS — D649 Anemia, unspecified: Secondary | ICD-10-CM | POA: Diagnosis not present

## 2022-05-06 DIAGNOSIS — R0602 Shortness of breath: Secondary | ICD-10-CM | POA: Diagnosis not present

## 2022-05-06 DIAGNOSIS — I10 Essential (primary) hypertension: Secondary | ICD-10-CM | POA: Diagnosis not present

## 2022-05-06 DIAGNOSIS — N186 End stage renal disease: Secondary | ICD-10-CM | POA: Diagnosis not present

## 2022-05-06 DIAGNOSIS — I5023 Acute on chronic systolic (congestive) heart failure: Secondary | ICD-10-CM | POA: Diagnosis not present

## 2022-05-07 DIAGNOSIS — I5023 Acute on chronic systolic (congestive) heart failure: Secondary | ICD-10-CM | POA: Diagnosis not present

## 2022-05-07 DIAGNOSIS — N186 End stage renal disease: Secondary | ICD-10-CM | POA: Diagnosis not present

## 2022-05-07 DIAGNOSIS — Z992 Dependence on renal dialysis: Secondary | ICD-10-CM | POA: Diagnosis not present

## 2022-05-07 DIAGNOSIS — N2581 Secondary hyperparathyroidism of renal origin: Secondary | ICD-10-CM | POA: Diagnosis not present

## 2022-05-07 DIAGNOSIS — M25551 Pain in right hip: Secondary | ICD-10-CM | POA: Diagnosis not present

## 2022-05-07 DIAGNOSIS — R531 Weakness: Secondary | ICD-10-CM | POA: Diagnosis not present

## 2022-05-10 DIAGNOSIS — N186 End stage renal disease: Secondary | ICD-10-CM | POA: Diagnosis not present

## 2022-05-10 DIAGNOSIS — N2581 Secondary hyperparathyroidism of renal origin: Secondary | ICD-10-CM | POA: Diagnosis not present

## 2022-05-10 DIAGNOSIS — Z992 Dependence on renal dialysis: Secondary | ICD-10-CM | POA: Diagnosis not present

## 2022-05-11 ENCOUNTER — Other Ambulatory Visit (HOSPITAL_COMMUNITY): Payer: Self-pay | Admitting: Internal Medicine

## 2022-05-11 ENCOUNTER — Other Ambulatory Visit: Payer: Self-pay | Admitting: Internal Medicine

## 2022-05-11 DIAGNOSIS — R188 Other ascites: Secondary | ICD-10-CM

## 2022-05-12 DIAGNOSIS — N186 End stage renal disease: Secondary | ICD-10-CM | POA: Diagnosis not present

## 2022-05-12 DIAGNOSIS — Z992 Dependence on renal dialysis: Secondary | ICD-10-CM | POA: Diagnosis not present

## 2022-05-12 DIAGNOSIS — N2581 Secondary hyperparathyroidism of renal origin: Secondary | ICD-10-CM | POA: Diagnosis not present

## 2022-05-13 ENCOUNTER — Ambulatory Visit (HOSPITAL_COMMUNITY)
Admission: RE | Admit: 2022-05-13 | Discharge: 2022-05-13 | Disposition: A | Discharge status: Home / Self Care | Payer: BC Managed Care – PPO | Source: Ambulatory Visit | Attending: Internal Medicine | Admitting: Internal Medicine

## 2022-05-13 DIAGNOSIS — R188 Other ascites: Secondary | ICD-10-CM

## 2022-05-13 DIAGNOSIS — N186 End stage renal disease: Secondary | ICD-10-CM | POA: Diagnosis not present

## 2022-05-13 HISTORY — PX: IR PARACENTESIS: IMG2679

## 2022-05-13 MED ORDER — LIDOCAINE HCL 1 % IJ SOLN
INTRAMUSCULAR | Status: AC
  Filled 2022-05-13: qty 20

## 2022-05-13 MED ORDER — LIDOCAINE HCL (PF) 1 % IJ SOLN
INTRAMUSCULAR | Status: DC | PRN
  Administered 2022-05-13: 10 mL

## 2022-05-13 NOTE — Procedures (Signed)
PROCEDURE SUMMARY:  Successful US guided therapeutic paracentesis from LLQ.  Yielded 7.6 L of clear, amber fluid.  No immediate complications.  Pt tolerated well.   Specimen not sent for labs.  EBL < 1 mL  Tyson Alias, AGNP 05/13/2022 2:24 PM

## 2022-05-14 DIAGNOSIS — N186 End stage renal disease: Secondary | ICD-10-CM | POA: Diagnosis not present

## 2022-05-14 DIAGNOSIS — N2581 Secondary hyperparathyroidism of renal origin: Secondary | ICD-10-CM | POA: Diagnosis not present

## 2022-05-14 DIAGNOSIS — Z992 Dependence on renal dialysis: Secondary | ICD-10-CM | POA: Diagnosis not present

## 2022-05-17 DIAGNOSIS — Z992 Dependence on renal dialysis: Secondary | ICD-10-CM | POA: Diagnosis not present

## 2022-05-17 DIAGNOSIS — N186 End stage renal disease: Secondary | ICD-10-CM | POA: Diagnosis not present

## 2022-05-17 DIAGNOSIS — N2581 Secondary hyperparathyroidism of renal origin: Secondary | ICD-10-CM | POA: Diagnosis not present

## 2022-05-19 DIAGNOSIS — N2581 Secondary hyperparathyroidism of renal origin: Secondary | ICD-10-CM | POA: Diagnosis not present

## 2022-05-19 DIAGNOSIS — Z992 Dependence on renal dialysis: Secondary | ICD-10-CM | POA: Diagnosis not present

## 2022-05-19 DIAGNOSIS — N186 End stage renal disease: Secondary | ICD-10-CM | POA: Diagnosis not present

## 2022-05-21 DIAGNOSIS — N2581 Secondary hyperparathyroidism of renal origin: Secondary | ICD-10-CM | POA: Diagnosis not present

## 2022-05-21 DIAGNOSIS — N186 End stage renal disease: Secondary | ICD-10-CM | POA: Diagnosis not present

## 2022-05-21 DIAGNOSIS — Z992 Dependence on renal dialysis: Secondary | ICD-10-CM | POA: Diagnosis not present

## 2022-05-24 DIAGNOSIS — N186 End stage renal disease: Secondary | ICD-10-CM | POA: Diagnosis not present

## 2022-05-24 DIAGNOSIS — Z992 Dependence on renal dialysis: Secondary | ICD-10-CM | POA: Diagnosis not present

## 2022-05-24 DIAGNOSIS — N2581 Secondary hyperparathyroidism of renal origin: Secondary | ICD-10-CM | POA: Diagnosis not present

## 2022-05-26 ENCOUNTER — Other Ambulatory Visit: Payer: Self-pay

## 2022-05-26 DIAGNOSIS — Z992 Dependence on renal dialysis: Secondary | ICD-10-CM | POA: Diagnosis not present

## 2022-05-26 DIAGNOSIS — N2581 Secondary hyperparathyroidism of renal origin: Secondary | ICD-10-CM | POA: Diagnosis not present

## 2022-05-26 DIAGNOSIS — N186 End stage renal disease: Secondary | ICD-10-CM | POA: Diagnosis not present

## 2022-05-26 NOTE — Telephone Encounter (Signed)
Received a call from [place] at [facility] requesting to reschedule patient for AVF ligation. Spoke with patient who agreed to schedule for 06/01/22. Instructions reviewed- verbalized understanding. Faxed instructions letter to facility.

## 2022-05-28 DIAGNOSIS — N2581 Secondary hyperparathyroidism of renal origin: Secondary | ICD-10-CM | POA: Diagnosis not present

## 2022-05-28 DIAGNOSIS — N186 End stage renal disease: Secondary | ICD-10-CM | POA: Diagnosis not present

## 2022-05-28 DIAGNOSIS — Z992 Dependence on renal dialysis: Secondary | ICD-10-CM | POA: Diagnosis not present

## 2022-05-31 DIAGNOSIS — E8779 Other fluid overload: Secondary | ICD-10-CM | POA: Diagnosis not present

## 2022-05-31 DIAGNOSIS — Z992 Dependence on renal dialysis: Secondary | ICD-10-CM | POA: Diagnosis not present

## 2022-05-31 DIAGNOSIS — N2581 Secondary hyperparathyroidism of renal origin: Secondary | ICD-10-CM | POA: Diagnosis not present

## 2022-05-31 DIAGNOSIS — N186 End stage renal disease: Secondary | ICD-10-CM | POA: Diagnosis not present

## 2022-05-31 NOTE — Telephone Encounter (Signed)
Received a voice message from Jan G., RN at [facility]- preadmission, advising that she spoke with patient who insisted that he is not coming for surgery on tomorrow and doesn't plan to schedule until he get straight with dialysis. Dr. Virl Cagey made aware.

## 2022-05-31 NOTE — Progress Notes (Signed)
Benjamin [name] called,  he told me he is not having surgery tomorrow, he said, "I want you to do what I ask, "I want to get straight with dialysis before I have another surgery."  I explained that going forward with the surgery, is a step to getting catheter removed, Benjamin [name] said that  he does not care. I called Dr. Virl Cagey office and left a message on the nurse line.

## 2022-06-01 ENCOUNTER — Ambulatory Visit (HOSPITAL_COMMUNITY)
Admission: RE | Admit: 2022-06-01 | Payer: BC Managed Care – PPO | Source: Home / Self Care | Admitting: Vascular Surgery

## 2022-06-01 DIAGNOSIS — N186 End stage renal disease: Secondary | ICD-10-CM | POA: Diagnosis not present

## 2022-06-01 DIAGNOSIS — E8779 Other fluid overload: Secondary | ICD-10-CM | POA: Diagnosis not present

## 2022-06-01 DIAGNOSIS — N2581 Secondary hyperparathyroidism of renal origin: Secondary | ICD-10-CM | POA: Diagnosis not present

## 2022-06-01 DIAGNOSIS — Z992 Dependence on renal dialysis: Secondary | ICD-10-CM | POA: Diagnosis not present

## 2022-06-01 SURGERY — LIGATION OF COMPETING BRANCHES OF ARTERIOVENOUS FISTULA
Anesthesia: Monitor Anesthesia Care | Laterality: Left

## 2022-06-02 DIAGNOSIS — N186 End stage renal disease: Secondary | ICD-10-CM | POA: Diagnosis not present

## 2022-06-02 DIAGNOSIS — Z992 Dependence on renal dialysis: Secondary | ICD-10-CM | POA: Diagnosis not present

## 2022-06-02 DIAGNOSIS — N2581 Secondary hyperparathyroidism of renal origin: Secondary | ICD-10-CM | POA: Diagnosis not present

## 2022-06-02 DIAGNOSIS — E8779 Other fluid overload: Secondary | ICD-10-CM | POA: Diagnosis not present

## 2022-06-04 DIAGNOSIS — Z992 Dependence on renal dialysis: Secondary | ICD-10-CM | POA: Diagnosis not present

## 2022-06-04 DIAGNOSIS — N186 End stage renal disease: Secondary | ICD-10-CM | POA: Diagnosis not present

## 2022-06-04 DIAGNOSIS — E8779 Other fluid overload: Secondary | ICD-10-CM | POA: Diagnosis not present

## 2022-06-04 DIAGNOSIS — N2581 Secondary hyperparathyroidism of renal origin: Secondary | ICD-10-CM | POA: Diagnosis not present

## 2022-06-05 DIAGNOSIS — I129 Hypertensive chronic kidney disease with stage 1 through stage 4 chronic kidney disease, or unspecified chronic kidney disease: Secondary | ICD-10-CM | POA: Diagnosis not present

## 2022-06-05 DIAGNOSIS — N186 End stage renal disease: Secondary | ICD-10-CM | POA: Diagnosis not present

## 2022-06-05 DIAGNOSIS — Z992 Dependence on renal dialysis: Secondary | ICD-10-CM | POA: Diagnosis not present

## 2022-06-06 DIAGNOSIS — N186 End stage renal disease: Secondary | ICD-10-CM | POA: Diagnosis not present

## 2022-06-06 DIAGNOSIS — R531 Weakness: Secondary | ICD-10-CM | POA: Diagnosis not present

## 2022-06-06 DIAGNOSIS — M25551 Pain in right hip: Secondary | ICD-10-CM | POA: Diagnosis not present

## 2022-06-06 DIAGNOSIS — I5023 Acute on chronic systolic (congestive) heart failure: Secondary | ICD-10-CM | POA: Diagnosis not present

## 2022-06-07 DIAGNOSIS — N2581 Secondary hyperparathyroidism of renal origin: Secondary | ICD-10-CM | POA: Diagnosis not present

## 2022-06-07 DIAGNOSIS — Z992 Dependence on renal dialysis: Secondary | ICD-10-CM | POA: Diagnosis not present

## 2022-06-07 DIAGNOSIS — N186 End stage renal disease: Secondary | ICD-10-CM | POA: Diagnosis not present

## 2022-06-09 DIAGNOSIS — Z992 Dependence on renal dialysis: Secondary | ICD-10-CM | POA: Diagnosis not present

## 2022-06-09 DIAGNOSIS — N2581 Secondary hyperparathyroidism of renal origin: Secondary | ICD-10-CM | POA: Diagnosis not present

## 2022-06-09 DIAGNOSIS — N186 End stage renal disease: Secondary | ICD-10-CM | POA: Diagnosis not present

## 2022-06-11 DIAGNOSIS — N2581 Secondary hyperparathyroidism of renal origin: Secondary | ICD-10-CM | POA: Diagnosis not present

## 2022-06-11 DIAGNOSIS — N186 End stage renal disease: Secondary | ICD-10-CM | POA: Diagnosis not present

## 2022-06-11 DIAGNOSIS — Z992 Dependence on renal dialysis: Secondary | ICD-10-CM | POA: Diagnosis not present

## 2022-06-11 IMAGING — US US PARACENTESIS
1 series · 6 of 6 positions shown · non-contrast
Comparison: none

INDICATION: Patient with medical history significant for DM type 2, HTN, CHF and
stage 5 CKD. Patient is not yet on HD and has developed large volume
ascites that has not been relieved with high dose diuretics. Request
for IR to perform therapeutic paracentesis.

[Series 1: us paracentesis mc & wl · 6 of 6 slices shown]
[im 1/6]
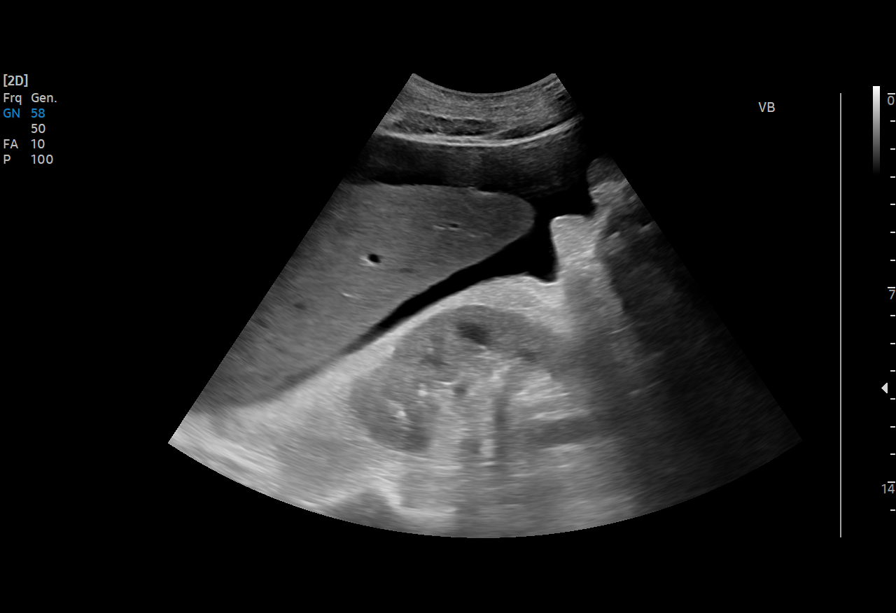
[im 2/6]
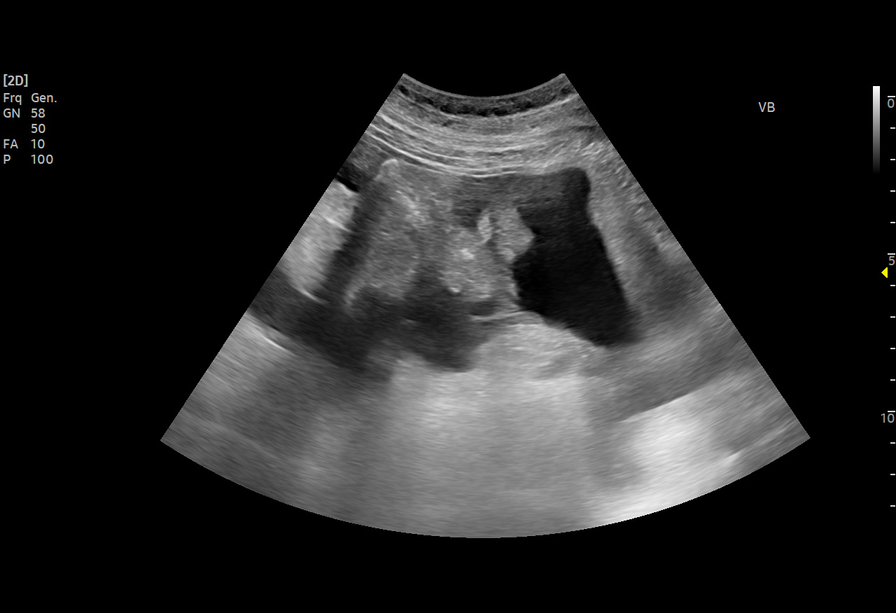
[im 3/6]
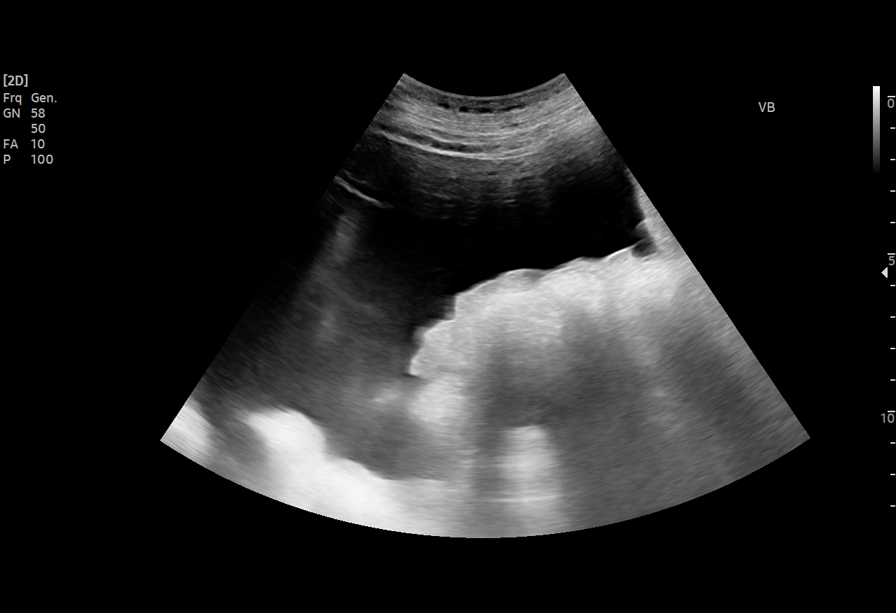
[im 4/6]
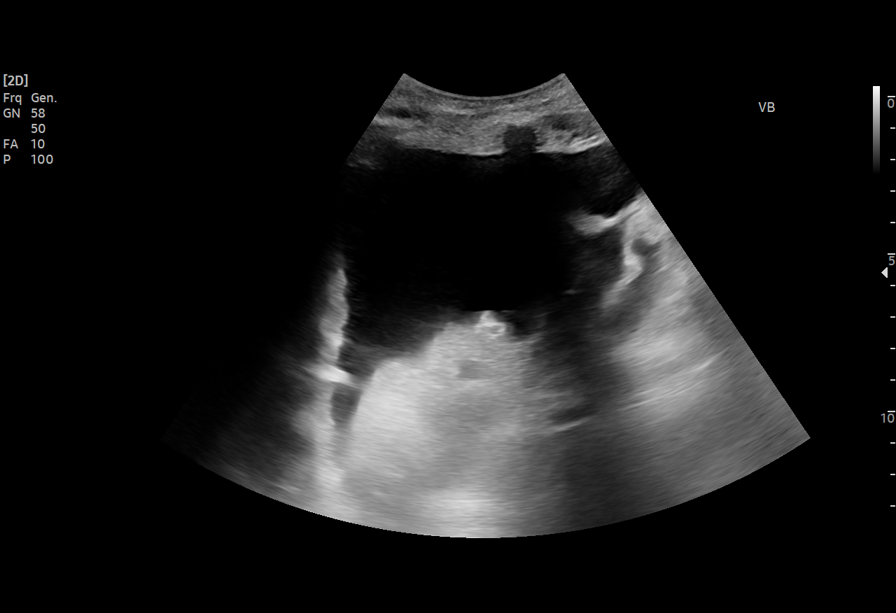
[im 5/6]
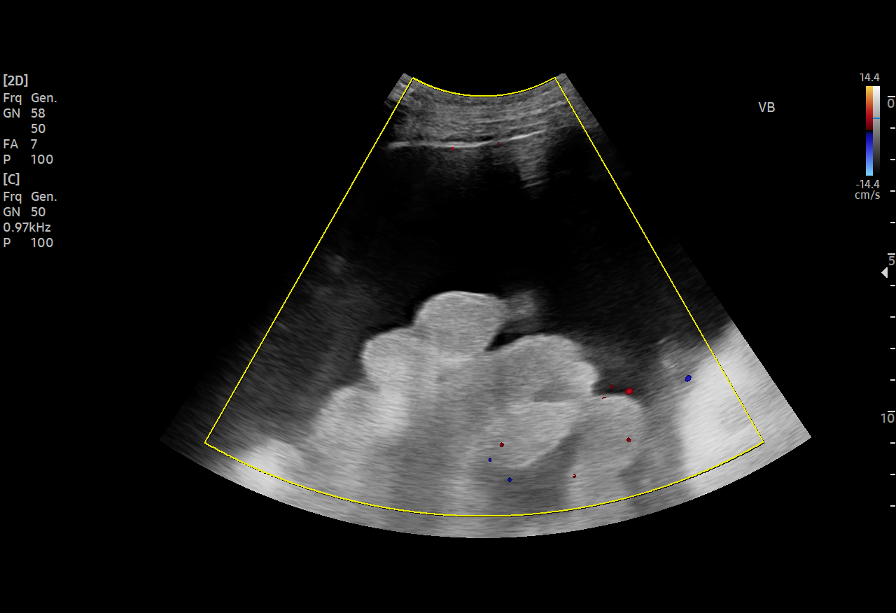
[im 6/6]
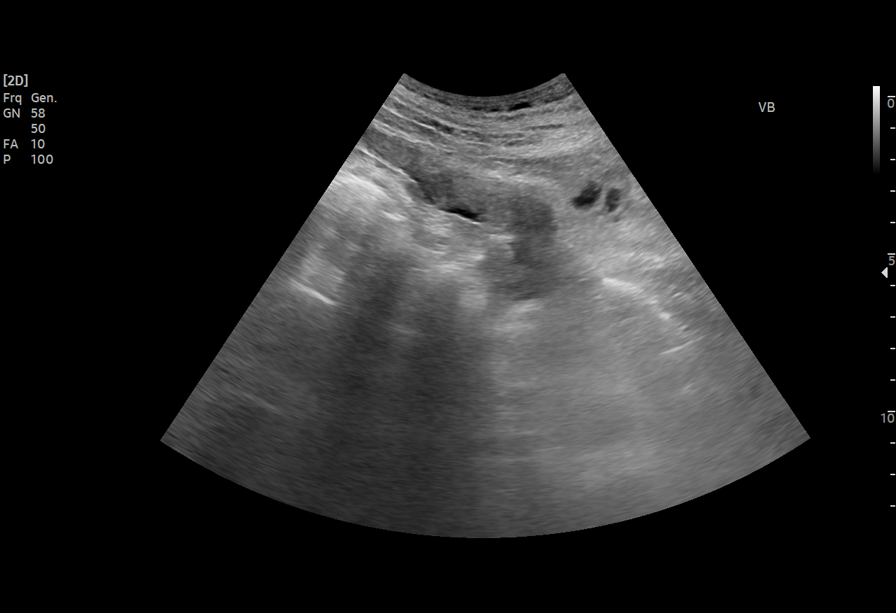

[6 of 6 positions shown; findings below may reference images not displayed]

EXAM:
ULTRASOUND GUIDED THERAPEUTIC PARACENTESIS

MEDICATIONS:
10ml 1% lidocaine

COMPLICATIONS:
None immediate.

PROCEDURE:
Informed written consent was obtained from the patient after a
discussion of the risks, benefits and alternatives to treatment. A
timeout was performed prior to the initiation of the procedure.

Initial ultrasound scanning demonstrates a large amount of ascites
within the left lower abdominal quadrant. The left lower abdomen was
prepped and draped in the usual sterile fashion. 1% lidocaine was
used for local anesthesia.

Following this, a 19 gauge, 7-cm, Yueh catheter was introduced. An
ultrasound image was saved for documentation purposes. The
paracentesis was performed. The catheter was removed and a dressing
was applied. The patient tolerated the procedure well without
immediate post procedural complication.
FINDINGS: A total of approximately 4.7 L of clear, amber fluid was removed.
IMPRESSION: Successful ultrasound-guided paracentesis yielding 4.7 liters of
peritoneal fluid.

## 2022-06-14 DIAGNOSIS — N2581 Secondary hyperparathyroidism of renal origin: Secondary | ICD-10-CM | POA: Diagnosis not present

## 2022-06-14 DIAGNOSIS — Z992 Dependence on renal dialysis: Secondary | ICD-10-CM | POA: Diagnosis not present

## 2022-06-14 DIAGNOSIS — Z23 Encounter for immunization: Secondary | ICD-10-CM | POA: Diagnosis not present

## 2022-06-14 DIAGNOSIS — N186 End stage renal disease: Secondary | ICD-10-CM | POA: Diagnosis not present

## 2022-06-15 ENCOUNTER — Ambulatory Visit (HOSPITAL_BASED_OUTPATIENT_CLINIC_OR_DEPARTMENT_OTHER): Payer: BC Managed Care – PPO | Admitting: General Surgery

## 2022-06-16 DIAGNOSIS — N186 End stage renal disease: Secondary | ICD-10-CM | POA: Diagnosis not present

## 2022-06-16 DIAGNOSIS — Z992 Dependence on renal dialysis: Secondary | ICD-10-CM | POA: Diagnosis not present

## 2022-06-16 DIAGNOSIS — Z23 Encounter for immunization: Secondary | ICD-10-CM | POA: Diagnosis not present

## 2022-06-16 DIAGNOSIS — N2581 Secondary hyperparathyroidism of renal origin: Secondary | ICD-10-CM | POA: Diagnosis not present

## 2022-06-18 DIAGNOSIS — N186 End stage renal disease: Secondary | ICD-10-CM | POA: Diagnosis not present

## 2022-06-18 DIAGNOSIS — N2581 Secondary hyperparathyroidism of renal origin: Secondary | ICD-10-CM | POA: Diagnosis not present

## 2022-06-18 DIAGNOSIS — Z23 Encounter for immunization: Secondary | ICD-10-CM | POA: Diagnosis not present

## 2022-06-18 DIAGNOSIS — Z992 Dependence on renal dialysis: Secondary | ICD-10-CM | POA: Diagnosis not present

## 2022-06-21 DIAGNOSIS — Z992 Dependence on renal dialysis: Secondary | ICD-10-CM | POA: Diagnosis not present

## 2022-06-21 DIAGNOSIS — N186 End stage renal disease: Secondary | ICD-10-CM | POA: Diagnosis not present

## 2022-06-21 DIAGNOSIS — N2581 Secondary hyperparathyroidism of renal origin: Secondary | ICD-10-CM | POA: Diagnosis not present

## 2022-06-23 DIAGNOSIS — N186 End stage renal disease: Secondary | ICD-10-CM | POA: Diagnosis not present

## 2022-06-23 DIAGNOSIS — Z992 Dependence on renal dialysis: Secondary | ICD-10-CM | POA: Diagnosis not present

## 2022-06-23 DIAGNOSIS — N2581 Secondary hyperparathyroidism of renal origin: Secondary | ICD-10-CM | POA: Diagnosis not present

## 2022-06-25 DIAGNOSIS — N2581 Secondary hyperparathyroidism of renal origin: Secondary | ICD-10-CM | POA: Diagnosis not present

## 2022-06-25 DIAGNOSIS — N186 End stage renal disease: Secondary | ICD-10-CM | POA: Diagnosis not present

## 2022-06-25 DIAGNOSIS — Z992 Dependence on renal dialysis: Secondary | ICD-10-CM | POA: Diagnosis not present

## 2022-06-28 DIAGNOSIS — N2581 Secondary hyperparathyroidism of renal origin: Secondary | ICD-10-CM | POA: Diagnosis not present

## 2022-06-28 DIAGNOSIS — N186 End stage renal disease: Secondary | ICD-10-CM | POA: Diagnosis not present

## 2022-06-28 DIAGNOSIS — Z992 Dependence on renal dialysis: Secondary | ICD-10-CM | POA: Diagnosis not present

## 2022-06-30 DIAGNOSIS — N186 End stage renal disease: Secondary | ICD-10-CM | POA: Diagnosis not present

## 2022-06-30 DIAGNOSIS — Z992 Dependence on renal dialysis: Secondary | ICD-10-CM | POA: Diagnosis not present

## 2022-06-30 DIAGNOSIS — N2581 Secondary hyperparathyroidism of renal origin: Secondary | ICD-10-CM | POA: Diagnosis not present

## 2022-07-02 DIAGNOSIS — Z992 Dependence on renal dialysis: Secondary | ICD-10-CM | POA: Diagnosis not present

## 2022-07-02 DIAGNOSIS — N2581 Secondary hyperparathyroidism of renal origin: Secondary | ICD-10-CM | POA: Diagnosis not present

## 2022-07-02 DIAGNOSIS — N186 End stage renal disease: Secondary | ICD-10-CM | POA: Diagnosis not present

## 2022-07-05 DIAGNOSIS — N2581 Secondary hyperparathyroidism of renal origin: Secondary | ICD-10-CM | POA: Diagnosis not present

## 2022-07-05 DIAGNOSIS — N186 End stage renal disease: Secondary | ICD-10-CM | POA: Diagnosis not present

## 2022-07-05 DIAGNOSIS — Z992 Dependence on renal dialysis: Secondary | ICD-10-CM | POA: Diagnosis not present

## 2022-07-06 DIAGNOSIS — I129 Hypertensive chronic kidney disease with stage 1 through stage 4 chronic kidney disease, or unspecified chronic kidney disease: Secondary | ICD-10-CM | POA: Diagnosis not present

## 2022-07-06 DIAGNOSIS — Z992 Dependence on renal dialysis: Secondary | ICD-10-CM | POA: Diagnosis not present

## 2022-07-07 DIAGNOSIS — N2581 Secondary hyperparathyroidism of renal origin: Secondary | ICD-10-CM | POA: Diagnosis not present

## 2022-07-07 DIAGNOSIS — N186 End stage renal disease: Secondary | ICD-10-CM | POA: Diagnosis not present

## 2022-07-07 DIAGNOSIS — Z992 Dependence on renal dialysis: Secondary | ICD-10-CM | POA: Diagnosis not present

## 2022-07-09 DIAGNOSIS — N2581 Secondary hyperparathyroidism of renal origin: Secondary | ICD-10-CM | POA: Diagnosis not present

## 2022-07-09 DIAGNOSIS — N186 End stage renal disease: Secondary | ICD-10-CM | POA: Diagnosis not present

## 2022-07-09 DIAGNOSIS — Z992 Dependence on renal dialysis: Secondary | ICD-10-CM | POA: Diagnosis not present

## 2022-07-12 DIAGNOSIS — Z992 Dependence on renal dialysis: Secondary | ICD-10-CM | POA: Diagnosis not present

## 2022-07-12 DIAGNOSIS — N2581 Secondary hyperparathyroidism of renal origin: Secondary | ICD-10-CM | POA: Diagnosis not present

## 2022-07-12 DIAGNOSIS — N186 End stage renal disease: Secondary | ICD-10-CM | POA: Diagnosis not present

## 2022-07-14 DIAGNOSIS — N2581 Secondary hyperparathyroidism of renal origin: Secondary | ICD-10-CM | POA: Diagnosis not present

## 2022-07-14 DIAGNOSIS — Z992 Dependence on renal dialysis: Secondary | ICD-10-CM | POA: Diagnosis not present

## 2022-07-14 DIAGNOSIS — N186 End stage renal disease: Secondary | ICD-10-CM | POA: Diagnosis not present

## 2022-07-15 ENCOUNTER — Telehealth: Payer: Self-pay

## 2022-07-15 NOTE — Patient Outreach (Signed)
  Care Coordination   07/15/2022 Name: Benjamin [name] MRN: [id number] DOB: [birth year]   Care Coordination Outreach Attempts:  An unsuccessful telephone outreach was attempted today to offer the patient information about available care coordination services as a benefit of their health plan.   Patient not available.  Follow Up Plan:  Additional outreach attempts will be made to offer the patient care coordination information and services.   Encounter Outcome:  No Answer  Care Coordination Interventions Activated:  No   Care Coordination Interventions:  No, not indicated    Tomasa Rand, RN, BSN, CEN [facility] [employer] [phone]

## 2022-07-16 ENCOUNTER — Telehealth: Payer: Self-pay

## 2022-07-16 DIAGNOSIS — Z992 Dependence on renal dialysis: Secondary | ICD-10-CM | POA: Diagnosis not present

## 2022-07-16 DIAGNOSIS — N2581 Secondary hyperparathyroidism of renal origin: Secondary | ICD-10-CM | POA: Diagnosis not present

## 2022-07-16 DIAGNOSIS — N186 End stage renal disease: Secondary | ICD-10-CM | POA: Diagnosis not present

## 2022-07-16 NOTE — Patient Outreach (Signed)
  Care Coordination   Initial Visit Note   07/16/2022 Name: QAADIR KENT MRN: [id number] DOB: [birth year]  Patricia Nettle is a 66 y.o. year old male who sees Mateo Flow, MD for primary care. I spoke with  Donald Siva Vandagriff's wife by phone today.  Noted in EMR ok to speak with wife. What matters to the patients health and wellness today?  Explained to wife [facility] care coordination program.  Wife declines    SDOH assessments and interventions completed:  No     Care Coordination Interventions Activated:  No  Care Coordination Interventions:  No, not indicated   Follow up plan: No further intervention required.   Encounter Outcome:  Pt. Refused   Tomasa Rand, RN, BSN, CEN [facility] [employer] [phone]

## 2022-07-19 DIAGNOSIS — N2581 Secondary hyperparathyroidism of renal origin: Secondary | ICD-10-CM | POA: Diagnosis not present

## 2022-07-19 DIAGNOSIS — N186 End stage renal disease: Secondary | ICD-10-CM | POA: Diagnosis not present

## 2022-07-19 DIAGNOSIS — Z992 Dependence on renal dialysis: Secondary | ICD-10-CM | POA: Diagnosis not present

## 2022-07-21 DIAGNOSIS — N186 End stage renal disease: Secondary | ICD-10-CM | POA: Diagnosis not present

## 2022-07-21 DIAGNOSIS — N2581 Secondary hyperparathyroidism of renal origin: Secondary | ICD-10-CM | POA: Diagnosis not present

## 2022-07-21 DIAGNOSIS — Z992 Dependence on renal dialysis: Secondary | ICD-10-CM | POA: Diagnosis not present

## 2022-07-23 DIAGNOSIS — N2581 Secondary hyperparathyroidism of renal origin: Secondary | ICD-10-CM | POA: Diagnosis not present

## 2022-07-23 DIAGNOSIS — N186 End stage renal disease: Secondary | ICD-10-CM | POA: Diagnosis not present

## 2022-07-23 DIAGNOSIS — Z992 Dependence on renal dialysis: Secondary | ICD-10-CM | POA: Diagnosis not present

## 2022-07-26 DIAGNOSIS — Z992 Dependence on renal dialysis: Secondary | ICD-10-CM | POA: Diagnosis not present

## 2022-07-26 DIAGNOSIS — N2581 Secondary hyperparathyroidism of renal origin: Secondary | ICD-10-CM | POA: Diagnosis not present

## 2022-07-26 DIAGNOSIS — N186 End stage renal disease: Secondary | ICD-10-CM | POA: Diagnosis not present

## 2022-07-28 DIAGNOSIS — N186 End stage renal disease: Secondary | ICD-10-CM | POA: Diagnosis not present

## 2022-07-28 DIAGNOSIS — N2581 Secondary hyperparathyroidism of renal origin: Secondary | ICD-10-CM | POA: Diagnosis not present

## 2022-07-28 DIAGNOSIS — Z992 Dependence on renal dialysis: Secondary | ICD-10-CM | POA: Diagnosis not present

## 2022-07-30 DIAGNOSIS — Z992 Dependence on renal dialysis: Secondary | ICD-10-CM | POA: Diagnosis not present

## 2022-07-30 DIAGNOSIS — N2581 Secondary hyperparathyroidism of renal origin: Secondary | ICD-10-CM | POA: Diagnosis not present

## 2022-07-30 DIAGNOSIS — N186 End stage renal disease: Secondary | ICD-10-CM | POA: Diagnosis not present

## 2022-08-02 DIAGNOSIS — Z992 Dependence on renal dialysis: Secondary | ICD-10-CM | POA: Diagnosis not present

## 2022-08-02 DIAGNOSIS — N186 End stage renal disease: Secondary | ICD-10-CM | POA: Diagnosis not present

## 2022-08-02 DIAGNOSIS — N2581 Secondary hyperparathyroidism of renal origin: Secondary | ICD-10-CM | POA: Diagnosis not present

## 2022-08-04 DIAGNOSIS — N2581 Secondary hyperparathyroidism of renal origin: Secondary | ICD-10-CM | POA: Diagnosis not present

## 2022-08-04 DIAGNOSIS — N186 End stage renal disease: Secondary | ICD-10-CM | POA: Diagnosis not present

## 2022-08-04 DIAGNOSIS — Z992 Dependence on renal dialysis: Secondary | ICD-10-CM | POA: Diagnosis not present

## 2022-08-05 DIAGNOSIS — Z992 Dependence on renal dialysis: Secondary | ICD-10-CM | POA: Diagnosis not present

## 2022-08-05 DIAGNOSIS — N186 End stage renal disease: Secondary | ICD-10-CM | POA: Diagnosis not present

## 2022-08-05 DIAGNOSIS — I129 Hypertensive chronic kidney disease with stage 1 through stage 4 chronic kidney disease, or unspecified chronic kidney disease: Secondary | ICD-10-CM | POA: Diagnosis not present

## 2022-08-06 DIAGNOSIS — Z992 Dependence on renal dialysis: Secondary | ICD-10-CM | POA: Diagnosis not present

## 2022-08-06 DIAGNOSIS — N186 End stage renal disease: Secondary | ICD-10-CM | POA: Diagnosis not present

## 2022-08-06 DIAGNOSIS — N2581 Secondary hyperparathyroidism of renal origin: Secondary | ICD-10-CM | POA: Diagnosis not present

## 2022-10-03 ENCOUNTER — Other Ambulatory Visit: Payer: Self-pay

## 2022-10-03 ENCOUNTER — Emergency Department (HOSPITAL_COMMUNITY): Payer: Medicare Other

## 2022-10-03 ENCOUNTER — Inpatient Hospital Stay (HOSPITAL_COMMUNITY)
Admission: EM | Admit: 2022-10-03 | Discharge: 2022-10-13 | DRG: 871 | Disposition: A | Discharge status: Skilled Nursing Facility | Payer: Medicare Other | Attending: Internal Medicine | Admitting: Internal Medicine

## 2022-10-03 ENCOUNTER — Encounter (HOSPITAL_COMMUNITY): Payer: Self-pay

## 2022-10-03 DIAGNOSIS — I953 Hypotension of hemodialysis: Secondary | ICD-10-CM | POA: Diagnosis not present

## 2022-10-03 DIAGNOSIS — E876 Hypokalemia: Secondary | ICD-10-CM | POA: Diagnosis present

## 2022-10-03 DIAGNOSIS — I2489 Other forms of acute ischemic heart disease: Secondary | ICD-10-CM | POA: Diagnosis present

## 2022-10-03 DIAGNOSIS — Z992 Dependence on renal dialysis: Secondary | ICD-10-CM

## 2022-10-03 DIAGNOSIS — E440 Moderate protein-calorie malnutrition: Secondary | ICD-10-CM | POA: Insufficient documentation

## 2022-10-03 DIAGNOSIS — E1122 Type 2 diabetes mellitus with diabetic chronic kidney disease: Secondary | ICD-10-CM | POA: Diagnosis present

## 2022-10-03 DIAGNOSIS — D631 Anemia in chronic kidney disease: Secondary | ICD-10-CM | POA: Diagnosis present

## 2022-10-03 DIAGNOSIS — E11649 Type 2 diabetes mellitus with hypoglycemia without coma: Secondary | ICD-10-CM | POA: Diagnosis present

## 2022-10-03 DIAGNOSIS — D696 Thrombocytopenia, unspecified: Secondary | ICD-10-CM

## 2022-10-03 DIAGNOSIS — E874 Mixed disorder of acid-base balance: Secondary | ICD-10-CM | POA: Diagnosis present

## 2022-10-03 DIAGNOSIS — A419 Sepsis, unspecified organism: Secondary | ICD-10-CM | POA: Diagnosis present

## 2022-10-03 DIAGNOSIS — J189 Pneumonia, unspecified organism: Secondary | ICD-10-CM | POA: Diagnosis present

## 2022-10-03 DIAGNOSIS — J440 Chronic obstructive pulmonary disease with acute lower respiratory infection: Secondary | ICD-10-CM | POA: Diagnosis present

## 2022-10-03 DIAGNOSIS — K7031 Alcoholic cirrhosis of liver with ascites: Secondary | ICD-10-CM | POA: Diagnosis present

## 2022-10-03 DIAGNOSIS — N186 End stage renal disease: Secondary | ICD-10-CM

## 2022-10-03 DIAGNOSIS — Z1152 Encounter for screening for COVID-19: Secondary | ICD-10-CM | POA: Diagnosis not present

## 2022-10-03 DIAGNOSIS — F1021 Alcohol dependence, in remission: Secondary | ICD-10-CM | POA: Diagnosis present

## 2022-10-03 DIAGNOSIS — E872 Acidosis, unspecified: Secondary | ICD-10-CM | POA: Diagnosis present

## 2022-10-03 DIAGNOSIS — G9341 Metabolic encephalopathy: Secondary | ICD-10-CM | POA: Diagnosis present

## 2022-10-03 DIAGNOSIS — F05 Delirium due to known physiological condition: Secondary | ICD-10-CM | POA: Diagnosis not present

## 2022-10-03 DIAGNOSIS — J9601 Acute respiratory failure with hypoxia: Secondary | ICD-10-CM | POA: Diagnosis present

## 2022-10-03 DIAGNOSIS — R5381 Other malaise: Secondary | ICD-10-CM | POA: Diagnosis present

## 2022-10-03 DIAGNOSIS — R6521 Severe sepsis with septic shock: Secondary | ICD-10-CM | POA: Diagnosis present

## 2022-10-03 DIAGNOSIS — E871 Hypo-osmolality and hyponatremia: Secondary | ICD-10-CM | POA: Diagnosis present

## 2022-10-03 DIAGNOSIS — Z87891 Personal history of nicotine dependence: Secondary | ICD-10-CM

## 2022-10-03 DIAGNOSIS — I132 Hypertensive heart and chronic kidney disease with heart failure and with stage 5 chronic kidney disease, or end stage renal disease: Secondary | ICD-10-CM | POA: Diagnosis present

## 2022-10-03 DIAGNOSIS — R579 Shock, unspecified: Secondary | ICD-10-CM | POA: Diagnosis present

## 2022-10-03 DIAGNOSIS — I5042 Chronic combined systolic (congestive) and diastolic (congestive) heart failure: Secondary | ICD-10-CM | POA: Diagnosis present

## 2022-10-03 DIAGNOSIS — Z823 Family history of stroke: Secondary | ICD-10-CM

## 2022-10-03 LAB — BODY FLUID CELL COUNT WITH DIFFERENTIAL
Eos, Fluid: 0 %
Lymphs, Fluid: 30 %
Monocyte-Macrophage-Serous Fluid: 56 % (ref 50–90)
Neutrophil Count, Fluid: 14 % (ref 0–25)
Total Nucleated Cell Count, Fluid: 166 cu mm (ref 0–1000)

## 2022-10-03 LAB — CBC
HCT: 29.8 % — ABNORMAL LOW (ref 39.0–52.0)
Hemoglobin: 9.5 g/dL — ABNORMAL LOW (ref 13.0–17.0)
MCH: 30.2 pg (ref 26.0–34.0)
MCHC: 31.9 g/dL (ref 30.0–36.0)
MCV: 94.6 fL (ref 80.0–100.0)
Platelets: 64 10*3/uL — ABNORMAL LOW (ref 150–400)
RBC: 3.15 MIL/uL — ABNORMAL LOW (ref 4.22–5.81)
RDW: 17.9 % — ABNORMAL HIGH (ref 11.5–15.5)
WBC: 13.5 10*3/uL — ABNORMAL HIGH (ref 4.0–10.5)
nRBC: 0 % (ref 0.0–0.2)

## 2022-10-03 LAB — LACTIC ACID, PLASMA
Lactic Acid, Venous: 3.4 mmol/L (ref 0.5–1.9)
Lactic Acid, Venous: 3.6 mmol/L (ref 0.5–1.9)
Lactic Acid, Venous: 3.2 mmol/L (ref 0.5–1.9)
Lactic Acid, Venous: 2.5 mmol/L (ref 0.5–1.9)

## 2022-10-03 LAB — CBG MONITORING, ED
Glucose-Capillary: 42 mg/dL — CL (ref 70–99)
Glucose-Capillary: 108 mg/dL — ABNORMAL HIGH (ref 70–99)
Glucose-Capillary: 69 mg/dL — ABNORMAL LOW (ref 70–99)
Glucose-Capillary: 94 mg/dL (ref 70–99)

## 2022-10-03 LAB — CBC WITH DIFFERENTIAL/PLATELET
Abs Immature Granulocytes: 0.09 10*3/uL — ABNORMAL HIGH (ref 0.00–0.07)
Basophils Absolute: 0.1 10*3/uL (ref 0.0–0.1)
Basophils Relative: 1 %
Eosinophils Absolute: 0.2 10*3/uL (ref 0.0–0.5)
Eosinophils Relative: 1 %
HCT: 38.6 % — ABNORMAL LOW (ref 39.0–52.0)
Hemoglobin: 12.1 g/dL — ABNORMAL LOW (ref 13.0–17.0)
Immature Granulocytes: 1 %
Lymphocytes Relative: 4 %
Lymphs Abs: 0.6 10*3/uL — ABNORMAL LOW (ref 0.7–4.0)
MCH: 29.7 pg (ref 26.0–34.0)
MCHC: 31.3 g/dL (ref 30.0–36.0)
MCV: 94.8 fL (ref 80.0–100.0)
Monocytes Absolute: 0.6 10*3/uL (ref 0.1–1.0)
Monocytes Relative: 5 %
Neutro Abs: 12 10*3/uL — ABNORMAL HIGH (ref 1.7–7.7)
Neutrophils Relative %: 88 %
Platelets: 66 10*3/uL — ABNORMAL LOW (ref 150–400)
RBC: 4.07 MIL/uL — ABNORMAL LOW (ref 4.22–5.81)
RDW: 17.8 % — ABNORMAL HIGH (ref 11.5–15.5)
WBC: 13.5 10*3/uL — ABNORMAL HIGH (ref 4.0–10.5)
nRBC: 0 % (ref 0.0–0.2)

## 2022-10-03 LAB — ALBUMIN, PLEURAL OR PERITONEAL FLUID: Albumin, Fluid: <1.5 g/dL

## 2022-10-03 LAB — PROTEIN, PLEURAL OR PERITONEAL FLUID: Total protein, fluid: <3 g/dL

## 2022-10-03 LAB — I-STAT VENOUS BLOOD GAS, ED
Acid-base deficit: 5 mmol/L — ABNORMAL HIGH (ref 0.0–2.0)
Bicarbonate: 23.2 mmol/L (ref 20.0–28.0)
Calcium, Ion: 1.08 mmol/L — ABNORMAL LOW (ref 1.15–1.40)
HCT: 38 % — ABNORMAL LOW (ref 39.0–52.0)
Hemoglobin: 12.9 g/dL — ABNORMAL LOW (ref 13.0–17.0)
O2 Saturation: 65 %
Potassium: 3.7 mmol/L (ref 3.5–5.1)
Sodium: 132 mmol/L — ABNORMAL LOW (ref 135–145)
TCO2: 25 mmol/L (ref 22–32)
pCO2, Ven: 53.2 mmHg (ref 44–60)
pH, Ven: 7.247 — ABNORMAL LOW (ref 7.25–7.43)
pO2, Ven: 40 mmHg (ref 32–45)

## 2022-10-03 LAB — BASIC METABOLIC PANEL
Anion gap: 13 (ref 5–15)
BUN: 17 mg/dL (ref 8–23)
CO2: 20 mmol/L — ABNORMAL LOW (ref 22–32)
Calcium: 8.2 mg/dL — ABNORMAL LOW (ref 8.9–10.3)
Chloride: 94 mmol/L — ABNORMAL LOW (ref 98–111)
Creatinine, Ser: 4.77 mg/dL — ABNORMAL HIGH (ref 0.61–1.24)
GFR, Estimated: 13 mL/min — ABNORMAL LOW (ref >60)
Glucose, Bld: 113 mg/dL — ABNORMAL HIGH (ref 70–99)
Potassium: 3.4 mmol/L — ABNORMAL LOW (ref 3.5–5.1)
Sodium: 127 mmol/L — ABNORMAL LOW (ref 135–145)

## 2022-10-03 LAB — HEPATIC FUNCTION PANEL
ALT: 14 U/L (ref 0–44)
AST: 34 U/L (ref 15–41)
Albumin: 2.4 g/dL — ABNORMAL LOW (ref 3.5–5.0)
Alkaline Phosphatase: 88 U/L (ref 38–126)
Bilirubin, Direct: 1.2 mg/dL — ABNORMAL HIGH (ref 0.0–0.2)
Indirect Bilirubin: 0.9 mg/dL (ref 0.3–0.9)
Total Bilirubin: 2.1 mg/dL — ABNORMAL HIGH (ref 0.3–1.2)
Total Protein: 6.3 g/dL — ABNORMAL LOW (ref 6.5–8.1)

## 2022-10-03 LAB — PROTIME-INR
INR: 1.7 — ABNORMAL HIGH (ref 0.8–1.2)
Prothrombin Time: 19.3 seconds — ABNORMAL HIGH (ref 11.4–15.2)

## 2022-10-03 LAB — GLUCOSE, PLEURAL OR PERITONEAL FLUID: Glucose, Fluid: 55 mg/dL

## 2022-10-03 LAB — RESP PANEL BY RT-PCR (RSV, FLU A&B, COVID)  RVPGX2
Influenza A by PCR: NEGATIVE
Influenza B by PCR: NEGATIVE
Resp Syncytial Virus by PCR: NEGATIVE
SARS Coronavirus 2 by RT PCR: NEGATIVE

## 2022-10-03 LAB — LACTATE DEHYDROGENASE, PLEURAL OR PERITONEAL FLUID: LD, Fluid: 65 U/L — ABNORMAL HIGH (ref 3–23)

## 2022-10-03 LAB — BRAIN NATRIURETIC PEPTIDE: B Natriuretic Peptide: >4500 pg/mL — ABNORMAL HIGH (ref 0.0–100.0)

## 2022-10-03 LAB — CREATININE, SERUM
Creatinine, Ser: 3.87 mg/dL — ABNORMAL HIGH (ref 0.61–1.24)
GFR, Estimated: 16 mL/min — ABNORMAL LOW (ref >60)

## 2022-10-03 LAB — GLUCOSE, CAPILLARY
Glucose-Capillary: 62 mg/dL — ABNORMAL LOW (ref 70–99)
Glucose-Capillary: 55 mg/dL — ABNORMAL LOW (ref 70–99)
Glucose-Capillary: 88 mg/dL (ref 70–99)

## 2022-10-03 LAB — TROPONIN I (HIGH SENSITIVITY)
Troponin I (High Sensitivity): 59 ng/L — ABNORMAL HIGH (ref <18)
Troponin I (High Sensitivity): 62 ng/L — ABNORMAL HIGH (ref <18)

## 2022-10-03 LAB — APTT: aPTT: 48 seconds — ABNORMAL HIGH (ref 24–36)

## 2022-10-03 LAB — ETHANOL: Alcohol, Ethyl (B): <10 mg/dL (ref <10)

## 2022-10-03 LAB — AMMONIA: Ammonia: 34 umol/L (ref 9–35)

## 2022-10-03 LAB — OSMOLALITY: Osmolality: 273 mOsm/kg — ABNORMAL LOW (ref 275–295)

## 2022-10-03 LAB — LIPASE, BLOOD: Lipase: 26 U/L (ref 11–51)

## 2022-10-03 LAB — MAGNESIUM: Magnesium: 1.4 mg/dL — ABNORMAL LOW (ref 1.7–2.4)

## 2022-10-03 MED ORDER — HEPARIN SODIUM (PORCINE) 5000 UNIT/ML IJ SOLN
5000.0000 [IU] | Freq: Three times a day (TID) | INTRAMUSCULAR | Status: DC
  Administered 2022-10-03 – 2022-10-13 (×29): 5000 [IU] via SUBCUTANEOUS
  Filled 2022-10-03 (×29): qty 1

## 2022-10-03 MED ORDER — SODIUM CHLORIDE 0.9 % IV BOLUS
500.0000 mL | Freq: Once | INTRAVENOUS | Status: AC
  Administered 2022-10-03: 500 mL via INTRAVENOUS

## 2022-10-03 MED ORDER — CHLORHEXIDINE GLUCONATE CLOTH 2 % EX PADS
6.0000 | MEDICATED_PAD | Freq: Every day | CUTANEOUS | Status: DC
  Administered 2022-10-03 – 2022-10-10 (×2): 6 via TOPICAL

## 2022-10-03 MED ORDER — VANCOMYCIN HCL 1750 MG/350ML IV SOLN
1750.0000 mg | Freq: Once | INTRAVENOUS | Status: AC
  Administered 2022-10-03: 1750 mg via INTRAVENOUS
  Filled 2022-10-03: qty 350

## 2022-10-03 MED ORDER — DEXTROSE 50 % IV SOLN
INTRAVENOUS | Status: AC
  Administered 2022-10-03: 12.5 g via INTRAVENOUS
  Filled 2022-10-03: qty 50

## 2022-10-03 MED ORDER — FOLIC ACID 1 MG PO TABS
1.0000 mg | ORAL_TABLET | Freq: Every day | ORAL | Status: DC
  Administered 2022-10-03 – 2022-10-12 (×9): 1 mg via ORAL
  Filled 2022-10-03 (×10): qty 1

## 2022-10-03 MED ORDER — SODIUM CHLORIDE 0.9 % IV SOLN: 250.0000 mL | INTRAVENOUS | Status: DC

## 2022-10-03 MED ORDER — DEXTROSE 50 % IV SOLN
INTRAVENOUS | Status: AC
  Administered 2022-10-03: 50 mL via INTRAVENOUS
  Filled 2022-10-03: qty 50

## 2022-10-03 MED ORDER — SODIUM CHLORIDE 0.9 % IV SOLN
500.0000 mg | INTRAVENOUS | Status: DC
  Administered 2022-10-04 (×2): 500 mg via INTRAVENOUS
  Filled 2022-10-03 (×2): qty 5

## 2022-10-03 MED ORDER — IPRATROPIUM-ALBUTEROL 0.5-2.5 (3) MG/3ML IN SOLN
3.0000 mL | RESPIRATORY_TRACT | Status: DC | PRN
  Administered 2022-10-06: 3 mL via RESPIRATORY_TRACT
  Filled 2022-10-03: qty 3

## 2022-10-03 MED ORDER — DEXTROSE 50 % IV SOLN
1.0000 | Freq: Once | INTRAVENOUS | Status: AC
  Administered 2022-10-03: 50 mL via INTRAVENOUS
  Filled 2022-10-03: qty 50

## 2022-10-03 MED ORDER — ARFORMOTEROL TARTRATE 15 MCG/2ML IN NEBU
15.0000 ug | INHALATION_SOLUTION | Freq: Two times a day (BID) | RESPIRATORY_TRACT | Status: DC
  Filled 2022-10-03 (×2): qty 2

## 2022-10-03 MED ORDER — NOREPINEPHRINE 4 MG/250ML-% IV SOLN
2.0000 ug/min | INTRAVENOUS | Status: DC
  Administered 2022-10-04: 8 ug/min via INTRAVENOUS
  Administered 2022-10-04: 9 ug/min via INTRAVENOUS
  Administered 2022-10-04 – 2022-10-05 (×2): 10 ug/min via INTRAVENOUS
  Filled 2022-10-03 (×4): qty 250

## 2022-10-03 MED ORDER — DEXTROSE 50 % IV SOLN: 1.0000 | Freq: Once | INTRAVENOUS | Status: AC

## 2022-10-03 MED ORDER — DEXTROSE 50 % IV SOLN: 12.5000 g | INTRAVENOUS | Status: AC

## 2022-10-03 MED ORDER — SODIUM CHLORIDE 0.9 % IV SOLN
2.0000 g | INTRAVENOUS | Status: DC
  Administered 2022-10-03 – 2022-10-05 (×2): 2 g via INTRAVENOUS
  Filled 2022-10-03 (×2): qty 20

## 2022-10-03 MED ORDER — ADULT MULTIVITAMIN W/MINERALS CH
1.0000 | ORAL_TABLET | Freq: Every day | ORAL | Status: DC
  Administered 2022-10-03 – 2022-10-12 (×9): 1 via ORAL
  Filled 2022-10-03 (×10): qty 1

## 2022-10-03 MED ORDER — BUDESONIDE 0.5 MG/2ML IN SUSP
0.5000 mg | Freq: Two times a day (BID) | RESPIRATORY_TRACT | Status: DC
  Administered 2022-10-04 – 2022-10-12 (×17): 0.5 mg via RESPIRATORY_TRACT
  Filled 2022-10-03 (×18): qty 2

## 2022-10-03 MED ORDER — THIAMINE HCL 100 MG/ML IJ SOLN
100.0000 mg | Freq: Every day | INTRAMUSCULAR | Status: DC
  Administered 2022-10-03 – 2022-10-04 (×2): 100 mg via INTRAVENOUS
  Filled 2022-10-03 (×2): qty 2

## 2022-10-03 MED ORDER — LACTATED RINGERS IV BOLUS
500.0000 mL | Freq: Once | INTRAVENOUS | Status: AC
  Administered 2022-10-03: 500 mL via INTRAVENOUS

## 2022-10-03 MED ORDER — DEXTROSE 10 % IV SOLN: INTRAVENOUS | Status: DC

## 2022-10-03 MED ORDER — ALBUMIN HUMAN 25 % IV SOLN
50.0000 g | Freq: Once | INTRAVENOUS | Status: AC
  Administered 2022-10-03: 50 g via INTRAVENOUS
  Filled 2022-10-03: qty 200

## 2022-10-03 MED ORDER — ACETAMINOPHEN 325 MG PO TABS
650.0000 mg | ORAL_TABLET | ORAL | Status: DC | PRN
  Administered 2022-10-05 – 2022-10-06 (×3): 650 mg via ORAL
  Filled 2022-10-03 (×3): qty 2

## 2022-10-03 MED ORDER — POLYETHYLENE GLYCOL 3350 17 G PO PACK: 17.0000 g | PACK | Freq: Every day | ORAL | Status: DC | PRN

## 2022-10-03 MED ORDER — MIDODRINE HCL 5 MG PO TABS
10.0000 mg | ORAL_TABLET | Freq: Three times a day (TID) | ORAL | Status: DC
  Administered 2022-10-03 – 2022-10-06 (×8): 10 mg via ORAL
  Filled 2022-10-03 (×8): qty 2

## 2022-10-03 MED ORDER — VANCOMYCIN HCL IN DEXTROSE 1-5 GM/200ML-% IV SOLN: 1000.0000 mg | INTRAVENOUS | Status: DC

## 2022-10-03 MED ORDER — SODIUM CHLORIDE 0.9 % IV SOLN
2.0000 g | Freq: Once | INTRAVENOUS | Status: AC
  Administered 2022-10-03: 2 g via INTRAVENOUS
  Filled 2022-10-03: qty 12.5

## 2022-10-03 MED ORDER — VANCOMYCIN HCL IN DEXTROSE 1-5 GM/200ML-% IV SOLN: 1000.0000 mg | Freq: Once | INTRAVENOUS | Status: DC

## 2022-10-03 MED ORDER — SODIUM CHLORIDE 0.9 % IV SOLN
250.0000 mL | INTRAVENOUS | Status: DC
  Administered 2022-10-03 – 2022-10-07 (×2): 250 mL via INTRAVENOUS

## 2022-10-03 MED ORDER — SODIUM CHLORIDE 0.9 % IV SOLN: 1.0000 g | INTRAVENOUS | Status: DC

## 2022-10-03 MED ORDER — METRONIDAZOLE 500 MG/100ML IV SOLN
500.0000 mg | Freq: Once | INTRAVENOUS | Status: AC
  Administered 2022-10-03: 500 mg via INTRAVENOUS
  Filled 2022-10-03: qty 100

## 2022-10-03 MED ORDER — NOREPINEPHRINE 4 MG/250ML-% IV SOLN
2.0000 ug/min | INTRAVENOUS | Status: DC
  Administered 2022-10-03: 2 ug/min via INTRAVENOUS
  Filled 2022-10-03: qty 250

## 2022-10-03 MED ORDER — DOCUSATE SODIUM 100 MG PO CAPS
100.0000 mg | ORAL_CAPSULE | Freq: Two times a day (BID) | ORAL | Status: DC | PRN
  Administered 2022-10-05: 100 mg via ORAL
  Filled 2022-10-03: qty 1

## 2022-10-03 NOTE — Sepsis Progress Note (Signed)
Secure chat with bedside nurse regarding fluid bolus for the sepsis protocol. Full amount of fluids not given due to concerns for fluid overload with EF 15% and dialysis patient.

## 2022-10-03 NOTE — Consult Note (Signed)
Renal Service Consult Note [name]  [name] 10/03/2022 Sol Blazing, MD Requesting Physician: Dr. Duwayne Heck  Reason for Consult: ESRD pt w/ sepsis HPI: The patient is a 67 y.o. year-old w/ PMH as below who presented w/ AMS, slurred speech from home to ED this afternoon 1/28. In ED low BS, hypoxic at 88% on RA, placed on 8L. In ED pt c/o SOB/cough, abd distension, wheezing. BP's were in the 60s-80s initially, he rec'd IV bolus fluids (1.5 L), IV albumin and IV pressor support. IV abx were also started. Pt was hypothermic so a warming blanket was placed. Now temps are up to 97.4 deg F. Pt to be admitted to [facility] service. Asked to see for ESRD.   Pt states he does HD in [place], for about the last 2 years. Last HD was Friday. He has a L upper arm AVF and R IJ TDC, he is not sure why he has both. Sometime one or the other "might not be working".     ROS - denies CP, no joint pain, no HA, no blurry vision, no rash, no diarrhea, no nausea/ vomiting, no dysuria, no difficulty voiding   Past Medical History  Past Medical History:  Diagnosis Date   AKI (acute kidney injury) ([place]) 02/2020   stage 4   CHF (congestive heart failure) ([place])    Diabetes mellitus without complication ([place])    diet controlled   Dyspnea    Hypertension    Past Surgical History  Past Surgical History:  Procedure Laterality Date   A/V FISTULAGRAM Left 03/31/2022   Procedure: A/V Fistulagram;  Surgeon: [name], MD;  Location: [facility];  Service: Cardiovascular;  Laterality: Left;   AV FISTULA PLACEMENT Left 08/11/2021   Procedure: LEFT ARM BRACHIOCEPHALIC ARTERIOVENOUS (AV) FISTULA CREATION;  Surgeon: [name], MD;  Location: [facility];  Service: Vascular;  Laterality: Left;   IR PARACENTESIS  02/23/2022   IR PARACENTESIS  03/18/2022   IR PARACENTESIS  04/13/2022   IR PARACENTESIS  05/13/2022   WISDOM TOOTH EXTRACTION     Family History  Family History  Problem  Relation Age of Onset   Stroke Mother    Stroke Father    Social History  reports that he has never smoked. He quit smokeless tobacco use about 3 years ago.  His smokeless tobacco use included chew. He reports that he does not currently use alcohol. He reports that he does not use drugs. Allergies  Allergies  Allergen Reactions   Grass Pollen(K-O-R-T-Swt Vern) Other (See Comments)    Eyes run, shortness of breath, itching    Penicillins Other (See Comments)    Unknown reaction   Home medications Prior to Admission medications   Medication Sig Start Date End Date Taking? Authorizing Provider  albuterol (VENTOLIN HFA) 108 (90 Base) MCG/ACT inhaler Can inhale two puffs every four to six hours as needed for cough, wheeze, shortness of breath, or chest tightness. 03/26/20   Kozlow, Donnamarie Poag, MD  SYMBICORT 160-4.5 MCG/ACT inhaler INHALE 2 PUFFS INTO THE LUNGS TWICE DAILY. RINSE, GARGLE, AND SPIT AFTER USE. Patient taking differently: Inhale 2 puffs into the lungs 2 (two) times daily as needed (respiratory issues.). 09/10/21   Kennith Gain, MD     Vitals:   10/03/22 2000 10/03/22 2015 10/03/22 2030 10/03/22 2045  BP: (!) 79/60 96/64 (!) 85/67 97/66  Pulse: 67 73 75 73  Resp: '18 17 16 14  '$ Temp:  TempSrc:      SpO2: 94% 95% 94% 98%  Weight:      Height:       Exam Gen lethargic, acutely/ chronically ill appearing No rash, cyanosis or gangrene Sclera anicteric, throat clear  No jvd or bruits Chest clear bilat to bases, no rales/ wheezing RRR no MRG Abd soft ntnd no mass, 2-3+ ascites  GU normal male MS no joint effusions or deformity Ext some mild bilat lower pretib erythema and 1+ edema Neuro is lethargic, arouses and answers questions, Ox 3    RIJ TDC in the chest, LUA AVF +bruit    Home meds include - symbicort, albuterol, ??    OP HD:  MWF 4h  400/600  86.9kg  3K/2.5Ca bath  TDC  RIJ + LUA AVF  Heparin none - last HD 1/26, post wt 88.3kg   - soft  BP's at HD in 90s for last 2 wks at minimum - venofer '50mg'$  weekly - mircera 30 mcg q4 wks, last on 1/15, due 2/12   Assessment/ Plan: Sepsis/ shock - w/ hypothermia, indwelling TDC for HD. Getting empiric IV abx Hypotension - getting levophed gtt in ED. Will be admitted to ICU.  ESRD - on HD MWF. Last HD was Friday. Plan is for HD tomorrow.  HD access - has TDC and L upper arm AVF. Not sure which is not working well, get records.  BP/ volume - BP's low and slowly getting better, on pressors. Min UF w/ HD Anemia esrd - Hb 12, no esa needs MBD ckd - CCa in range, will add on phos   Rob Deunta Beneke  MD 10/03/2022, 9:12 PM Recent Labs  Lab 10/03/22 1447 10/03/22 1450 10/03/22 2000  HGB 12.1*  --  12.9*  ALBUMIN  --  2.4*  --   CALCIUM  --  8.2*  --   CREATININE  --  4.77*  --   K  --  3.4* 3.7   Inpatient medications:  arformoterol  15 mcg Nebulization Q12H   budesonide (PULMICORT) nebulizer solution  0.5 mg Nebulization BID   heparin  5,000 Units Subcutaneous Q8H   midodrine  10 mg Oral Q8H    sodium chloride Stopped (10/03/22 1919)   sodium chloride     albumin human     [START ON 10/04/2022] ceFEPime (MAXIPIME) IV     norepinephrine (LEVOPHED) Adult infusion     [START ON 10/04/2022] vancomycin     acetaminophen, docusate sodium, ipratropium-albuterol, polyethylene glycol

## 2022-10-03 NOTE — H&P (Signed)
NAME:  RONTAE VINAS, MRN:  [id number], DOB:  25-Jan-1956, LOS: 0 ADMISSION DATE:  10/03/2022, CONSULTATION DATE:  1/28 REFERRING MD:  Dr. Pearline Cables, CHIEF COMPLAINT:  Septic shock   History of Present Illness:  Patient is a 67 year old male with pertinent PMH ESRD on HD MWF, HFrEF (LVEF 20%), DMT2, prior EtOH abuse with cirrhosis presents to [facility] ED on 1/28 with AMS.  Per patient's wife, patient has been having increasing productive cough with yellow phlegm over the past day.  Patient received dialysis on 1/26 but did not finish session due to SOB.  Patient became increasingly altered and came to [facility] ED for further eval.  Upon arrival to [facility] ED on 1/28, patient altered/confused.  CBG 40s given dextrose with some improvement in mental status.  Patient hypoxic 80s on room air and was placed on [place].  SBP 70-80s.  Hypothermic 92.8 F.  Sepsis protocol initiated.  Cultures obtained and started on broad-spectrum antibiotics.  Given 1.5L of IV fluids and albumin.  Patient remained hypotensive and required levo.  CT chest/abdomen/pelvis showing mild patchy opacities in RML; cirrhosis with moderate ascites.  Diagnostic paracentesis performed by ED physician.  PCCM consulted for ICU admission.  Pertinent ED labs: WBC 13.5, Hgb 12.1, platelets 66, troponin 59 then 62, ammonia 34, ethanol WNL, NA 127, K3.4, CO2 20, LA 3.4 then 3.6 then 3.2, BNP greater than 4500, COVID/flu/RSV negative, VBG 7.24, 53, 40, 23.  Pertinent  Medical History   Past Medical History:  Diagnosis Date   AKI (acute kidney injury) ([place]) 02/2020   stage 4   CHF (congestive heart failure) ([facility])    Diabetes mellitus without complication ([place])    diet controlled   Dyspnea    Hypertension      Significant Hospital Events: Including procedures, antibiotic start and stop dates in addition to other pertinent events   1/28 admitted to [facility] septic shock on levo  Interim History / Subjective:  See above  Objective   Blood pressure (!)  81/55, pulse 70, temperature (!) 97.4 F (36.3 C), temperature source Oral, resp. rate 19, height 6' (1.829 m), weight 83.9 kg, SpO2 92 %.        Intake/Output Summary (Last 24 hours) at 10/03/2022 2009 Last data filed at 10/03/2022 1905 Gross per 24 hour  Intake 2250 ml  Output --  Net 2250 ml   Filed Weights   10/03/22 1451  Weight: 83.9 kg    Examination: General:   ill appearing male in nad HEENT: MM pink/dry; [place] in place Neuro: AO, MAE; mildly confused; PERRL CV: s1s2, RRR, no m/r/g PULM:  dim clear BS bilaterally; [place] 4L GI: soft, bsx4 active  Extremities: warm/dry, no edema; R tunneled HD cath without erythema around site Skin: no rashes or lesions    Resolved Hospital Problem list     Assessment & Plan:   Septic shock: possible RML pneumonia; Last echo 2022 LVEF 20-25% Hypothermia Plan: -cont levo for map goal >65 -will give another albumin; hold further IV fluids given concern for volume overload -midodrine 10 q8 -change abx to rocephin/azithro for cap ppx -follow cultures, urine legionella/strept; rvp -check PCT -trend LA and trop -trend wbc/fever curve -check echo  Acute respiratory failure w/ hypoxia Asthma?: on symbicort at home Possible RML pneumonia  Plan: -wean [place] for sats >92% -pulmicort/brovana; prn duoneb -pulm toiletry: is/flutter -abx as above for cap ppx -follow exectorated sputum, rvp, urine legionella/strept  Acute encephalopathy -likely hypoglycemia and sepsis related -ammonia and ethanol  wnl Plan: -cbg monitoring; dextrose for normoglycemia -treat sepsis as above -f/u UA, UC, UDS -limit sedating meds  Alcoholic cirrhosis w/ moderate ascites -Last paracentesis about 1 week ago pulling 7L of fluid per spouse Plan: -diagnostic paracentesis performed in ED; follow culture -will need repeat paracentesis when hemodynamically stable  -albumin given -nephro consulted for HD needs and volume removal; will likely need CRRT given  hypotension  Hypoglycemia DMT2 Plan: -check A1c -cbg monitoring -hold ssi  HFrEF: echo 2022 LVEF 20% HTN Plan: -hold anti-hypertensives -dialysis per nephro for volume removal -check echo  ESRD on HD MWF Chronic hyponatremia Hypokalemia Mild metabolic acidosis -R tunneled HD cath in place Plan: -nephro consulted; will likely need CRRT -K 3.4 then 3.7 on istat; will hold on repleting -Trend BMP / Mag / urinary output -Replace electrolytes as indicated -Avoid nephrotoxic agents, ensure adequate renal perfusion  Hx of prior etoh abuse -denies any current alcohol use Plan: -ethanol wnl -thiamine, folic acid, mvi  Anemia of chronic disease Thrombocytopenia: likely sepsis related Plan: -trend cbc  Best Practice (right click and "Reselect all SmartList Selections" daily)   Diet/type: NPO w/ oral meds DVT prophylaxis: prophylactic heparin  GI prophylaxis: N/A Lines: N/A Foley:  Yes, and it is still needed Code Status:  full code Last date of multidisciplinary goals of care discussion [1/28 Updated wife over phone]  Labs   CBC: Recent Labs  Lab 10/03/22 1447 10/03/22 2000  WBC 13.5*  --   NEUTROABS 12.0*  --   HGB 12.1* 12.9*  HCT 38.6* 38.0*  MCV 94.8  --   PLT 66*  --     Basic Metabolic Panel: Recent Labs  Lab 10/03/22 1450 10/03/22 2000  NA 127* 132*  K 3.4* 3.7  CL 94*  --   CO2 20*  --   GLUCOSE 113*  --   BUN 17  --   CREATININE 4.77*  --   CALCIUM 8.2*  --    GFR: Estimated Creatinine Clearance: 16.9 mL/min (A) (by C-G formula based on SCr of 4.77 mg/dL (H)). Recent Labs  Lab 10/03/22 1447 10/03/22 1455 10/03/22 1704  WBC 13.5*  --   --   LATICACIDVEN  --  3.4* 3.6*    Liver Function Tests: Recent Labs  Lab 10/03/22 1450  AST 34  ALT 14  ALKPHOS 88  BILITOT 2.1*  PROT 6.3*  ALBUMIN 2.4*   Recent Labs  Lab 10/03/22 1447  LIPASE 26   Recent Labs  Lab 10/03/22 1449  AMMONIA 34    ABG    Component Value  Date/Time   HCO3 23.2 10/03/2022 2000   TCO2 25 10/03/2022 2000   ACIDBASEDEF 5.0 (H) 10/03/2022 2000   O2SAT 65 10/03/2022 2000     Coagulation Profile: Recent Labs  Lab 10/03/22 1447  INR 1.7*    Cardiac Enzymes: No results for input(s): "CKTOTAL", "CKMB", "CKMBINDEX", "TROPONINI" in the last 168 hours.  HbA1C: Hgb A1c MFr Bld  Date/Time Value Ref Range Status  03/04/2020 03:25 AM 6.8 (H) 4.8 - 5.6 % Final    Comment:    (NOTE) Pre diabetes:          5.7%-6.4%  Diabetes:              >6.4%  Glycemic control for   <7.0% adults with diabetes     CBG: Recent Labs  Lab 10/03/22 1438 10/03/22 1532 10/03/22 1735 10/03/22 1906  GLUCAP 42* 108* 69* 94    Review of Systems:  Patient is encephalopathic. Therefore history has been obtained from chart review.    Past Medical History:  He,  has a past medical history of AKI (acute kidney injury) ([place]) (02/2020), CHF (congestive heart failure) ([place]), Diabetes mellitus without complication ([place]), Dyspnea, and Hypertension.   Surgical History:   Past Surgical History:  Procedure Laterality Date   A/V FISTULAGRAM Left 03/31/2022   Procedure: A/V Fistulagram;  Surgeon: Broadus [place] Oborn, MD;  Location: [facility];  Service: Cardiovascular;  Laterality: Left;   AV FISTULA PLACEMENT Left 08/11/2021   Procedure: LEFT ARM BRACHIOCEPHALIC ARTERIOVENOUS (AV) FISTULA CREATION;  Surgeon: Broadus Carmelita Amparo, MD;  Location: [facility];  Service: Vascular;  Laterality: Left;   IR PARACENTESIS  02/23/2022   IR PARACENTESIS  03/18/2022   IR PARACENTESIS  04/13/2022   IR PARACENTESIS  05/13/2022   WISDOM TOOTH EXTRACTION       Social History:   reports that he has never smoked. He quit smokeless tobacco use about 3 years ago.  His smokeless tobacco use included chew. He reports that he does not currently use alcohol. He reports that he does not use drugs.   Family History:  His family history includes Stroke in his father and mother.    Allergies Allergies  Allergen Reactions   Grass Pollen(K-O-R-T-Swt Vern) Other (See Comments)    Eyes run, shortness of breath, itching    Penicillins Other (See Comments)    Unknown reaction     Home Medications  Prior to Admission medications   Medication Sig Start Date End Date Taking? Authorizing Provider  albuterol (VENTOLIN HFA) 108 (90 Base) MCG/ACT inhaler Can inhale two puffs every four to six hours as needed for cough, wheeze, shortness of breath, or chest tightness. 03/26/20   Kozlow, Donnamarie Poag, MD  SYMBICORT 160-4.5 MCG/ACT inhaler INHALE 2 PUFFS INTO THE LUNGS TWICE DAILY. RINSE, GARGLE, AND SPIT AFTER USE. Patient taking differently: Inhale 2 puffs into the lungs 2 (two) times daily as needed (respiratory issues.). 09/10/21   Kennith Gain, MD     Critical care time: 45 minutes     JD Geryl Rankins [facility] 10/03/2022, 8:09 PM  Please see [email or web address] for pager details.  From 7A-7P if no response, please call [phone]. After hours, please call ELink [phone].

## 2022-10-03 NOTE — Sepsis Progress Note (Signed)
Sepsis protocol is being followed by eLink. 

## 2022-10-03 NOTE — Progress Notes (Addendum)
eLink Physician-Brief Progress Note Patient Name: DORIEN MAYOTTE DOB: 01-27-1956 MRN: [id number]   Date of Service  10/03/2022  HPI/Events of Note  52M with chronic systolic heart failure, DM, ESRD, cirrhosis who presented with initial slurred speech and found with hypoglycemia, hypotension and hypoxemia and abdominal pain x 2 days. In the ED give D50 x 2 for hypoglycemia. Paracentesis with 1.5L removed. Vanc, cefepime and flagyl given  CT CAP mild dependent patchy opacities in lungs bilaterally, cirrhosis with ascites  In the ICU, patient eyes open and drowsy. Protecting airway. CBG 55. Given D50  eICU Interventions  S/p 1L LR Antibiotics ordered. Ceftriaxone and azithro Bcx, urine and sputum sent Wean levophed for MAP goal >60 Albumin Midodrine ordered Folic acid, thiamine Recheck CBG   Start D10 gtt '@10cc'$ . Trend CBG  Intervention Category Evaluation Type: New Patient Evaluation  Katasha Riga Rodman Pickle 10/03/2022, 10:37 PM

## 2022-10-03 NOTE — ED Notes (Signed)
Benjamin [name] (Spouse) called asking for an update. Her Number is [phone].

## 2022-10-03 NOTE — Progress Notes (Signed)
Pharmacy Antibiotic Note  Benjamin [name] is a 67 y.o. male admitted on 10/03/2022 presenting with AMS and concern for sepsis.  Pharmacy has been consulted for Vancomycin and cefepime dosing.  ESRD-HD usually MWF  Plan: Vancomycin 1750 mg IV x 1, then 1g IV q HD Cefepime 2g IV x 1, then 1g IV every 24 hours Monitor iHD schedule, Cx and clinical progression to narrow Vancomycin random level as needed  Height: 6' (182.9 cm) Weight: 83.9 kg (185 lb) IBW/kg (Calculated) : 77.6  Temp (24hrs), Avg:92.8 F (33.8 C), Min:92.8 F (33.8 C), Max:92.8 F (33.8 C)  Recent Labs  Lab 10/03/22 1447 10/03/22 1450  WBC 13.5*  --   CREATININE  --  4.77*    Estimated Creatinine Clearance: 16.9 mL/min (A) (by C-G formula based on SCr of 4.77 mg/dL (H)).    Allergies  Allergen Reactions   Grass Pollen(K-O-R-T-Swt Vern) Other (See Comments)    Eyes run, shortness of breath, itching    Penicillins Other (See Comments)    Unknown reaction    Bertis Ruddy, PharmD, [place] Pharmacist ED Pharmacist Phone # [phone] 10/03/2022 4:09 PM

## 2022-10-03 NOTE — Progress Notes (Signed)
  Attending MD note  Patient was independently seen and examined, treatment plan was discussed with the  Advance Practice Provider. I agree with the above note by Mikki Harbor, PA.  I have personally reviewed the clinical findings, labs, ECG, imaging studies and management of this patient in detail. I have also reviewed the orders written for this patient which were under my direction. I agree with the documentation, as recorded by the Advance Practice Provider.   Briefly, Benjamin [name] is a 67 y.o. male w Hx of HFrEF,  DM, CKD on HD MWF, alcoholic cirrhosis, presented from EMS with slurred speech, found to have hypoglycemia, hypothermia, hypotension, hypoxemia requiring 4 L [place]. Slurred speech was not present on arrival to ED.  Patient also noted  increasing abdominal distension x 2 days, as well as Cough, congestion and mucus production x 1 day.    Only had 1/2 session on HD on Friday.   In ED: resp acidosis on ABG (mild) ,mild met acidosis BNP >4500 Trop62  Diagnostic paracentesis done.  LR 1.5L plus 263m albumin 25%  Cefepime, vanc, flagyl given.  Lact 3.4--> 3.6  Plt 66 inr 1.7  Echo afib Blood cultures were collected.    WBC 13.5  Hyponatremia 127,  Covid and flu neg  CXR clear  Subjective:   Objective: Vitals:   10/03/22 1930 10/03/22 1945  BP: (!) 80/58 (!) 81/55  Pulse: 69 70  Resp: 20 19  Temp:    SpO2: 91% 92%    FiO2 (%):  [30 %] 30 % No intake or output data in the 24 hours ending 07/05/18 2358   General:   Neuro:   HEENT:  [place]/AT, No JVD noted, PERRL Cardiovascular:  RRR, no MRG Lungs:   Abdomen:  Soft, non-distended Musculoskeletal:  No acute deformity Skin:  Intact, MMM  CXR images   Impression/Plan:  Plan per JD Payne's note.   Critical Care time devoted to patient care services described in this note is 35 minutes, not including time spent on procedures, teaching or supervising.    NCollier Bullock MD

## 2022-10-03 NOTE — Progress Notes (Signed)
An USGPIV (ultrasound guided PIV) has been placed for short-term vasopressor infusion. A correctly placed ivWatch must be used when administering Vasopressors. Should this treatment be needed beyond 72 hours, central line access should be obtained.  It will be the responsibility of the bedside nurse to follow best practice to prevent extravasations.  HS Eura Radabaugh RN 

## 2022-10-03 NOTE — ED Notes (Signed)
Bear hugger placed on patient. MD aware

## 2022-10-03 NOTE — ED Provider Notes (Signed)
[facility] Provider Note  CSN: [id number] Arrival date & time: 10/03/22 1429  Chief Complaint(s) Hypotension and Hypoglycemia  HPI Benjamin [name] is a 67 y.o. 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  Patient arrived from home by EMS, they were called to the house for AMS.  Found to have low glucose in the 40s, given oral dextrose and his mental status did improve precipitously.  Patient was hypoxic 80% on room air, placed on 6 L nasal cannula improvement to mid 90s.  Also hypertensive systolic 18E to 99B.  No fluids prior to arrival.  Patient reports worsening congestion, cough, difficulty breathing, mucus production.  Productive cough with yellow phlegm over the past 24 hours.  Patient ports his abdomen is become more distended over the past 2 days.  Patient does get frequent paracentesis, last paracentesis around a week ago with approximately 7 L of fluid removed per spouse  Past Medical History Past Medical History:  Diagnosis Date   AKI (acute kidney injury) ([place]) 02/2020   stage 4   CHF (congestive heart failure) ([place])    Diabetes mellitus without complication ([place])    diet controlled   Dyspnea    Hypertension    Patient Active Problem List   Diagnosis Date Noted   Chronic systolic CHF (congestive heart failure) ([place]) 08/04/2021   Diabetes mellitus type 2 in nonobese ([place]) 08/04/2021   Hyponatremia 03/26/2021   CKD (chronic kidney disease) stage 5, GFR 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 ([facility]) 03/26/2021   Acute renal failure (ARF) ([place]) 03/04/2020   Hyperkalemia 71/69/6789   Metabolic acidosis 38/06/1750   Hypertensive urgency 03/04/2020   Anemia 03/04/2020   Home Medication(s) Prior to Admission  medications   Medication Sig Start Date End Date Taking? Authorizing Provider  albuterol (VENTOLIN HFA) 108 (90 Base) MCG/ACT inhaler Can inhale two puffs every four to six hours as needed for cough, wheeze, shortness of breath, or chest tightness. 03/26/20   Kozlow, Donnamarie Poag, MD  SYMBICORT 160-4.5 MCG/ACT inhaler INHALE 2 PUFFS INTO THE LUNGS TWICE DAILY. RINSE, GARGLE, AND SPIT AFTER USE. Patient taking differently: Inhale 2 puffs into the lungs 2 (two) times daily as needed (respiratory issues.). 09/10/21   Kennith Gain, MD                                                                                                                                    Past Surgical History Past Surgical History:  Procedure Laterality Date   A/V FISTULAGRAM Left 03/31/2022   Procedure: A/V Fistulagram;  Surgeon: [name], MD;  Location: [facility];  Service: Cardiovascular;  Laterality: Left;   AV FISTULA PLACEMENT Left  08/11/2021   Procedure: LEFT ARM BRACHIOCEPHALIC ARTERIOVENOUS (AV) FISTULA CREATION;  Surgeon: [name], MD;  Location: [facility] OR;  Service: Vascular;  Laterality: Left;   IR PARACENTESIS  02/23/2022   IR PARACENTESIS  03/18/2022   IR PARACENTESIS  04/13/2022   IR PARACENTESIS  05/13/2022   WISDOM TOOTH EXTRACTION     Family History Family History  Problem Relation Age of Onset   Stroke Mother    Stroke Father     Social History Social History   Tobacco Use   Smoking status: Never   Smokeless tobacco: Former    Types: Chew    Quit date: 2021  Vaping Use   Vaping Use: Never used  Substance Use Topics   Alcohol use: Not Currently   Drug use: Never   Allergies Grass pollen(k-o-[id number]-swt vern) and Penicillins  Review of Systems Review of Systems  Constitutional:  Positive for appetite change and fatigue. Negative for chills and fever.  HENT:  Negative for facial swelling and trouble swallowing.   Eyes:  Negative for photophobia and visual disturbance.   Respiratory:  Positive for cough and shortness of breath.   Cardiovascular:  Negative for chest pain and palpitations.  Gastrointestinal:  Positive for abdominal distention and abdominal pain. Negative for nausea and vomiting.  Endocrine: Negative for polydipsia and polyuria.  Genitourinary:  Negative for difficulty urinating and hematuria.  Musculoskeletal:  Negative for gait problem and joint swelling.  Skin:  Negative for pallor and rash.  Neurological:  Positive for weakness. Negative for syncope and headaches.  Psychiatric/Behavioral:  Negative for agitation and confusion.     Physical Exam Vital Signs  I have reviewed the triage vital signs BP 96/64   Pulse 73   Temp (!) 97.4 F (36.3 C) (Oral)   Resp 17   Ht 6' (1.829 m)   Wt 83.9 kg   SpO2 95%   BMI 25.09 kg/m  Physical Exam Vitals and nursing note reviewed. Exam conducted with a chaperone present.  Constitutional:      General: He is in acute distress.     Appearance: He is obese. He is ill-appearing.  HENT:     Head: Normocephalic and atraumatic.     Right Ear: External ear normal.     Left Ear: External ear normal.     Nose: Rhinorrhea present.     Mouth/Throat:     Mouth: Mucous membranes are dry.  Eyes:     General: Scleral icterus present.        Right eye: No discharge.        Left eye: No discharge.     Extraocular Movements: Extraocular movements intact.     Pupils: Pupils are equal, round, and reactive to light.  Cardiovascular:     Rate and Rhythm: Normal rate and regular rhythm.     Pulses: Normal pulses.     Heart sounds: Normal heart sounds.  Pulmonary:     Effort: Pulmonary effort is normal.     Breath sounds: No stridor. No wheezing.     Comments: Breath sounds diminished bilateral Abdominal:     General: Abdomen is flat. There is distension.     Tenderness: There is abdominal tenderness.  Musculoskeletal:     Cervical back: Rigidity present.     Right lower leg: No edema.  Skin:     General: Skin is warm and dry.     Capillary Refill: Capillary refill takes 2 to 3 seconds.     Comments:  Chronic vascular abnormalities to bilateral lower extremities  Neurological:     General: No focal deficit present.     Mental Status: He is alert and oriented to person, place, and time.     GCS: GCS eye subscore is 4. GCS verbal subscore is 5. GCS motor subscore is 6.     Cranial Nerves: Cranial nerves 2-12 are intact.     Sensory: Sensation is intact.     Motor: Motor function is intact.     Coordination: Coordination is intact.     Comments: Moving extremities spontaneously. Strength symmetric upper and lower extremities Gait not tested secondary to patient safety  Psychiatric:        Mood and Affect: Mood normal.        Behavior: Behavior normal.     ED Results and Treatments Labs (all labs ordered are listed, but only abnormal results are displayed) Labs Reviewed  CBC WITH DIFFERENTIAL/PLATELET - Abnormal; Notable for the following components:      Result Value   WBC 13.5 (*)    RBC 4.07 (*)    Hemoglobin 12.1 (*)    HCT 38.6 (*)    RDW 17.8 (*)    Platelets 66 (*)    Neutro Abs 12.0 (*)    Lymphs Abs 0.6 (*)    Abs Immature Granulocytes 0.09 (*)    All other components within normal limits  BRAIN NATRIURETIC PEPTIDE - Abnormal; Notable for the following components:   B Natriuretic Peptide >4,500.0 (*)    All other components within normal limits  PROTIME-INR - Abnormal; Notable for the following components:   Prothrombin Time 19.3 (*)    INR 1.7 (*)    All other components within normal limits  HEPATIC FUNCTION PANEL - Abnormal; Notable for the following components:   Total Protein 6.3 (*)    Albumin 2.4 (*)    Total Bilirubin 2.1 (*)    Bilirubin, Direct 1.2 (*)    All other components within normal limits  BASIC METABOLIC PANEL - Abnormal; Notable for the following components:   Sodium 127 (*)    Potassium 3.4 (*)    Chloride 94 (*)    CO2 20 (*)     Glucose, Bld 113 (*)    Creatinine, Ser 4.77 (*)    Calcium 8.2 (*)    GFR, Estimated 13 (*)    All other components within normal limits  LACTIC ACID, PLASMA - Abnormal; Notable for the following components:   Lactic Acid, Venous 3.4 (*)    All other components within normal limits  LACTIC ACID, PLASMA - Abnormal; Notable for the following components:   Lactic Acid, Venous 3.6 (*)    All other components within normal limits  APTT - Abnormal; Notable for the following components:   aPTT 48 (*)    All other components within normal limits  LACTATE DEHYDROGENASE, PLEURAL OR PERITONEAL FLUID - Abnormal; Notable for the following components:   LD, Fluid 65 (*)    All other components within normal limits  BODY FLUID CELL COUNT WITH DIFFERENTIAL - Abnormal; Notable for the following components:   Color, Fluid ORANGE (*)    Appearance, Fluid HAZY (*)    All other components within normal limits  CBG MONITORING, ED - Abnormal; Notable for the following components:   Glucose-Capillary 42 (*)    All other components within normal limits  CBG MONITORING, ED - Abnormal; Notable for the following components:   Glucose-Capillary 108 (*)  All other components within normal limits  CBG MONITORING, ED - Abnormal; Notable for the following components:   Glucose-Capillary 69 (*)    All other components within normal limits  I-STAT VENOUS BLOOD GAS, ED - Abnormal; Notable for the following components:   pH, Ven 7.247 (*)    Acid-base deficit 5.0 (*)    Sodium 132 (*)    Calcium, Ion 1.08 (*)    HCT 38.0 (*)    Hemoglobin 12.9 (*)    All other components within normal limits  TROPONIN I (HIGH SENSITIVITY) - Abnormal; Notable for the following components:   Troponin I (High Sensitivity) 59 (*)    All other components within normal limits  TROPONIN I (HIGH SENSITIVITY) - Abnormal; Notable for the following components:   Troponin I (High Sensitivity) 62 (*)    All other components within  normal limits  RESP PANEL BY RT-PCR (RSV, FLU A&B, COVID)  RVPGX2  BODY FLUID CULTURE W GRAM STAIN  CULTURE, BLOOD (ROUTINE X 2)  CULTURE, BLOOD (ROUTINE X 2)  LIPASE, BLOOD  AMMONIA  ETHANOL  GLUCOSE, PLEURAL OR PERITONEAL FLUID  PROTEIN, PLEURAL OR PERITONEAL FLUID  ALBUMIN, PLEURAL OR PERITONEAL FLUID   URINALYSIS, ROUTINE W REFLEX MICROSCOPIC  OSMOLALITY  LACTIC ACID, PLASMA  PATHOLOGIST SMEAR REVIEW  CBG MONITORING, ED                                                                                                                          Radiology CT CHEST ABDOMEN PELVIS WO CONTRAST  Result Date: 10/03/2022 CLINICAL DATA:  Hypoglycemia, altered mental status, slurred speech EXAM: CT CHEST, ABDOMEN AND PELVIS WITHOUT CONTRAST TECHNIQUE: Multidetector CT imaging of the chest, abdomen and pelvis was performed following the standard protocol without IV contrast. RADIATION DOSE REDUCTION: This exam was performed according to the departmental dose-optimization program which includes automated exposure control, adjustment of the mA and/or kV according to patient size and/or use of iterative reconstruction technique. COMPARISON:  CTA chest dated 09/24/2022. CT abdomen/pelvis dated 04/17/2021. FINDINGS: CT CHEST FINDINGS Cardiovascular: Mild cardiomegaly.  No pericardial effusion. No evidence of thoracic aortic aneurysm. Atherosclerotic calcifications of the aortic root and arch. Severe three-vessel coronary atherosclerosis. Right IJ dialysis catheter terminates in the lower right atrium. Mediastinum/Nodes: No suspicious mediastinal lymphadenopathy. Visualized thyroid is unremarkable. Lungs/Pleura: Evaluation lung parenchyma is constrained by respiratory motion. Within that constraint, there are no suspicious pulmonary nodules. Mild dependent patchy opacities in the lungs bilaterally, posterior right middle lobe (series 5/image 88) and left lower lobe predominant (series 5/image 111). This  appearance favors mild aspiration over atelectasis. No pleural effusion or pneumothorax. Musculoskeletal: Mild degenerative changes of the lower thoracic spine. Old healing and healed right rib fractures. CT ABDOMEN PELVIS FINDINGS Hepatobiliary: Cirrhotic configuration of the liver. Cholelithiasis, without associated inflammatory changes. No intrahepatic or extrahepatic duct dilatation. Pancreas: Within normal limits. Spleen: Within normal limits. Adrenals/Urinary Tract: Adrenal glands are within normal limits. Kidneys are within normal limits. No renal  calculi or hydronephrosis. Bladder is decompressed. Stomach/Bowel: Stomach is within normal limits. No evidence of bowel obstruction. Appendix is not discretely visualized. No colonic wall thickening or inflammatory changes. Vascular/Lymphatic: No evidence of abdominal aortic aneurysm. Atherosclerotic calcifications of the abdominal aorta and branch vessels. No suspicious abdominopelvic lymphadenopathy. Reproductive: Prostate is unremarkable. Other: Moderate abdominopelvic ascites, new from 2022 but unchanged in the upper abdomen from recent CT chest. Musculoskeletal: Degenerative changes of the lumbar spine. Old right stone superior and inferior pubic rami fracture deformities. IMPRESSION: Mild dependent patchy opacities in the lungs bilaterally, favoring mild aspiration over atelectasis. Cirrhosis. Moderate abdominopelvic ascites. Cholelithiasis, without associated inflammatory changes. Additional ancillary findings as above. Electronically Signed   By: Julian Hy M.D.   On: 10/03/2022 19:07   DG Chest Portable 1 View  Result Date: 10/03/2022 CLINICAL DATA:  Altered mental status. EXAM: PORTABLE CHEST 1 VIEW COMPARISON:  September 24, 2022. FINDINGS: Stable cardiomegaly. Right internal jugular dialysis catheter is unchanged. Both lungs are clear. The visualized skeletal structures are unremarkable. IMPRESSION: No active disease. Electronically Signed    By: Marijo Conception M.D.   On: 10/03/2022 15:47    Pertinent labs & imaging results that were available during my care of the patient were reviewed by me and considered in my medical decision making (see MDM for details).  Medications Ordered in ED Medications  ceFEPIme (MAXIPIME) 1 g in sodium chloride 0.9 % 100 mL IVPB (has no administration in time range)  vancomycin (VANCOCIN) IVPB 1000 mg/200 mL premix (has no administration in time range)  0.9 %  sodium chloride infusion (0 mLs Intravenous Hold 10/03/22 1919)  norepinephrine (LEVOPHED) '4mg'$  in 264m (0.016 mg/mL) premix infusion (8 mcg/min Intravenous Rate/Dose Change 10/03/22 1959)  sodium chloride 0.9 % bolus 500 mL (0 mLs Intravenous Stopped 10/03/22 1636)  albumin human 25 % solution 50 g (0 g Intravenous Stopped 10/03/22 1902)  ceFEPIme (MAXIPIME) 2 g in sodium chloride 0.9 % 100 mL IVPB (0 g Intravenous Stopped 10/03/22 1557)  metroNIDAZOLE (FLAGYL) IVPB 500 mg (0 mg Intravenous Stopped 10/03/22 1700)  vancomycin (VANCOREADY) IVPB 1750 mg/350 mL (0 mg Intravenous Stopped 10/03/22 1905)  dextrose 50 % solution 50 mL (50 mLs Intravenous Given 10/03/22 1445)  lactated ringers bolus 500 mL (0 mLs Intravenous Stopped 10/03/22 1746)  dextrose 50 % solution 50 mL (50 mLs Intravenous Given 10/03/22 1738)  lactated ringers bolus 500 mL (0 mLs Intravenous Stopped 10/03/22 1902)                                                                                                                                     Procedures .Critical Care  Performed by: GJeanell Sparrow DO Authorized by: GJeanell Sparrow DO   Critical care provider statement:    Critical care time (minutes):  100   Critical care time was exclusive of:  Separately billable procedures and treating other patients  Critical care was necessary to treat or prevent imminent or life-threatening deterioration of the following conditions:  Shock, sepsis, circulatory failure and respiratory  failure   Critical care was time spent personally by me on the following activities:  Development of treatment plan with patient or surrogate, discussions with consultants, evaluation of patient's response to treatment, examination of patient, ordering and review of laboratory studies, ordering and review of radiographic studies, ordering and performing treatments and interventions, pulse oximetry, re-evaluation of patient's condition, review of old charts and obtaining history from patient or surrogate   Care discussed with: admitting provider   .Paracentesis  Date/Time: 10/03/2022 4:36 PM  Performed by: Jeanell Sparrow, DO Authorized by: Jeanell Sparrow, DO   Consent:    Consent obtained:  Verbal   Consent given by:  Patient and spouse   Risks, benefits, and alternatives were discussed: yes     Risks discussed:  Bleeding, bowel perforation, infection and pain   Alternatives discussed:  No treatment, delayed treatment and alternative treatment Universal protocol:    Procedure explained and questions answered to patient or proxy's satisfaction: yes     Immediately prior to procedure, a time out was called: yes     Patient identity confirmed:  Verbally with patient and arm band Pre-procedure details:    Procedure purpose:  Diagnostic   Preparation: Patient was prepped and draped in usual sterile fashion   Anesthesia:    Anesthesia method:  Local infiltration   Local anesthetic:  Lidocaine 1% w/o epi Procedure details:    Needle gauge:  20   Ultrasound guidance: yes     Puncture site:  L lower quadrant   Fluid removed amount:  50   Fluid appearance:  Amber and serosanguinous   Dressing:  Adhesive bandage Post-procedure details:    Procedure completion:  Tolerated well, no immediate complications   (including critical care time)  Medical Decision Making / ED Course   MDM:  Benjamin [name] is a 67 y.o. male with past medical history as below, significant for ESRD on HD Monday  Wednesday Friday, CHF, DM, systolic heart failure, dyspnea, no home oxygen use who presents to the ED with complaint of AMS, hypoglycemia, hypertension, hypoxia.  . The complaint involves an extensive differential diagnosis and also carries with it a high risk of complications and morbidity.  Serious etiology was considered. Ddx includes but is not limited to: In my evaluation of this patient's dyspnea my DDx includes, but is not limited to, pneumonia, pulmonary embolism, pneumothorax, pulmonary edema, metabolic acidosis, asthma, COPD, cardiac cause, anemia, anxiety, etc.  Differential diagnosis includes but is not exclusive to acute cholecystitis, intrathoracic causes for epigastric abdominal pain, gastritis, duodenitis, pancreatitis, small bowel or large bowel obstruction, abdominal aortic aneurysm, hernia, gastritis, etc.   On initial assessment the patient is: Hypotensive, hypoxic, he has icteric sclera.  Abdomen is distended, distended abdominal wall veins noted on exam.  He is requiring supplemental oxygen 5 L, blood pressure is low.  He has reduced ejection fraction, give fluid bolus and start albumin given history of liver disease.  Vital signs and nursing notes were reviewed  Clinical Course as of 10/03/22 2033  Nancy Fetter Oct 03, 2022  1448 Patient hypotensive and hypoglycemic on arrival, POC glucose was 42.  Patient was given oral glucagon by EMS.  Will give amp of D50 here.  Blood pressure is low.  He does appear volume overloaded.  Will give half liter bolus of normal saline and start albumin. [  SG]  1624 Bedside paracentesis completed, fluid sent to lab.   [SG]  6301 Pt reports his breathing is improving, his BP remains low but improving. Rpt POC glucose improved.   [SG]  6010 Blood pressure remains low, will give further IV fluids.  He is severely depressed ejection fraction and on dialysis.  Will continue IV fluids, start peripheral levo.  Continue albumin. [SG]  1939 Spoke with Dr. Melvia Heaps of  nephrology will come to evaluate [SG]  1942 ?aspiration on CTAP, continue abx [SG]  1956 Hypoglycemic on arrival, given amp of D50.  Was repeated for 2 amp total. DM2 [SG]    Clinical Course User Index [SG] Jeanell Sparrow, DO   Patient hypothermic, started on Bair hugger.  Respiratory failure.  Lactic acidosis 3.4.  WBC 13.5.  Meets criteria for sepsis, unclear source.  Sepsis bundle ordered.  Start broad-spectrum antibiotics obtain blood cultures. Also complete paracentesis and send for culture.   Patient reassessed, blood pressure remains persistently depressed.  Continue peripheral pressors. Unclear source of infection at this time, continue broad-spectrum antibiotics after obtaining blood cultures Paracentesis was completed, no organisms on initial report His lactic acid is increasing, continue IV fluids.  Diagnostic paracentesis completed bedside, he will likely need therapeutic paracentesis during admission  Unclear source infection this time, recommend admission to ICU given need for vasopressor support.  Patient / family are agreeable.  Additional history obtained: -Additional history obtained from spouse -External records from outside source obtained and reviewed including: Chart review including previous notes, labs, imaging, consultation notes including prior ED visits, prior admission documentation, prior labs and imaging, home medications   Lab Tests: -I ordered, reviewed, and interpreted labs.   The pertinent results include:   Labs Reviewed  CBC WITH DIFFERENTIAL/PLATELET - Abnormal; Notable for the following components:      Result Value   WBC 13.5 (*)    RBC 4.07 (*)    Hemoglobin 12.1 (*)    HCT 38.6 (*)    RDW 17.8 (*)    Platelets 66 (*)    Neutro Abs 12.0 (*)    Lymphs Abs 0.6 (*)    Abs Immature Granulocytes 0.09 (*)    All other components within normal limits  BRAIN NATRIURETIC PEPTIDE - Abnormal; Notable for the following components:   B Natriuretic  Peptide >4,500.0 (*)    All other components within normal limits  PROTIME-INR - Abnormal; Notable for the following components:   Prothrombin Time 19.3 (*)    INR 1.7 (*)    All other components within normal limits  HEPATIC FUNCTION PANEL - Abnormal; Notable for the following components:   Total Protein 6.3 (*)    Albumin 2.4 (*)    Total Bilirubin 2.1 (*)    Bilirubin, Direct 1.2 (*)    All other components within normal limits  BASIC METABOLIC PANEL - Abnormal; Notable for the following components:   Sodium 127 (*)    Potassium 3.4 (*)    Chloride 94 (*)    CO2 20 (*)    Glucose, Bld 113 (*)    Creatinine, Ser 4.77 (*)    Calcium 8.2 (*)    GFR, Estimated 13 (*)    All other components within normal limits  LACTIC ACID, PLASMA - Abnormal; Notable for the following components:   Lactic Acid, Venous 3.4 (*)    All other components within normal limits  LACTIC ACID, PLASMA - Abnormal; Notable for the following components:   Lactic Acid, Venous  3.6 (*)    All other components within normal limits  APTT - Abnormal; Notable for the following components:   aPTT 48 (*)    All other components within normal limits  LACTATE DEHYDROGENASE, PLEURAL OR PERITONEAL FLUID - Abnormal; Notable for the following components:   LD, Fluid 65 (*)    All other components within normal limits  BODY FLUID CELL COUNT WITH DIFFERENTIAL - Abnormal; Notable for the following components:   Color, Fluid ORANGE (*)    Appearance, Fluid HAZY (*)    All other components within normal limits  CBG MONITORING, ED - Abnormal; Notable for the following components:   Glucose-Capillary 42 (*)    All other components within normal limits  CBG MONITORING, ED - Abnormal; Notable for the following components:   Glucose-Capillary 108 (*)    All other components within normal limits  CBG MONITORING, ED - Abnormal; Notable for the following components:   Glucose-Capillary 69 (*)    All other components within  normal limits  I-STAT VENOUS BLOOD GAS, ED - Abnormal; Notable for the following components:   pH, Ven 7.247 (*)    Acid-base deficit 5.0 (*)    Sodium 132 (*)    Calcium, Ion 1.08 (*)    HCT 38.0 (*)    Hemoglobin 12.9 (*)    All other components within normal limits  TROPONIN I (HIGH SENSITIVITY) - Abnormal; Notable for the following components:   Troponin I (High Sensitivity) 59 (*)    All other components within normal limits  TROPONIN I (HIGH SENSITIVITY) - Abnormal; Notable for the following components:   Troponin I (High Sensitivity) 62 (*)    All other components within normal limits  RESP PANEL BY RT-PCR (RSV, FLU A&B, COVID)  RVPGX2  BODY FLUID CULTURE W GRAM STAIN  CULTURE, BLOOD (ROUTINE X 2)  CULTURE, BLOOD (ROUTINE X 2)  LIPASE, BLOOD  AMMONIA  ETHANOL  GLUCOSE, PLEURAL OR PERITONEAL FLUID  PROTEIN, PLEURAL OR PERITONEAL FLUID  ALBUMIN, PLEURAL OR PERITONEAL FLUID   URINALYSIS, ROUTINE W REFLEX MICROSCOPIC  OSMOLALITY  LACTIC ACID, PLASMA  PATHOLOGIST SMEAR REVIEW  CBG MONITORING, ED    Notable for as above  EKG   EKG Interpretation  Date/Time:  Sunday October 03 2022 14:40:52 EST Ventricular Rate:  73 PR Interval:    QRS Duration: 153 QT Interval:  446 QTC Calculation: 492 R Axis:   -24 Text Interpretation: Atrial fibrillation Ventricular premature complex IVCD, consider atypical RBBB Probable inferior infarct, old Anterior infarct, age indeterminate Interpretation limited secondary to artifact Confirmed by Wynona Dove ([id number]) on 10/03/2022 2:50:38 PM         Imaging Studies ordered: I ordered imaging studies including chest x-ray, CT chest abdomen pelvis I independently visualized the following imaging with scope of interpretation limited to determining acute life threatening conditions related to emergency care: Large volume ascites, cholelithiasis, possible aspiration I independently visualized and interpreted imaging. I agree with the radiologist  interpretation  Bedside Ultrasound for guidance for paracentesis completed.  Shows large volume ascites   Medicines ordered and prescription drug management: Meds ordered this encounter  Medications   dextrose 50 % solution    Luszcak, Brianna L: cabinet override   sodium chloride 0.9 % bolus 500 mL   albumin human 25 % solution 50 g   ceFEPIme (MAXIPIME) 2 g in sodium chloride 0.9 % 100 mL IVPB    Order Specific Question:   Antibiotic Indication:    Answer:   Other  Indication (list below)    Order Specific Question:   Other Indication:    Answer:   Unknown source   metroNIDAZOLE (FLAGYL) IVPB 500 mg    Order Specific Question:   Antibiotic Indication:    Answer:   Other Indication (list below)    Order Specific Question:   Other Indication:    Answer:   Unknown source   DISCONTD: vancomycin (VANCOCIN) IVPB 1000 mg/200 mL premix    Order Specific Question:   Indication:    Answer:   Other Indication (list below)    Order Specific Question:   Other Indication:    Answer:   Unknown source   vancomycin (VANCOREADY) IVPB 1750 mg/350 mL    Order Specific Question:   Indication:    Answer:   Sepsis   dextrose 50 % solution 50 mL   ceFEPIme (MAXIPIME) 1 g in sodium chloride 0.9 % 100 mL IVPB    Order Specific Question:   Antibiotic Indication:    Answer:   Sepsis   vancomycin (VANCOCIN) IVPB 1000 mg/200 mL premix    Order Specific Question:   Indication:    Answer:   Sepsis   lactated ringers bolus 500 mL   0.9 %  sodium chloride infusion   norepinephrine (LEVOPHED) '4mg'$  in 216m (0.016 mg/mL) premix infusion    Order Specific Question:   IV Access    Answer:   Peripheral   dextrose 50 % solution 50 mL   lactated ringers bolus 500 mL    -I have reviewed the patients home medicines and have made adjustments as needed   Consultations Obtained: I requested consultation with the Dr. SJonnie Finnernephrology, Dr. GPatsey Berthold  and discussed lab and imaging findings as well as pertinent  plan - they recommend: admission   Cardiac Monitoring: The patient was maintained on a cardiac monitor.  I personally viewed and interpreted the cardiac monitored which showed an underlying rhythm of: NSR  Social Determinants of Health:  Diagnosis or treatment significantly limited by social determinants of health: former smoker and alcohol use   Reevaluation: After the interventions noted above, I reevaluated the patient and found that they have improved  Co morbidities that complicate the patient evaluation  Past Medical History:  Diagnosis Date   AKI (acute kidney injury) ([facility] 02/2020   stage 4   CHF (congestive heart failure) ([facility]    Diabetes mellitus without complication ([facility]    diet controlled   Dyspnea    Hypertension       Dispostion: Disposition decision including need for hospitalization was considered, and patient admitted to the hospital.    Final Clinical Impression(s) / ED Diagnoses Final diagnoses:  Alcoholic cirrhosis of liver with ascites ([facility]  Acute respiratory failure with hypoxia ([facility]  Shock ([facility]  [place] dialysis ([facility]  Thrombocytopenia ([facility]     This chart was dictated using voice recognition software.  Despite best efforts to proofread,  errors can occur which can change the documentation meaning.    GJeanell Sparrow DO 10/03/22 2034

## 2022-10-03 NOTE — Progress Notes (Signed)
Cbg 55. Notified Elink . See new orders . Will recheck. Denies pain dizziness shortness of breath. Alert to person and place but states the year is 2023 and does not know the current month. Bp 105/61. Levo @ 8 mcg/min infusing in US guided PIV w/ IV watch placed to monitor. Continuing to monitor and complete full assessment.

## 2022-10-03 NOTE — ED Triage Notes (Signed)
Pt brought in by EMS from home after being called for AMS, slurred speech. Upon arrival BS was 50. Given '15mg'$  oral glucose and juice. Pt was 88% on RA, placed on 8L [place]. Pt is a MWF dialysis patient (only did half a treatment on Friday). Upon arrival to ED pt does not have any slurred speech, reports feeling SOB/cough, abdominal distention. Audible wheezing

## 2022-10-04 ENCOUNTER — Inpatient Hospital Stay (HOSPITAL_COMMUNITY): Payer: Medicare Other

## 2022-10-04 DIAGNOSIS — I5021 Acute systolic (congestive) heart failure: Secondary | ICD-10-CM

## 2022-10-04 LAB — CBC
HCT: 38.6 % — ABNORMAL LOW (ref 39.0–52.0)
Hemoglobin: 11.7 g/dL — ABNORMAL LOW (ref 13.0–17.0)
MCH: 29.5 pg (ref 26.0–34.0)
MCHC: 30.3 g/dL (ref 30.0–36.0)
MCV: 97.5 fL (ref 80.0–100.0)
Platelets: 75 10*3/uL — ABNORMAL LOW (ref 150–400)
RBC: 3.96 MIL/uL — ABNORMAL LOW (ref 4.22–5.81)
RDW: 17.9 % — ABNORMAL HIGH (ref 11.5–15.5)
WBC: 19.3 10*3/uL — ABNORMAL HIGH (ref 4.0–10.5)
nRBC: 0 % (ref 0.0–0.2)

## 2022-10-04 LAB — RESPIRATORY PANEL BY PCR

## 2022-10-04 LAB — CORTISOL: Cortisol, Plasma: 14.2 ug/dL

## 2022-10-04 LAB — PROCALCITONIN: Procalcitonin: 3.28 ng/mL

## 2022-10-04 LAB — BASIC METABOLIC PANEL
Anion gap: 16 — ABNORMAL HIGH (ref 5–15)
BUN: 17 mg/dL (ref 8–23)
CO2: 22 mmol/L (ref 22–32)
Calcium: 8.2 mg/dL — ABNORMAL LOW (ref 8.9–10.3)
Chloride: 92 mmol/L — ABNORMAL LOW (ref 98–111)
Creatinine, Ser: 4.76 mg/dL — ABNORMAL HIGH (ref 0.61–1.24)
GFR, Estimated: 13 mL/min — ABNORMAL LOW (ref >60)
Glucose, Bld: 70 mg/dL (ref 70–99)
Potassium: 3.8 mmol/L (ref 3.5–5.1)
Sodium: 130 mmol/L — ABNORMAL LOW (ref 135–145)

## 2022-10-04 LAB — GLUCOSE, CAPILLARY
Glucose-Capillary: 110 mg/dL — ABNORMAL HIGH (ref 70–99)
Glucose-Capillary: 71 mg/dL (ref 70–99)
Glucose-Capillary: 72 mg/dL (ref 70–99)
Glucose-Capillary: 75 mg/dL (ref 70–99)
Glucose-Capillary: 77 mg/dL (ref 70–99)
Glucose-Capillary: 78 mg/dL (ref 70–99)
Glucose-Capillary: 83 mg/dL (ref 70–99)
Glucose-Capillary: 87 mg/dL (ref 70–99)

## 2022-10-04 LAB — ECHOCARDIOGRAM COMPLETE
AR max vel: 1.54 cm2
AV Area VTI: 1.56 cm2
AV Area mean vel: 1.52 cm2
AV Mean grad: 26 mmHg
AV Peak grad: 37.9 mmHg
AV Vena cont: 0.6 cm
Ao pk vel: 3.08 m/s
Area-P 1/2: 3.43 cm2
Est EF: 40
Height: 72 in
P 1/2 time: 1354 msec
S' Lateral: 3.5 cm
Weight: 2960 oz

## 2022-10-04 LAB — COOXEMETRY PANEL
Carboxyhemoglobin: 2 % — ABNORMAL HIGH (ref 0.5–1.5)
Methemoglobin: <0.7 % (ref 0.0–1.5)
O2 Saturation: 92.1 %
Total hemoglobin: 10.8 g/dL — ABNORMAL LOW (ref 12.0–16.0)

## 2022-10-04 LAB — PHOSPHORUS
Phosphorus: 4.7 mg/dL — ABNORMAL HIGH (ref 2.5–4.6)
Phosphorus: 4.8 mg/dL — ABNORMAL HIGH (ref 2.5–4.6)

## 2022-10-04 LAB — HEMOGLOBIN A1C
Hgb A1c MFr Bld: 4.6 % — ABNORMAL LOW (ref 4.8–5.6)
Mean Plasma Glucose: 85.32 mg/dL

## 2022-10-04 LAB — MRSA NEXT GEN BY PCR, NASAL: MRSA by PCR Next Gen: DETECTED — AB

## 2022-10-04 LAB — LACTIC ACID, PLASMA: Lactic Acid, Venous: 2 mmol/L (ref 0.5–1.9)

## 2022-10-04 LAB — HIV ANTIBODY (ROUTINE TESTING W REFLEX): HIV Screen 4th Generation wRfx: NONREACTIVE

## 2022-10-04 LAB — MAGNESIUM: Magnesium: 2.1 mg/dL (ref 1.7–2.4)

## 2022-10-04 LAB — HEPATITIS B SURFACE ANTIGEN: Hepatitis B Surface Ag: NONREACTIVE

## 2022-10-04 MED ORDER — "THROMBI-PAD 3""X3"" EX PADS"
1.0000 | MEDICATED_PAD | Freq: Once | CUTANEOUS | Status: AC
  Administered 2022-10-04: 1 via TOPICAL
  Filled 2022-10-04: qty 1

## 2022-10-04 MED ORDER — HEPARIN SODIUM (PORCINE) 1000 UNIT/ML IJ SOLN
INTRAMUSCULAR | Status: AC
  Administered 2022-10-04: 1900 [IU]
  Filled 2022-10-04: qty 2

## 2022-10-04 MED ORDER — ORAL CARE MOUTH RINSE
15.0000 mL | OROMUCOSAL | Status: DC
  Administered 2022-10-04 – 2022-10-12 (×25): 15 mL via OROMUCOSAL

## 2022-10-04 MED ORDER — ARFORMOTEROL TARTRATE 15 MCG/2ML IN NEBU
15.0000 ug | INHALATION_SOLUTION | Freq: Two times a day (BID) | RESPIRATORY_TRACT | Status: DC
  Administered 2022-10-04 – 2022-10-12 (×16): 15 ug via RESPIRATORY_TRACT
  Filled 2022-10-04 (×19): qty 2

## 2022-10-04 MED ORDER — MAGNESIUM SULFATE 2 GM/50ML IV SOLN
2.0000 g | Freq: Once | INTRAVENOUS | Status: AC
  Administered 2022-10-04: 2 g via INTRAVENOUS
  Filled 2022-10-04: qty 50

## 2022-10-04 MED ORDER — PHENTOLAMINE MESYLATE 5 MG IJ SOLR
5.0000 mg | Freq: Once | INTRAMUSCULAR | Status: AC
  Administered 2022-10-04: 5 mg via SUBCUTANEOUS
  Filled 2022-10-04: qty 5

## 2022-10-04 MED ORDER — ORAL CARE MOUTH RINSE: 15.0000 mL | OROMUCOSAL | Status: DC | PRN

## 2022-10-04 MED ORDER — MUPIROCIN 2 % EX OINT
1.0000 | TOPICAL_OINTMENT | Freq: Two times a day (BID) | CUTANEOUS | Status: AC
  Administered 2022-10-04 – 2022-10-08 (×10): 1 via NASAL
  Filled 2022-10-04: qty 22

## 2022-10-04 MED ORDER — HEPARIN SODIUM (PORCINE) 1000 UNIT/ML IJ SOLN: 1900.0000 [IU] | Freq: Once | INTRAMUSCULAR | Status: AC

## 2022-10-04 MED ORDER — HEPARIN SODIUM (PORCINE) 1000 UNIT/ML IJ SOLN
3800.0000 [IU] | Freq: Once | INTRAMUSCULAR | Status: AC
  Administered 2022-10-04: 3800 [IU] via INTRAVENOUS

## 2022-10-04 MED ORDER — NITROGLYCERIN 2 % TD OINT
1.0000 [in_us] | TOPICAL_OINTMENT | Freq: Three times a day (TID) | TRANSDERMAL | Status: AC
  Administered 2022-10-04 – 2022-10-05 (×3): 1 [in_us] via TOPICAL
  Filled 2022-10-04: qty 30

## 2022-10-04 NOTE — Progress Notes (Signed)
Flutter placed at bedside, pt is asleep will attempt to inform pt how to use flutter later in the day

## 2022-10-04 NOTE — Progress Notes (Signed)
[facility] NEPHROLOGY PROGRESS NOTE  Assessment/ Plan: OP HD orders:  [place] MWF 4h  400/600  86.9kg  3K/2.5Ca bath  TDC  RIJ + LUA AVF  Heparin none - last HD 1/26, post wt 88.3kg   - soft BP's at HD in 90s for last 2 wks at minimum - venofer '50mg'$  weekly - mircera 30 mcg q4 wks, last on 1/15, due 2/12    # Septic shock from pneumonia/RML: Currently on ceftriaxone, azithromycin, follow-up culture.  Requiring around 8 mics of Levophed.  Currently in ICU.  # ESRD MWF: Plan for regular dialysis today.  May need to adjust Levophed dose during HD.  Volume looks acceptable.  Currently has tunneled HD catheter for the access.  # Acute hypoxic respiratory failure due to pneumonia: On oxygen, antibiotics.  Per pulmonary team.  # Anemia of CKD: Hemoglobin at goal.  [facility]inf[facility] Rast as outpatient.  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  Currently NPO.  # Hyponatremia: Managed with HD.  UF as tolerated.  Subjective: Seen and examined in ICU.  Patient was alert awake.  Around 8 mics of Levophed with acceptable BP.  Currently on oxygen around 5 6 L.  Discussed with ICU team. Objective Vital signs in last 24 hours: Vitals:   10/04/22 0600 10/04/22 0700 10/04/22 0753 10/04/22 0807  BP: 105/63 98/61    Pulse: 72 72    Resp: 16 20    Temp:   97.8 F (36.6 C)   TempSrc:   Oral   SpO2: 98% 92%  93%  Weight:      Height:       Weight change:   Intake/Output Summary (Last 24 hours) at 10/04/2022 0918 Last data filed at 10/04/2022 0600 Gross per 24 hour  Intake 3193.08 ml  Output --  Net 3193.08 ml       Labs: RENAL PANEL Recent Labs    03/31/22 0710 10/03/22 1450 10/03/22 2000 10/03/22 2302 10/04/22 0712  NA 126* 127* 132*  --  130*  K 3.9 3.4* 3.7  --  3.8  CL 92* 94*  --   --  92*  CO2  --  20*  --   --  22  GLUCOSE 77 113*  --   --  70  BUN 31* 17  --   --  17  CREATININE 4.20* 4.77*  --  3.87* 4.76*  CALCIUM  --  8.2*  --   --  8.2*  MG   --   --   --  1.4* 2.1  PHOS  --   --   --   --  4.7*  ALBUMIN  --  2.4*  --   --   --      Liver Function Tests: Recent Labs  Lab 10/03/22 1450  AST 34  ALT 14  ALKPHOS 88  BILITOT 2.1*  PROT 6.3*  ALBUMIN 2.4*   Recent Labs  Lab 10/03/22 1447  LIPASE 26   Recent Labs  Lab 10/03/22 1449  AMMONIA 34   CBC: Recent Labs    03/31/22 0710 10/03/22 1447 10/03/22 1447 10/03/22 2000 10/03/22 2302 10/04/22 0712  HGB 12.2* 12.1*  --  12.9* 9.5* 11.7*  MCV  --  94.8   < >  --  94.6 97.5   < > = values in this interval not displayed.    Cardiac Enzymes: No results for input(s): "CKTOTAL", "CKMB", "CKMBINDEX", "TROPONINI" in the last 168 hours. CBG: Recent Labs  Lab  10/03/22 2301 10/04/22 0010 10/04/22 0227 10/04/22 0434 10/04/22 0751  GLUCAP 88 71 87 110* 78    Iron Studies: No results for input(s): "IRON", "TIBC", "TRANSFERRIN", "FERRITIN" in the last 72 hours. Studies/Results: CT CHEST ABDOMEN PELVIS WO CONTRAST  Result Date: 10/03/2022 CLINICAL DATA:  Hypoglycemia, altered mental status, slurred speech EXAM: CT CHEST, ABDOMEN AND PELVIS WITHOUT CONTRAST TECHNIQUE: Multidetector CT imaging of the chest, abdomen and pelvis was performed following the standard protocol without IV contrast. RADIATION DOSE REDUCTION: This exam was performed according to the departmental dose-optimization program which includes automated exposure control, adjustment of the mA and/or kV according to patient size and/or use of iterative reconstruction technique. COMPARISON:  CTA chest dated 09/24/2022. CT abdomen/pelvis dated 04/17/2021. FINDINGS: CT CHEST FINDINGS Cardiovascular: Mild cardiomegaly.  No pericardial effusion. No evidence of thoracic aortic aneurysm. Atherosclerotic calcifications of the aortic root and arch. Severe three-vessel coronary atherosclerosis. Right IJ dialysis catheter terminates in the lower right atrium. Mediastinum/Nodes: No suspicious mediastinal lymphadenopathy.  Visualized thyroid is unremarkable. Lungs/Pleura: Evaluation lung parenchyma is constrained by respiratory motion. Within that constraint, there are no suspicious pulmonary nodules. Mild dependent patchy opacities in the lungs bilaterally, posterior right middle lobe (series 5/image 88) and left lower lobe predominant (series 5/image 111). This appearance favors mild aspiration over atelectasis. No pleural effusion or pneumothorax. Musculoskeletal: Mild degenerative changes of the lower thoracic spine. Old healing and healed right rib fractures. CT ABDOMEN PELVIS FINDINGS Hepatobiliary: Cirrhotic configuration of the liver. Cholelithiasis, without associated inflammatory changes. No intrahepatic or extrahepatic duct dilatation. Pancreas: Within normal limits. Spleen: Within normal limits. Adrenals/Urinary Tract: Adrenal glands are within normal limits. Kidneys are within normal limits. No renal calculi or hydronephrosis. Bladder is decompressed. Stomach/Bowel: Stomach is within normal limits. No evidence of bowel obstruction. Appendix is not discretely visualized. No colonic wall thickening or inflammatory changes. Vascular/Lymphatic: No evidence of abdominal aortic aneurysm. Atherosclerotic calcifications of the abdominal aorta and branch vessels. No suspicious abdominopelvic lymphadenopathy. Reproductive: Prostate is unremarkable. Other: Moderate abdominopelvic ascites, new from 2022 but unchanged in the upper abdomen from recent CT chest. Musculoskeletal: Degenerative changes of the lumbar spine. Old right stone superior and inferior pubic rami fracture deformities. IMPRESSION: Mild dependent patchy opacities in the lungs bilaterally, favoring mild aspiration over atelectasis. Cirrhosis. Moderate abdominopelvic ascites. Cholelithiasis, without associated inflammatory changes. Additional ancillary findings as above. Electronically Signed   By: Julian Hy M.D.   On: 10/03/2022 19:07   DG Chest Portable 1  View  Result Date: 10/03/2022 CLINICAL DATA:  Altered mental status. EXAM: PORTABLE CHEST 1 VIEW COMPARISON:  September 24, 2022. FINDINGS: Stable cardiomegaly. Right internal jugular dialysis catheter is unchanged. Both lungs are clear. The visualized skeletal structures are unremarkable. IMPRESSION: No active disease. Electronically Signed   By: Marijo Conception M.D.   On: 10/03/2022 15:47    Medications: Infusions:  sodium chloride Stopped (10/03/22 1919)   sodium chloride 250 mL (10/03/22 2255)   azithromycin 500 mg (10/04/22 0003)   cefTRIAXone (ROCEPHIN)  IV 2 g (10/03/22 2312)   dextrose 25 mL/hr at 10/04/22 0031   norepinephrine (LEVOPHED) Adult infusion 10 mcg/min (10/04/22 0903)    Scheduled Medications:  arformoterol  15 mcg Nebulization BID   budesonide (PULMICORT) nebulizer solution  0.5 mg Nebulization BID   Chlorhexidine Gluconate Cloth  6 each Topical [id number]   folic acid  1 mg Oral Daily   heparin  5,000 Units Subcutaneous Q8H   midodrine  10 mg Oral Q8H   multivitamin with  minerals  1 tablet Oral Daily   mupirocin ointment  1 Application Nasal BID   nitroGLYCERIN  1 inch Topical Q8H   phentolamine  5 mg Subcutaneous Once   thiamine (VITAMIN B1) injection  100 mg Intravenous Daily    have reviewed scheduled and prn medications.  Physical Exam: General:NAD, comfortable Heart:RRR, s1s2 nl Lungs: Basal rhonchi, no increased work of breathing Abdomen:soft, Non-tender, non-distended Extremities: Trace peripheral  edema Dialysis Access: RIJ TDC in place.  Nataly Pacifico Prasad Duel Conrad 10/04/2022,9:18 AM  LOS: 1 day

## 2022-10-04 NOTE — Progress Notes (Signed)
   NAME:  KA FLAMMER, MRN:  [id number], DOB:  09-24-1955, LOS: 1 ADMISSION DATE:  10/03/2022, CONSULTATION DATE:  1/28 REFERRING MD:  Pearline Cables, CHIEF COMPLAINT:  Septic shock   History of Present Illness:  67 y/o male with ESRD and cirrhosis here with RML CAP causing septic shock.  Pertinent  Medical History  ESRD with HD MWF Cirrhosis due to cirrhosis HFrEF DM2 EtOH abuse   Significant Hospital Events: Including procedures, antibiotic start and stop dates in addition to other pertinent events   1/28 septic shock from CAP, on levophed  Interim History / Subjective:  Infiltration of levophed in R arm  Objective   Blood pressure 98/61, pulse 72, temperature 97.8 F (36.6 C), temperature source Oral, resp. rate 20, height 6' (1.829 m), weight 83.9 kg, SpO2 93 %.        Intake/Output Summary (Last 24 hours) at 10/04/2022 0841 Last data filed at 10/04/2022 0600 Gross per 24 hour  Intake 3193.08 ml  Output --  Net 3193.08 ml   Filed Weights   10/03/22 1451  Weight: 83.9 kg    Examination:  General:  In bed on vent HENT: NCAT ETT in place PULM: CTA B, vent supported breathing CV: RRR, no mgr GI: BS+, soft, nontender MSK: normal bulk and tone Neuro: sedated on vent  Resolved Hospital Problem list     Assessment & Plan:  Septic shock from CAP in RML, organism unspecified Continue ceftriaxone/azithro Follow blood cultures  Acute respiratory failure with hypoxemia from pneumonia Asthma Pulmicort/brovana Wean off O2 for O2 saturation > 92% NPO  ESRD HD per renal, MWF  Monitor BMET  Alcoholic cirrhosis with ascites Does not have SBP based on peritoneal fluid analysis Albumin given on 1/28, give again on 1/30  Hypoglycemia DM2 SSI Continue D10  HFrEF echo 20% Hypertension Hold antihypertensives Volume removal per renal  Anemia of chronic disease Thrombocytopenia Monitor for bleeding Transfuse PRBC for Hgb < 7 gm/dL  Concern for norepinephrine  extravasation arm 1/29 Stop levophed through that IV Place CVL, verbal consent from son Lovena Le 1/29 AM Levophed extravasation protocol   Best Practice (right click and "Reselect all SmartList Selections" daily)   Diet/type: NPO DVT prophylaxis: prophylactic heparin  GI prophylaxis: N/A Lines: Dialysis Catheter and yes and it is still needed Foley:  removal ordered  Code Status:  full code Last date of multidisciplinary goals of care discussion [1/29 0900 son Lovena Le: full code]  Critical care time: 35 minutes    Roselie Awkward, MD [place] [facility] Pager: [phone] Cell: [phone] After 7:00 pm call Elink  [phone]

## 2022-10-04 NOTE — Procedures (Signed)
Central Venous Catheter Insertion Procedure Note  CUTTER PASSEY  [id number]  Feb 06, 1956  Date:10/04/22  Time:10:52 AM   Provider Performing:Brent Jamiracle Avants   Procedure: Insertion of Non-tunneled Central Venous [phone]) with US guidance ([id number])   Indication(s) Medication administration  Consent Risks of the procedure as well as the alternatives and risks of each were explained to the patient and/or caregiver.  Consent for the procedure was obtained and is signed in the bedside chart  Anesthesia Topical only with 1% lidocaine   Timeout Verified patient identification, verified procedure, site/side was marked, verified correct patient position, special equipment/implants available, medications/allergies/relevant history reviewed, required imaging and test results available.  Sterile Technique Maximal sterile technique including full sterile barrier drape, hand hygiene, sterile gown, sterile gloves, mask, hair covering, sterile ultrasound probe cover (if used).  Procedure Description Area of catheter insertion was cleaned with chlorhexidine and draped in sterile fashion.  With real-time ultrasound guidance a central venous catheter was placed into the left internal jugular vein. Nonpulsatile blood flow and easy flushing noted in all ports.  The catheter was sutured in place and sterile dressing applied.  Complications/Tolerance None; patient tolerated the procedure well. Chest X-ray is ordered to verify placement for internal jugular or subclavian cannulation.   Chest x-ray is not ordered for femoral cannulation.  EBL Minimal  Specimen(s) None  Roselie Awkward, MD [place] P[facility]M Pager: [phone] Cell: [phone] After 7:00 pm call Elink  [phone]

## 2022-10-04 NOTE — Consult Note (Signed)
WOC Nurse Consult Note:  Reason for Consult: skin tears and concern for Levophed extravasation (Levophed extravasation protocol in place) Wound type: Full thickness x 2, partial thickness x 1  Measurement:Full thickness R upper anterior forearm 2 cms x 1.5 cms x 0.1 cms 100% red and moist, full thickness R lateral forearm 6 cms x 1.5 cms x 0.1 cms 100% red and moist, Partial thickness lower R anterior forearm 1 cms x 2 cms 100% red and moist   Drainage (amount, consistency, odor) moderate sanguinous from R upper anterior forearm, otherwise minimal serosanguinous  Periwound: erythematous, areas of ecchymosis scattered  Dressing procedure/placement/frequency: Clean with NS, apply Xeroform gauze Kellie Simmering [phone]) to all open areas daily and wrap with kerlix.    WOC will not follow at this time.  Re-consult if further needs arise.   Thank you,    Armanie Ullmer MSN, RN-BC, [employer]

## 2022-10-04 NOTE — Progress Notes (Signed)
[place] Progress Note Patient Name: Benjamin [name] DOB: 06/17/1956 MRN: [id number]   Date of Service  10/04/2022  HPI/Events of Note  Mag 1.4  eICU Interventions  Replete     Intervention Category Minor Interventions: Electrolytes abnormality - evaluation and management  Natalee Tomkiewicz Rodman Pickle 10/04/2022, 12:24 AM

## 2022-10-04 NOTE — Progress Notes (Signed)
OT Cancellation Note  Patient Details Name: Benjamin [name] MRN: [id number] DOB: [birth year]   Cancelled Treatment:    Reason Eval/Treat Not Completed: Patient at procedure or test/ unavailable Central line placed at bedside earlier this AM and echo now at bedside. Will follow up for OT eval as schedule permits.  Layla Maw 10/04/2022, 11:17 AM

## 2022-10-04 NOTE — Progress Notes (Signed)
PT Cancellation Note  Patient Details Name: Benjamin [name] MRN: [id number] DOB: Nov 01, 1955   Cancelled Treatment:    Reason Eval/Treat Not Completed: Patient at procedure or test/unavailable   Ronon Ferger B Ytzel Gubler 10/04/2022, 10:14 AM

## 2022-10-04 NOTE — Progress Notes (Signed)
   10/04/22 2341  Vitals  Temp 98.2 F (36.8 C)  Temp Source Oral  BP 109/62  MAP (mmHg) 77  BP Location Right Arm  BP Method Automatic  Patient Position (if appropriate) Lying  Pulse Rate 80  Pulse Rate Source Monitor  ECG Heart Rate 80  Resp 16  Oxygen Therapy  SpO2 97 %  O2 Device Nasal Cannula  O2 Flow Rate (L/min) 5 L/min   Performed Bedside treatment   Alert and oriented.  Informed consent obtained and signed in chart.   Treatment initiated: 2010 Treatment completed: 2340  Patient tolerated well.  Alert, without acute distress.  Hand-off given to patient's nurse.   Access used: R Cath Access issues: None  Total UF removed: 1036m Medication(s) given: Midodrine (scheduled)  Post HD VS: Attached above Post HD weight: 89.9kgs   [facility]

## 2022-10-04 NOTE — Progress Notes (Signed)
Pt receives out-pt HD at [facility] on MWF. Will assist as needed.   Malik Paar Renal Navigator [phone] 

## 2022-10-04 NOTE — Progress Notes (Signed)
At bedside to assess PIV sites in the right forearm. Bruising noted with some possible infiltration of 2 of the 3 PIVs. No suitable site found for another PIV to be placed. Left are is restricted. Staff RN made aware as well as MD.

## 2022-10-05 ENCOUNTER — Encounter (HOSPITAL_COMMUNITY): Payer: Self-pay

## 2022-10-05 LAB — BASIC METABOLIC PANEL
Anion gap: 10 (ref 5–15)
BUN: 10 mg/dL (ref 8–23)
CO2: 25 mmol/L (ref 22–32)
Calcium: 7.9 mg/dL — ABNORMAL LOW (ref 8.9–10.3)
Chloride: 93 mmol/L — ABNORMAL LOW (ref 98–111)
Creatinine, Ser: 3.19 mg/dL — ABNORMAL HIGH (ref 0.61–1.24)
GFR, Estimated: 21 mL/min — ABNORMAL LOW (ref >60)
Glucose, Bld: 85 mg/dL (ref 70–99)
Potassium: 3 mmol/L — ABNORMAL LOW (ref 3.5–5.1)
Sodium: 128 mmol/L — ABNORMAL LOW (ref 135–145)

## 2022-10-05 LAB — GLUCOSE, CAPILLARY
Glucose-Capillary: 125 mg/dL — ABNORMAL HIGH (ref 70–99)
Glucose-Capillary: 146 mg/dL — ABNORMAL HIGH (ref 70–99)
Glucose-Capillary: 74 mg/dL (ref 70–99)
Glucose-Capillary: 85 mg/dL (ref 70–99)
Glucose-Capillary: 96 mg/dL (ref 70–99)
Glucose-Capillary: 99 mg/dL (ref 70–99)

## 2022-10-05 LAB — CBC
HCT: 32 % — ABNORMAL LOW (ref 39.0–52.0)
Hemoglobin: 10.4 g/dL — ABNORMAL LOW (ref 13.0–17.0)
MCH: 29.6 pg (ref 26.0–34.0)
MCHC: 32.5 g/dL (ref 30.0–36.0)
MCV: 91.2 fL (ref 80.0–100.0)
Platelets: 82 10*3/uL — ABNORMAL LOW (ref 150–400)
RBC: 3.51 MIL/uL — ABNORMAL LOW (ref 4.22–5.81)
RDW: 17.3 % — ABNORMAL HIGH (ref 11.5–15.5)
WBC: 11.4 10*3/uL — ABNORMAL HIGH (ref 4.0–10.5)
nRBC: 0 % (ref 0.0–0.2)

## 2022-10-05 LAB — MAGNESIUM: Magnesium: 1.8 mg/dL (ref 1.7–2.4)

## 2022-10-05 LAB — HEPATITIS B SURFACE ANTIBODY, QUANTITATIVE: Hep B S AB Quant (Post): 834.1 m[IU]/mL (ref >9.9)

## 2022-10-05 MED ORDER — AZITHROMYCIN 500 MG PO TABS
500.0000 mg | ORAL_TABLET | Freq: Every day | ORAL | Status: AC
  Administered 2022-10-05 – 2022-10-08 (×4): 500 mg via ORAL
  Filled 2022-10-05 (×4): qty 1

## 2022-10-05 MED ORDER — CHLORHEXIDINE GLUCONATE CLOTH 2 % EX PADS
6.0000 | MEDICATED_PAD | Freq: Every day | CUTANEOUS | Status: DC
  Administered 2022-10-05 – 2022-10-10 (×3): 6 via TOPICAL

## 2022-10-05 MED ORDER — NOREPINEPHRINE 16 MG/250ML-% IV SOLN
2.0000 ug/min | INTRAVENOUS | Status: DC
  Administered 2022-10-05: 7 ug/min via INTRAVENOUS
  Filled 2022-10-05 (×2): qty 250

## 2022-10-05 MED ORDER — MAGNESIUM OXIDE -MG SUPPLEMENT 400 (240 MG) MG PO TABS
400.0000 mg | ORAL_TABLET | Freq: Once | ORAL | Status: AC
  Administered 2022-10-05: 400 mg via ORAL
  Filled 2022-10-05: qty 1

## 2022-10-05 MED ORDER — POTASSIUM CHLORIDE CRYS ER 10 MEQ PO TBCR
40.0000 meq | EXTENDED_RELEASE_TABLET | Freq: Once | ORAL | Status: AC
  Administered 2022-10-05: 40 meq via ORAL
  Filled 2022-10-05: qty 4

## 2022-10-05 MED ORDER — SODIUM CHLORIDE 0.9% FLUSH: 10.0000 mL | INTRAVENOUS | Status: DC | PRN

## 2022-10-05 MED ORDER — SODIUM CHLORIDE 0.9 % IV SOLN
2.0000 g | INTRAVENOUS | Status: AC
  Administered 2022-10-05 – 2022-10-07 (×3): 2 g via INTRAVENOUS
  Filled 2022-10-05 (×3): qty 20

## 2022-10-05 MED ORDER — THIAMINE MONONITRATE 100 MG PO TABS
100.0000 mg | ORAL_TABLET | Freq: Every day | ORAL | Status: DC
  Administered 2022-10-05 – 2022-10-12 (×7): 100 mg via ORAL
  Filled 2022-10-05 (×8): qty 1

## 2022-10-05 MED ORDER — POTASSIUM CHLORIDE 10 MEQ/50ML IV SOLN: 10.0000 meq | INTRAVENOUS | Status: DC

## 2022-10-05 MED ORDER — HYDROXYZINE HCL 25 MG PO TABS
50.0000 mg | ORAL_TABLET | Freq: Three times a day (TID) | ORAL | Status: DC | PRN
  Administered 2022-10-05: 50 mg via ORAL
  Filled 2022-10-05: qty 2

## 2022-10-05 MED ORDER — SODIUM CHLORIDE 0.9% FLUSH
10.0000 mL | Freq: Two times a day (BID) | INTRAVENOUS | Status: DC
  Administered 2022-10-05 – 2022-10-06 (×2): 30 mL
  Administered 2022-10-06: 20 mL
  Administered 2022-10-07 – 2022-10-12 (×10): 10 mL

## 2022-10-05 MED ORDER — ALBUMIN HUMAN 25 % IV SOLN
25.0000 g | Freq: Once | INTRAVENOUS | Status: AC
  Administered 2022-10-05: 25 g via INTRAVENOUS
  Filled 2022-10-05: qty 100

## 2022-10-05 NOTE — Progress Notes (Signed)
  Interdisciplinary Goals of Care Family Meeting   Date carried out:: 10/05/2022  Location of the meeting: Bedside  Member's involved: Physician and Family Member or next of kin  Durable Power of Attorney or acting medical decision maker: Son Benjamin [name]    Discussion: We discussed goals of care for Benjamin [name] .  We discussed the fact  Benjamin [name] has had improvement from his critical illness but remains critically ill and will likely be hospitalized for another 3 to 5 days.  We discussed the fact that in the setting of his severe multiple chronic illnesses including but not limited to cirrhosis, end-stage renal disease and advanced heart failure that it is very likely that he will be hospitalized and/or die within the next 6 to 12 months.  I discussed the fact that if he were to end up on life support he would be very difficult to be liberated from mechanical ventilation because of all of his medical problems.  Benjamin [name] says that he in clinic never had a detailed conversation regarding this.  At this time [place] remains full code but Benjamin [name] commits to try to have a conversation with him.  He does note that he thinks that it may be difficult because his dad is becoming progressively confused over the last few months and is no longer capable of normal communication.  Given this, I advised that a reasonable option may be to consider a limited trial of mechanical ventilation if necessary such as 48 hours in the event of a future illness.  Code status: Full Code  Disposition: Continue current acute care   Time spent for the meeting: 15 minutes  Roselie Awkward 10/05/2022, 3:45 PM

## 2022-10-05 NOTE — Evaluation (Signed)
Physical Therapy Evaluation Patient Details Name: Benjamin [name] MRN: [id number] DOB: 04/19/1956 Today's Date: 10/05/2022  History of Present Illness  67 yo male admitted 1/28 with AMS and hypoglycemia. Moderate ascites with diagnostic paracentesis in ED. 1/29 RUE infiltration. PMHx: T2DM, ESRD on HD MWF, HFrEF, cirrhosis  Clinical Impression  Pt pleasant, flat affect with some difficulty hearing at times. Pt lives at home with spouse and reports walking with Std[employer] at home and able to care for himself. Pt cares for horses but no longer rides them. Pt with decreased cognition, balance, transfers, strength and function who will benefit from acute therapy to maximize mobility, safety and independence to decrease burden of care.   Supine 140/82 Sitting 106/75 During gait 130/105 HR 76-79 SpO2 95% on 2L 96% 1L end of session       Recommendations for follow up therapy are one component of a multi-disciplinary discharge planning process, led by the attending physician.  Recommendations may be updated based on patient status, additional functional criteria and insurance authorization.  Follow Up Recommendations Home health PT      Assistance Recommended at Discharge Intermittent Supervision/Assistance  Patient can return home with the following  A little help with bathing/dressing/bathroom;A little help with walking and/or transfers;Assistance with cooking/housework;Direct supervision/assist for medications management;Assist for transportation    Equipment Recommendations Rolling walker (2 wheels)  Recommendations for Other Services  OT consult    Functional Status Assessment Patient has had a recent decline in their functional status and demonstrates the ability to make significant improvements in function in a reasonable and predictable amount of time.     Precautions / Restrictions Precautions Precautions: Fall;Other (comment) Precaution Comments: watch sats and BP       Mobility  Bed Mobility Overal bed mobility: Needs Assistance Bed Mobility: Supine to Sit     Supine to sit: HOB elevated, Min assist     General bed mobility comments: HOb 30 degrees with use of rail, cues for sequence, increased time    Transfers Overall transfer level: Needs assistance   Transfers: Sit to/from Stand Sit to Stand: Min assist           General transfer comment: min assist to rise from surface, cues for hand placement and safety    Ambulation/Gait Ambulation/Gait assistance: Min assist Gait Distance (Feet): 70 Feet Assistive device: Rolling walker (2 wheels) Gait Pattern/deviations: Step-through pattern, Decreased stride length, Trunk flexed   Gait velocity interpretation: <1.8 ft/sec, indicate of risk for recurrent falls   General Gait Details: min assist to direct and control RW, cues for posture and stepping into RW, limited by bil UE fatigue  Stairs            Wheelchair Mobility    Modified Rankin (Stroke Patients Only)       Balance Overall balance assessment: Needs assistance Sitting-balance support: No upper extremity supported, Feet supported Sitting balance-Leahy Scale: Fair     Standing balance support: Bilateral upper extremity supported, Reliant on assistive device for balance Standing balance-Leahy Scale: Poor Standing balance comment: bil UE on RW in standing                             Pertinent Vitals/Pain Pain Assessment Pain Assessment: No/denies pain    Home Living Family/patient expects to be discharged to:: Private residence Living Arrangements: Spouse/significant other Available Help at Discharge: Family;Available 24 hours/day Type of Home: Apartment Home Access: Stairs to enter  Entrance Stairs-Number of Steps: 1   Home Layout: One level Home Equipment: Chartered certified accountant      Prior Function Prior Level of Function : Independent/Modified Independent                     Hand  Dominance        Extremity/Trunk Assessment   Upper Extremity Assessment Upper Extremity Assessment: Generalized weakness    Lower Extremity Assessment Lower Extremity Assessment: Generalized weakness    Cervical / Trunk Assessment Cervical / Trunk Assessment: Normal  Communication   Communication: No difficulties  Cognition Arousal/Alertness: Awake/alert Behavior During Therapy: Flat affect Overall Cognitive Status: Impaired/Different from baseline Area of Impairment: Orientation, Safety/judgement, Problem solving                 Orientation Level: Disoriented to, Time       Safety/Judgement: Decreased awareness of deficits   Problem Solving: Slow processing          General Comments      Exercises     Assessment/Plan    PT Assessment Patient needs continued PT services  PT Problem List Decreased strength;Decreased mobility;Decreased safety awareness;Decreased activity tolerance;Decreased cognition;Decreased balance;Decreased knowledge of use of DME       PT Treatment Interventions Gait training;Therapeutic exercise;Patient/family education;Stair training;Balance training;Functional mobility training;DME instruction;Therapeutic activities    PT Goals (Current goals can be found in the Care Plan section)  Acute Rehab PT Goals Patient Stated Goal: return home, care for my horses PT Goal Formulation: With patient Time For Goal Achievement: 10/19/22 Potential to Achieve Goals: Good    Frequency Min 3X/week     Co-evaluation               AM-PAC PT "6 Clicks" Mobility  Outcome Measure Help needed turning from your back to your side while in a flat bed without using bedrails?: A Little Help needed moving from lying on your back to sitting on the side of a flat bed without using bedrails?: A Little Help needed moving to and from a bed to a chair (including a wheelchair)?: A Little Help needed standing up from a chair using your arms (e.g.,  wheelchair or bedside chair)?: A Little Help needed to walk in hospital room?: A Little Help needed climbing 3-5 steps with a railing? : A Lot 6 Click Score: 17    End of Session Equipment Utilized During Treatment: Gait belt;Oxygen Activity Tolerance: Patient tolerated treatment well Patient left: in chair;with call bell/phone within reach;with chair alarm set Nurse Communication: Mobility status PT Visit Diagnosis: Other abnormalities of gait and mobility (R26.89);Muscle weakness (generalized) (M62.81)    Time: 2130-8657 PT Time Calculation (min) (ACUTE ONLY): 29 min   Charges:   PT Evaluation $PT Eval Moderate Complexity: 1 Mod PT Treatments $Therapeutic Activity: 8-22 mins        Bayard Males, PT Acute Rehabilitation Services Office: [phone]   Lamarr Lulas 10/05/2022, 10:57 AM

## 2022-10-05 NOTE — Progress Notes (Signed)
   NAME:  Benjamin [name], MRN:  [id number], DOB:  03-Jun-1956, LOS: 2 ADMISSION DATE:  10/03/2022, CONSULTATION DATE:  1/28 REFERRING MD:  Pearline Cables, CHIEF COMPLAINT:  Septic shock   History of Present Illness:  67 y/o male with ESRD and cirrhosis here with RML CAP causing septic shock.  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 rate 15, height 6' (1.829 m), weight 89.9 kg, SpO2 99 %. CVP:  [17 mmHg-37 mmHg] 20 mmHg      Intake/Output Summary (Last 24 hours) at 10/05/2022 0835 Last data filed at 10/05/2022 0800 Gross per 24 hour  Intake 1898.81 ml  Output 1000 ml  Net 898.81 ml   Filed Weights   10/03/22 1451 10/04/22 1952 10/04/22 2345  Weight: 83.9 kg 90 kg 89.9 kg    Examination:  General:  Resting comfortably in bed HENT: NCAT OP clear PULM: CTA B, normal effort CV: RRR, no mgr GI: BS+, soft, nontender MSK: normal bulk and tone Derm: R arm dressing in place c/d/i Neuro: awake, alert, orientated to place and situation, [facility] Problem list     Assessment & Plan:  Septic shock from CAP in RML, organism unspecified Continue ceftriaxone/azithro Follow blood cultures Levophed for SBP > 90  Acute respiratory failure with hypoxemia from pneumonia Asthma Advance diet Wean off O2 for O2 saturation > 92% Pulmicort/brovana  ESRD Hypokalemia HD per renal, MWF Monitor BMET Replace K  Alcoholic  cirrhosis with ascites  Does not have SBP based on peritoneal fluid analysis Albumin given on 1/28, give again on 1/30    Hypoglycemia DM2 SSI Continue D10  HFrEF; 1/29 LVEF 40%; not in cardiogenic shock Hypertension Tele Continue to hold antihypertensives  Anemia of chronic disease Thrombocytopenia > improving Monitor for bleeding Transfuse PRBC for Hgb < 7 gm/dL  Concern for norepinephrine extravasation arm 1/29 Wound care  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   Will consult palliative medicine and will engage Lovena Le again.  Best Practice (right click and "Reselect all SmartList Selections" daily)   Diet/type: NPO DVT prophylaxis: prophylactic heparin  GI prophylaxis: N/A Lines: Dialysis Catheter and yes and it is still needed Foley:  removal ordered  Code Status:  full code Last date of multidisciplinary goals of care discussion [1/29 0900 son Lovena Le: full code; Wife thinks that he would not want CPR, shocks or life support but defers to [facility] for decisions.]  Critical care time: 32 minutes    Roselie Awkward, MD [facility] Pager: [phone] Cell: [phone] After 7:00 pm call Elink  [phone]

## 2022-10-05 NOTE — Progress Notes (Signed)
OT Cancellation Note  Patient Details Name: Benjamin [name] MRN: [id number] DOB: [birth year]   Cancelled Treatment:    Reason Eval/Treat Not Completed: Other (comment) Currently w/ lunch tray. Will follow up for OT eval  Layla Maw 10/05/2022, 1:13 PM

## 2022-10-05 NOTE — Progress Notes (Signed)
eLink Physician-Brief Progress Note Patient Name: Benjamin [name] DOB: Jun 17, 1956 MRN: [id number]   Date of Service  10/05/2022  HPI/Events of Note  ESRD, Cirrhosis, HFrEF that came in with shock from CAP. Not on vasopressors now, tolerating PO, and complaining of itching without new rash.   eICU Interventions  Added Hydroxyzine PRN      Intervention Category Minor Interventions: Routine modifications to care plan (e.g. PRN medications for pain, fever)  Imre Vecchione 10/05/2022, 12:55 AM

## 2022-10-05 NOTE — Evaluation (Signed)
Occupational Therapy Evaluation Patient Details Name: Benjamin [name] MRN: [id number] DOB: 07/08/1956 Today's Date: 10/05/2022   History of Present Illness 67 yo male admitted 1/28 with AMS and hypoglycemia. Moderate ascites with diagnostic paracentesis in ED. 1/29 RUE infiltration. PMHx: T2DM, ESRD on HD MWF, HFrEF, cirrhosis   Clinical Impression   PTA, pt lives with spouse, typically Modified Independent with ADLs, mobility with RW and caring for his 3 horses. Pt presents now with deficits in standing balance, cognition, and overall strength. Pt showing insight into current deficits. Overall, pt requires up to Min A for mobility using RW to manage DME and maintain balance. Pt requires Setup to Min A for ADLs d/t deficits. Anticipate good progress w/ continued mobility at acute level in order to DC home w/ [facility] therapy services.  SpO2 > 96% on RA BP supine: 99/49 BP during activity: 139/63 BP back to supine: 98/32 (question accuracy of this as pt asymptomatic)       Recommendations for follow up therapy are one component of a multi-disciplinary discharge planning process, led by the attending physician.  Recommendations may be updated based on patient status, additional functional criteria and insurance authorization.   Follow Up Recommendations  Home health OT     Assistance Recommended at Discharge Intermittent Supervision/Assistance  Patient can return home with the following A little help with walking and/or transfers;A little help with bathing/dressing/bathroom;Assistance with cooking/housework    Functional Status Assessment  Patient has had a recent decline in their functional status and demonstrates the ability to make significant improvements in function in a reasonable and predictable amount of time.  Equipment Recommendations  None recommended by OT    Recommendations for Other Services       Precautions / Restrictions Precautions Precautions: Fall;Other  (comment) Precaution Comments: watch sats and BP; fragile skin Restrictions Weight Bearing Restrictions: No      Mobility Bed Mobility Overal bed mobility: Needs Assistance Bed Mobility: Supine to Sit, Sit to Supine     Supine to sit: HOB elevated, Min guard Sit to supine: Min assist   General bed mobility comments: assist for BLE back to bed    Transfers Overall transfer level: Needs assistance Equipment used: Rolling walker (2 wheels) Transfers: Sit to/from Stand Sit to Stand: Min assist                  Balance Overall balance assessment: Needs assistance Sitting-balance support: No upper extremity supported, Feet supported Sitting balance-Leahy Scale: Fair     Standing balance support: Bilateral upper extremity supported, Reliant on assistive device for balance Standing balance-Leahy Scale: Poor                             ADL either performed or assessed with clinical judgement   ADL Overall ADL's : Needs assistance/impaired Eating/Feeding: Independent   Grooming: Standing;Brushing hair;Wash/dry face;Min guard Grooming Details (indicate cue type and reason): min guard for balance Upper Body Bathing: Supervision/ safety   Lower Body Bathing: Minimal assistance   Upper Body Dressing : Set up   Lower Body Dressing: Minimal assistance   Toilet Transfer: Minimal assistance;Ambulation;Rolling walker (2 wheels)   Toileting- Clothing Manipulation and Hygiene: Minimal assistance;Sitting/lateral lean;Sit to/from stand       Functional mobility during ADLs: Minimal assistance;Rolling walker (2 wheels);Cueing for sequencing;Cueing for safety General ADL Comments: light assist with RW manuvering and balance w/ dynamic tasks, pt showing insight into balance deficits. able to  stand at sink and mobilize in room for ADLs without difficulty     Vision Ability to See in Adequate Light: 0 Adequate Patient Visual Report: No change from baseline Vision  Assessment?: No apparent visual deficits     Perception     Praxis      Pertinent Vitals/Pain Pain Assessment Pain Assessment: No/denies pain     Hand Dominance Right   Extremity/Trunk Assessment Upper Extremity Assessment Upper Extremity Assessment: Generalized weakness;RUE deficits/detail RUE Deficits / Details: very fragile skin on B UE. RN present to wrap L UE and assess w/ noted small skin tears   Lower Extremity Assessment Lower Extremity Assessment: Defer to PT evaluation   Cervical / Trunk Assessment Cervical / Trunk Assessment: Normal   Communication Communication Communication: No difficulties   Cognition Arousal/Alertness: Awake/alert Behavior During Therapy: WFL for tasks assessed/performed, Flat affect Overall Cognitive Status: Impaired/Different from baseline Area of Impairment: Safety/judgement, Problem solving, Attention, Awareness                   Current Attention Level: Selective     Safety/Judgement: Decreased awareness of safety, Decreased awareness of deficits Awareness: Emergent Problem Solving: Slow processing, Requires verbal cues General Comments: cues for problem solving, appears w/ delayed responses at times but fairly quick with humor response. does endorse not feeling mind is back to 100% yet. able to name the names of all 3 of his horses with increased time     General Comments       Exercises     Shoulder Instructions      Home Living Family/patient expects to be discharged to:: Private residence Living Arrangements: Spouse/significant other Available Help at Discharge: Family;Available 24 hours/day Type of Home: Apartment Home Access: Stairs to enter Entrance Stairs-Number of Steps: 1   Home Layout: One level     Bathroom Shower/Tub: Tub/shower unit;Walk-in shower   Bathroom Toilet: Handicapped height     Home Equipment: Chartered certified accountant          Prior Functioning/Environment Prior Level of Function :  Independent/Modified Independent               ADLs Comments: takes care of 3 horses at baseline        OT Problem List: Decreased strength;Decreased activity tolerance;Impaired balance (sitting and/or standing);Decreased cognition      OT Treatment/Interventions: Self-care/ADL training;Therapeutic exercise;Energy conservation;DME and/or AE instruction;Therapeutic activities;Balance training;Patient/family education    OT Goals(Current goals can be found in the care plan section) Acute Rehab OT Goals Patient Stated Goal: improve balance OT Goal Formulation: With patient Time For Goal Achievement: 10/19/22 Potential to Achieve Goals: Good ADL Goals Pt Will Perform Lower Body Bathing: with modified independence;sit to/from stand Pt Will Perform Lower Body Dressing: with modified independence;sit to/from stand Pt Will Transfer to Toilet: with modified independence;ambulating Additional ADL Goal #1: Pt to demo ability to gather ADL/IADL items MOD I without LOB  OT Frequency: Min 2X/week    Co-evaluation              AM-PAC OT "6 Clicks" Daily Activity     Outcome Measure Help from another person eating meals?: None Help from another person taking care of personal grooming?: A Little Help from another person toileting, which includes using toliet, bedpan, or urinal?: A Little Help from another person bathing (including washing, rinsing, drying)?: A Little Help from another person to put on and taking off regular upper body clothing?: A Little Help from another person to put on  and taking off regular lower body clothing?: A Little 6 Click Score: 19   End of Session Equipment Utilized During Treatment: Gait belt;Rolling walker (2 wheels) Nurse Communication: Mobility status  Activity Tolerance: Patient tolerated treatment well Patient left: in bed;with call bell/phone within reach;with bed alarm set  OT Visit Diagnosis: Unsteadiness on feet (R26.81);Other abnormalities  of gait and mobility (R26.89)                Time: 9563-8756 OT Time Calculation (min): 31 min Charges:  OT General Charges $OT Visit: 1 Visit OT Evaluation $OT Eval Moderate Complexity: 1 Mod OT Treatments $Self Care/Home Management : 8-22 mins  Malachy Chamber, OTR/L Acute Rehab Services Office: [phone]   Layla Maw 10/05/2022, 2:52 PM

## 2022-10-05 NOTE — Progress Notes (Signed)
[facility] NEPHROLOGY PROGRESS NOTE  Assessment/ Plan: OP HD orders:  [place] MWF 4h  400/600  86.9kg  3K/2.5Ca bath  TDC  RIJ + LUA AVF  Heparin none - last HD 1/26, post wt 88.3kg   - soft BP's at HD in 90s for last 2 wks at minimum - venofer '50mg'$  weekly - mircera 30 mcg q4 wks, last on 1/15, due 2/12    # Septic shock from pneumonia/RML: Currently on ceftriaxone, azithromycin.  Still on Levophed, trying to titrate down in ICU.  # ESRD MWF: Status post dialysis yesterday with around 1 L ultrafiltration, tolerated well with the help of Levophed.  Volume looks acceptable.  Currently has tunneled HD catheter for the access.  Next HD tomorrow.  # Acute hypoxic respiratory failure due to pneumonia: On oxygen, antibiotics.  Per pulmonary team.  # Anemia of CKD: Hemoglobin at goal.  [facility] Rast as outpatient.  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  Currently NPO.  # Hyponatremia: Managed with HD.  UF as tolerated.  # Hypokalemia: Replete potassium chloride.  Subjective: Seen and examined in ICU.  Tolerated HD well with the help of Levophed.  He looks more alert awake this morning.  Still on Levophed.  Discussed with the ICU team.  Objective Vital signs in last 24 hours: Vitals:   10/05/22 0800 10/05/22 0815 10/05/22 0842 10/05/22 0845  BP: 124/69 (!) 131/44  (!) 133/56  Pulse: 64 63  63  Resp: '18 15  16  '$ Temp:      TempSrc:      SpO2: 99% 99% 92% 99%  Weight:      Height:       Weight change: 6.084 kg  Intake/Output Summary (Last 24 hours) at 10/05/2022 1031 Last data filed at 10/05/2022 0800 Gross per 24 hour  Intake 1781.68 ml  Output 1000 ml  Net 781.68 ml        Labs: RENAL PANEL Recent Labs    03/31/22 0710 10/03/22 1450 10/03/22 2000 10/03/22 2302 10/04/22 0712 10/05/22 0430  NA 126* 127* 132*  --  130* 128*  K 3.9 3.4* 3.7  --  3.8 3.0*  CL 92* 94*  --   --  92* 93*  CO2  --  20*  --   --  22 25  GLUCOSE 77 113*   --   --  70 85  BUN 31* 17  --   --  17 10  CREATININE 4.20* 4.77*  --  3.87* 4.76* 3.19*  CALCIUM  --  8.2*  --   --  8.2* 7.9*  MG  --   --   --  1.4* 2.1 1.8  PHOS  --   --   --   --  4.8*  4.7*  --   ALBUMIN  --  2.4*  --   --   --   --       Liver Function Tests: Recent Labs  Lab 10/03/22 1450  AST 34  ALT 14  ALKPHOS 88  BILITOT 2.1*  PROT 6.3*  ALBUMIN 2.4*    Recent Labs  Lab 10/03/22 1447  LIPASE 26    Recent Labs  Lab 10/03/22 1449  AMMONIA 34    CBC: Recent Labs    10/03/22 1447 10/03/22 2000 10/03/22 2302 10/04/22 0712 10/05/22 0430  HGB 12.1* 12.9* 9.5* 11.7* 10.4*  MCV 94.8  --  94.6 97.5 91.2     Cardiac Enzymes: No results for input(s): "  CKTOTAL", "CKMB", "CKMBINDEX", "TROPONINI" in the last 168 hours. CBG: Recent Labs  Lab 10/04/22 1550 10/04/22 2004 10/04/22 2343 10/05/22 0344 10/05/22 0741  GLUCAP 72 75 83 85 74     Iron Studies: No results for input(s): "IRON", "TIBC", "TRANSFERRIN", "FERRITIN" in the last 72 hours. Studies/Results: ECHOCARDIOGRAM COMPLETE  Result Date: 10/04/2022    ECHOCARDIOGRAM REPORT   Patient Name:   DEARIES MEIKLE Date of Exam: 10/04/2022 Medical Rec #:  [id number]       Height:       72.0 in Accession #:    [id number]      Weight:       185.0 lb Date of Birth:  [birth year]       BSA:          2.061 m Patient Age:    67 years        BP:           88/57 mmHg Patient Gender: M               HR:           75 bpm. Exam Location:  Inpatient Procedure: 2D Echo, Cardiac Doppler, Color Doppler, Strain Analysis and 3D Echo Indications:     V95.63 Acute systolic (congestive) heart failure  History:         Patient has prior history of Echocardiogram examinations, most                  recent 05/07/2022. ESRD; Risk Factors:Hypertension and                  Non-Smoker.  Sonographer:     Wilkie Aye RVT Referring Phys:  [id number] Chrystie Nose PAYNE Diagnosing Phys: Kirk Ruths McleanMD IMPRESSIONS  1. Left ventricular ejection fraction,  by estimation, is 40%. The left ventricle has mild to moderately decreased function. The left ventricle demonstrates global hypokinesis. Left ventricular diastolic parameters are consistent with Grade II diastolic dysfunction (pseudonormalization). The average left ventricular global longitudinal strain is -10.6 %. The global longitudinal strain is abnormal.  2. Mildly D-shaped interventricular septum suggestive of RV pressure/volume overload. Right ventricular systolic function is mildly reduced. The right ventricular size is mildly enlarged. There is mildly elevated pulmonary artery systolic pressure. The estimated right ventricular systolic pressure is 29.5 mmHg.  3. Left atrial size was moderately dilated.  4. Right atrial size was moderately dilated.  5. The mitral valve is normal in structure. Trivial mitral valve regurgitation. No evidence of mitral stenosis.  6. The aortic valve is tricuspid. There is moderate calcification of the aortic valve. Aortic valve regurgitation is trivial. Moderate aortic valve stenosis. Aortic valve area, by VTI measures 1.56 cm. Aortic valve mean gradient measures 26.0 mmHg.  7. Tricuspid valve regurgitation is moderate to severe.  8. The inferior vena cava is dilated in size with <50% respiratory variability, suggesting right atrial pressure of 15 mmHg.  9. Ascites noted. FINDINGS  Left Ventricle: Left ventricular ejection fraction, by estimation, is 40%. The left ventricle has mild to moderately decreased function. The left ventricle demonstrates global hypokinesis. The average left ventricular global longitudinal strain is -10.6  %. The global longitudinal strain is abnormal. The left ventricular internal cavity size was normal in size. There is no left ventricular hypertrophy. Left ventricular diastolic parameters are consistent with Grade II diastolic dysfunction (pseudonormalization). Right Ventricle: Mildly D-shaped interventricular septum suggestive of RV pressure/volume  overload. The right ventricular size is mildly enlarged. No increase in right  ventricular wall thickness. Right ventricular systolic function is mildly reduced. There is mildly elevated pulmonary artery systolic pressure. The tricuspid regurgitant velocity is 2.43 m/s, and with an assumed right atrial pressure of 15 mmHg, the estimated right ventricular systolic pressure is 32.6 mmHg. Left Atrium: Left atrial size was moderately dilated. Right Atrium: Right atrial size was moderately dilated. Pericardium: There is no evidence of pericardial effusion. Mitral Valve: The mitral valve is normal in structure. Trivial mitral valve regurgitation. No evidence of mitral valve stenosis. Tricuspid Valve: The tricuspid valve is normal in structure. Tricuspid valve regurgitation is moderate to severe. Aortic Valve: The aortic valve is tricuspid. There is moderate calcification of the aortic valve. Aortic valve regurgitation is trivial. Aortic regurgitation PHT measures 1354 msec. Moderate aortic stenosis is present. Aortic valve mean gradient measures  26.0 mmHg. Aortic valve peak gradient measures 37.9 mmHg. Aortic valve area, by VTI measures 1.56 cm. Pulmonic Valve: The pulmonic valve was normal in structure. Pulmonic valve regurgitation is not visualized. Aorta: The aortic root is normal in size and structure. Venous: The inferior vena cava is dilated in size with less than 50% respiratory variability, suggesting right atrial pressure of 15 mmHg. IAS/Shunts: No atrial level shunt detected by color flow Doppler.  LEFT VENTRICLE PLAX 2D LVIDd:         5.10 cm   Diastology LVIDs:         3.50 cm   LV e' medial:    6.41 cm/s LV PW:         1.20 cm   LV E/e' medial:  17.0 LV IVS:        1.10 cm   LV e' lateral:   13.60 cm/s LVOT diam:     2.20 cm   LV E/e' lateral: 8.0 LV SV:         105 LV SV Index:   51        2D Longitudinal Strain LVOT Area:     3.80 cm  2D Strain GLS Avg:     -10.6 %                           3D Volume EF:                           3D EF:        48 %                          LV EDV:       216 ml                          LV ESV:       112 ml                          LV SV:        104 ml RIGHT VENTRICLE            IVC RV Basal diam:  4.60 cm    IVC diam: 2.50 cm RV Mid diam:    4.10 cm RV S prime:     6.86 cm/s TAPSE (M-mode): 2.1 cm LEFT ATRIUM             Index        RIGHT ATRIUM  Index LA diam:        4.80 cm 2.33 cm/m   RA Area:     33.50 cm LA Vol (A2C):   92.1 ml 44.68 ml/m  RA Volume:   130.00 ml 63.07 ml/m LA Vol (A4C):   74.4 ml 36.07 ml/m LA Biplane Vol: 88.2 ml 42.79 ml/m  AORTIC VALVE                     PULMONIC VALVE AV Area (Vmax):    1.54 cm      PV Vmax:       0.89 m/s AV Area (Vmean):   1.52 cm      PV Peak grad:  3.2 mmHg AV Area (VTI):     1.56 cm AV Vmax:           308.00 cm/s AV Vmean:          244.000 cm/s AV VTI:            0.675 m AV Peak Grad:      37.9 mmHg AV Mean Grad:      26.0 mmHg LVOT Vmax:         125.00 cm/s LVOT Vmean:        97.600 cm/s LVOT VTI:          0.277 m LVOT/AV VTI ratio: 0.41 AI PHT:            1354 msec AR Vena Contracta: 0.60 cm  AORTA Ao Root diam: 3.40 cm Ao Asc diam:  3.20 cm MITRAL VALVE                TRICUSPID VALVE MV Area (PHT): 3.43 cm     TR Peak grad:   23.6 mmHg MV Decel Time: 221 msec     TR Vmax:        243.00 cm/s MV E velocity: 109.00 cm/s MV A velocity: 39.00 cm/s   SHUNTS MV E/A ratio:  2.79         Systemic VTI:  0.28 m                             Systemic Diam: 2.20 cm Dalton McleanMD Electronically signed by Franki Monte Signature Date/Time: 10/04/2022/4:13:32 PM    Final (Updated)    DG CHEST PORT 1 VIEW  Result Date: 10/04/2022 CLINICAL DATA:  Central line placement EXAM: PORTABLE CHEST 1 VIEW COMPARISON:  Chest radiograph 10/03/2022 FINDINGS: Dual lumen central venous catheter tip projects over the right atrium, stable. Interval insertion left IJ central venous catheter with tip projecting at the central left  brachiocephalic vein. Stable cardiomegaly. Low lung volumes. Basilar heterogeneous opacities. No pleural effusion or pneumothorax. Multiple old right rib fractures. IMPRESSION: 1. Interval insertion left IJ central venous catheter with tip projecting at the central left brachiocephalic vein. 2. Low lung volumes with basilar atelectasis. Electronically Signed   By: Lovey Newcomer M.D.   On: 10/04/2022 12:19   CT CHEST ABDOMEN PELVIS WO CONTRAST  Result Date: 10/03/2022 CLINICAL DATA:  Hypoglycemia, altered mental status, slurred speech EXAM: CT CHEST, ABDOMEN AND PELVIS WITHOUT CONTRAST TECHNIQUE: Multidetector CT imaging of the chest, abdomen and pelvis was performed following the standard protocol without IV contrast. RADIATION DOSE REDUCTION: This exam was performed according to the departmental dose-optimization program which includes automated exposure control, adjustment of the mA and/or kV according to patient size and/or use of iterative reconstruction technique. COMPARISON:  CTA chest dated 09/24/2022. CT abdomen/pelvis dated 04/17/2021. FINDINGS: CT CHEST FINDINGS Cardiovascular: Mild cardiomegaly.  No pericardial effusion. No evidence of thoracic aortic aneurysm. Atherosclerotic calcifications of the aortic root and arch. Severe three-vessel coronary atherosclerosis. Right IJ dialysis catheter terminates in the lower right atrium. Mediastinum/Nodes: No suspicious mediastinal lymphadenopathy. Visualized thyroid is unremarkable. Lungs/Pleura: Evaluation lung parenchyma is constrained by respiratory motion. Within that constraint, there are no suspicious pulmonary nodules. Mild dependent patchy opacities in the lungs bilaterally, posterior right middle lobe (series 5/image 88) and left lower lobe predominant (series 5/image 111). This appearance favors mild aspiration over atelectasis. No pleural effusion or pneumothorax. Musculoskeletal: Mild degenerative changes of the lower thoracic spine. Old healing and  healed right rib fractures. CT ABDOMEN PELVIS FINDINGS Hepatobiliary: Cirrhotic configuration of the liver. Cholelithiasis, without associated inflammatory changes. No intrahepatic or extrahepatic duct dilatation. Pancreas: Within normal limits. Spleen: Within normal limits. Adrenals/Urinary Tract: Adrenal glands are within normal limits. Kidneys are within normal limits. No renal calculi or hydronephrosis. Bladder is decompressed. Stomach/Bowel: Stomach is within normal limits. No evidence of bowel obstruction. Appendix is not discretely visualized. No colonic wall thickening or inflammatory changes. Vascular/Lymphatic: No evidence of abdominal aortic aneurysm. Atherosclerotic calcifications of the abdominal aorta and branch vessels. No suspicious abdominopelvic lymphadenopathy. Reproductive: Prostate is unremarkable. Other: Moderate abdominopelvic ascites, new from 2022 but unchanged in the upper abdomen from recent CT chest. Musculoskeletal: Degenerative changes of the lumbar spine. Old right stone superior and inferior pubic rami fracture deformities. IMPRESSION: Mild dependent patchy opacities in the lungs bilaterally, favoring mild aspiration over atelectasis. Cirrhosis. Moderate abdominopelvic ascites. Cholelithiasis, without associated inflammatory changes. Additional ancillary findings as above. Electronically Signed   By: Julian Hy M.D.   On: 10/03/2022 19:07   DG Chest Portable 1 View  Result Date: 10/03/2022 CLINICAL DATA:  Altered mental status. EXAM: PORTABLE CHEST 1 VIEW COMPARISON:  September 24, 2022. FINDINGS: Stable cardiomegaly. Right internal jugular dialysis catheter is unchanged. Both lungs are clear. The visualized skeletal structures are unremarkable. IMPRESSION: No active disease. Electronically Signed   By: Marijo Conception M.D.   On: 10/03/2022 15:47    Medications: Infusions:  sodium chloride Stopped (10/03/22 1919)   sodium chloride 10 mL/hr at 10/05/22 0800   [START ON  10/06/2022] cefTRIAXone (ROCEPHIN)  IV     dextrose 25 mL/hr at 10/05/22 0800   norepinephrine (LEVOPHED) Adult infusion 7 mcg/min (10/05/22 0922)    Scheduled Medications:  arformoterol  15 mcg Nebulization BID   azithromycin  500 mg Oral Daily   budesonide (PULMICORT) nebulizer solution  0.5 mg Nebulization BID   Chlorhexidine Gluconate Cloth  6 each Topical X2119   folic acid  1 mg Oral Daily   heparin  5,000 Units Subcutaneous Q8H   magnesium oxide  400 mg Oral Once   midodrine  10 mg Oral Q8H   multivitamin with minerals  1 tablet Oral Daily   mupirocin ointment  1 Application Nasal BID   nitroGLYCERIN  1 inch Topical Q8H   mouth rinse  15 mL Mouth Rinse 4 times per day   thiamine  100 mg Oral Daily    have reviewed scheduled and prn medications.  Physical Exam: General:NAD, comfortable, more alert and awake. Heart:RRR, s1s2 nl Lungs: Basal rhonchi, no increased work of breathing Abdomen:soft, Non-tender, non-distended Extremities: Trace peripheral  edema Dialysis Access: RIJ TDC in place.  Tailyn Hantz Prasad Amilcar Reever 10/05/2022,10:31 AM  LOS: 2 days

## 2022-10-05 NOTE — Progress Notes (Signed)
Patient admitted for septic shock, hypotensive and required levophed. PMH ESRD on HD MWF, HFrEF (LVEF 20%), DMT2, prior EtOH abuse with cirrhosis presents to [facility] ED on 1/28. TOC following.

## 2022-10-05 NOTE — TOC Initial Note (Signed)
Transition of Care [facility]) - Initial/Assessment Note    Patient Details  Name: Benjamin [name] MRN: [id number] Date of Birth: 1955/09/11  Transition [facility]) CM/SW Contact:    Cyndi Bender, RN Phone Number: 10/05/2022, 4:35 PM  Clinical Narrative:                  Spoke to wife, Butch Penny, and son , Lovena Le, regarding transition needs. Both are agreeable to home health. Patient has a walker and wheelchair. Patient has family support for transportation to apts.  Choice offered for home health. Lovena Le defers to this RNCM to find highly rated agency.  Cory with [facility] accepted referral.  Address, Phone number and PCP verified.  TOC will continue to follow for needs.  Need home health PT/OT/aide orders.  Expected Discharge Plan: [place] Barriers to Discharge: Continued Medical Work up   Patient Goals and CMS Choice Patient states their goals for this hospitalization and ongoing recovery are:: wife and son want patient to return home CMS Medicare.gov Compare Post Acute Care list provided to:: Patient Represenative (must comment) Choice offered to / list presented to : Adult Children      Expected Discharge Plan and Services   Discharge Planning Services: CM Consult Post Acute Care Choice: Home Health Living arrangements for the past 2 months: Single Family Home                           HH Arranged: PT, OT, Nurse's Aide HH Agency: [facility]        Prior Living Arrangements/Services Living arrangements for the past 2 months: Single Family Home Lives with:: Spouse              Current home services: DME (walker, wheelchair)    Activities of Daily Living      Permission Sought/Granted                  Emotional Assessment              Admission diagnosis:  Shock ([place]) [R57.9] Thrombocytopenia ([place]) [D69.6] ESRD on dialysis ([place]) [W09.8, J19.1] Alcoholic cirrhosis of liver with ascites ([place])  [K70.31] Acute respiratory failure with hypoxia ([place]) [J96.01] Septic shock ([place]) [A41.9, R65.21] Patient Active Problem List   Diagnosis Date Noted   Septic shock ([place]) 47/82/9562   Alcoholic cirrhosis of liver with ascites ([place]) 10/03/2022   Acute respiratory failure with hypoxia ([place]) 10/03/2022   ESRD on dialysis ([place]) 10/03/2022   Thrombocytopenia ([place]) 13/04/6577   Chronic systolic CHF (congestive heart failure) ([place]) 08/04/2021   Diabetes mellitus type 2 in nonobese ([place]) 08/04/2021   Hyponatremia 03/26/2021   CKD (chronic kidney disease) stage 5, GFR 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 ([name]) 03/26/2021   Acute renal failure (ARF) ([place]) 03/04/2020   Hyperkalemia 46/96/2952   Metabolic acidosis 84/13/2440   Hypertensive urgency 03/04/2020   Anemia 03/04/2020   PCP:  Mateo Flow, MD Pharmacy:   [facility], [place] [address] Phone: [phone] Fax: [phone]     Social Determinants of Health (SDOH) Social History: SDOH Screenings   Tobacco Use: Medium Risk (10/03/2022)   SDOH Interventions:     Readmission Risk Interventions     No data to display

## 2022-10-06 DIAGNOSIS — Z515 Encounter for palliative care: Secondary | ICD-10-CM

## 2022-10-06 DIAGNOSIS — R579 Shock, unspecified: Secondary | ICD-10-CM

## 2022-10-06 DIAGNOSIS — Z789 Other specified health status: Secondary | ICD-10-CM

## 2022-10-06 LAB — PATHOLOGIST SMEAR REVIEW

## 2022-10-06 LAB — BASIC METABOLIC PANEL
Anion gap: 12 (ref 5–15)
BUN: 16 mg/dL (ref 8–23)
CO2: 24 mmol/L (ref 22–32)
Calcium: 8.2 mg/dL — ABNORMAL LOW (ref 8.9–10.3)
Chloride: 91 mmol/L — ABNORMAL LOW (ref 98–111)
Creatinine, Ser: 3.86 mg/dL — ABNORMAL HIGH (ref 0.61–1.24)
GFR, Estimated: 17 mL/min — ABNORMAL LOW (ref >60)
Glucose, Bld: 126 mg/dL — ABNORMAL HIGH (ref 70–99)
Potassium: 3.5 mmol/L (ref 3.5–5.1)
Sodium: 127 mmol/L — ABNORMAL LOW (ref 135–145)

## 2022-10-06 LAB — GLUCOSE, CAPILLARY
Glucose-Capillary: 107 mg/dL — ABNORMAL HIGH (ref 70–99)
Glucose-Capillary: 75 mg/dL (ref 70–99)
Glucose-Capillary: 121 mg/dL — ABNORMAL HIGH (ref 70–99)
Glucose-Capillary: 137 mg/dL — ABNORMAL HIGH (ref 70–99)
Glucose-Capillary: 122 mg/dL — ABNORMAL HIGH (ref 70–99)
Glucose-Capillary: 100 mg/dL — ABNORMAL HIGH (ref 70–99)

## 2022-10-06 MED ORDER — ANTICOAGULANT SODIUM CITRATE 4% (200MG/5ML) IV SOLN: 5.0000 mL | Status: DC | PRN

## 2022-10-06 MED ORDER — HEPARIN SODIUM (PORCINE) 1000 UNIT/ML DIALYSIS: 20.0000 [IU]/kg | INTRAMUSCULAR | Status: DC | PRN

## 2022-10-06 MED ORDER — HEPARIN SODIUM (PORCINE) 1000 UNIT/ML DIALYSIS: 1000.0000 [IU] | INTRAMUSCULAR | Status: DC | PRN

## 2022-10-06 MED ORDER — HYDROXYZINE HCL 25 MG PO TABS
25.0000 mg | ORAL_TABLET | Freq: Every day | ORAL | Status: DC | PRN
  Administered 2022-10-06: 25 mg via ORAL
  Filled 2022-10-06: qty 1

## 2022-10-06 MED ORDER — MIDODRINE HCL 5 MG PO TABS
20.0000 mg | ORAL_TABLET | Freq: Three times a day (TID) | ORAL | Status: DC
  Administered 2022-10-06 – 2022-10-13 (×21): 20 mg via ORAL
  Filled 2022-10-06 (×22): qty 4

## 2022-10-06 MED ORDER — ALTEPLASE 2 MG IJ SOLR: 2.0000 mg | Freq: Once | INTRAMUSCULAR | Status: DC | PRN

## 2022-10-06 NOTE — Progress Notes (Signed)
eLink Physician-Brief Progress Note Patient Name: Benjamin [name] DOB: [birth year] MRN: [id number]   Date of Service  10/06/2022  HPI/Events of Note  c/o itching. received dose of atarax the other day that helped.   ESRD with HD MWF Cirrhosis due to cirrhosis  eICU Interventions  Repeat Atarax     Intervention Category Minor Interventions: Routine modifications to care plan (e.g. PRN medications for pain, fever)  Khyren Hing 10/06/2022, 8:41 PM

## 2022-10-06 NOTE — Progress Notes (Signed)
   NAME:  Benjamin [name], MRN:  [id number], DOB:  June 14, 1956, LOS: 3 ADMISSION DATE:  10/03/2022, CONSULTATION DATE:  1/28 REFERRING MD:  Pearline Cables, CHIEF COMPLAINT:  Septic shock   History of Present Illness:  67 y/o male with ESRD and cirrhosis here with RML CAP causing septic shock.  Pertinent  Medical History  ESRD with HD MWF Cirrhosis due to cirrhosis HFrEF DM2 EtOH abuse   Significant Hospital Events: Including procedures, antibiotic start and stop dates in addition to other pertinent events   1/28 septic shock from CAP, on levophed 1/29 HD: 1 liter removed; TTE: LVEF 02%, grade 2 diastolic dysfunction; RV function mildly reduced, RV size mildly enlarged PASP estimate 38.6 mmHg, LAE, RAE 1/30 briefly off levophed during daytime, walked with PT with a walker  Interim History / Subjective:  Off levophed yesterday for a while Walked with PT For HD this morning  Objective   Blood pressure 112/75, pulse 64, temperature (!) 97.4 F (36.3 C), temperature source Oral, resp. rate 20, height 6' (1.829 m), weight 89.9 kg, SpO2 100 %. CVP:  [1 mmHg-26 mmHg] 1 mmHg      Intake/Output Summary (Last 24 hours) at 10/06/2022 6378 Last data filed at 10/06/2022 0800 Gross per 24 hour  Intake 1777.55 ml  Output --  Net 1777.55 ml   Filed Weights   10/03/22 1451 10/04/22 1952 10/04/22 2345  Weight: 83.9 kg 90 kg 89.9 kg    Examination:  General:  Resting comfortably in bed on hemodialysis HENT: NCAT OP clear PULM: CTA B, normal effort CV: RRR, no mgr GI: BS+, soft, nontender MSK: normal bulk and tone Derm: R arm dressing c/d/i Neuro: awake, alert, no distress, MAEW    Resolved Hospital Problem list     Assessment & Plan:  Septic shock from CAP in RML, organism unspecified Ceftriaxone/azithro for 5 days Follow blood cultures Levophed for SBP > 90  Acute respiratory failure with hypoxemia from pneumonia Asthma Wean off O2 for O2 saturation >  92% Pulmicort/brovana  ESRD Hypokalemia Hyponatremia HD per renal, MWF Monitor BMET  Alcoholic cirrhosis with ascites  Does not have SBP based on peritoneal fluid analysis LFT's prn Monitor exam  Hypoglycemia due to cirrhosis DM2 SSI Continue D10 until po intake has increased  HFrEF; 1/29 LVEF 40%; not in cardiogenic shock Hypertension Tele Hold antihypertensives  Anemia of chronic disease Thrombocytopenia > improving Monitor for bleeding Transfuse PRBC for Hgb < 7 gm/dL  Concern for norepinephrine extravasation arm 1/29 Wound care  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  Joylene John on 1/30 to discuss goals of care with his dad and consider time limited trial of mechanical ventilation if decision is to remain full code.    Best Practice (right click and "Reselect all SmartList Selections" daily)   Diet/type: NPO DVT prophylaxis: prophylactic heparin  GI prophylaxis: N/A Lines: Dialysis Catheter and yes and it is still needed Foley:  removal ordered  Code Status:  full code Last date of multidisciplinary goals of care discussion [1/30, see iPAL note]  Critical care time: 31 minutes    Roselie Awkward, MD [place] [facility] Pager: [phone] Cell: [phone] After 7:00 pm call [place]  [phone]

## 2022-10-06 NOTE — Progress Notes (Signed)
   10/06/22 1237  Vitals  Pulse Rate 65  Resp (!) 27  BP 115/69  SpO2 99 %  Oxygen Therapy  Patient Activity (if Appropriate) In bed  Pulse Oximetry Type Continuous  Post Treatment  Dialyzer Clearance Clear  Duration of HD Treatment -hour(s) 4 hour(s)  Hemodialysis Intake (mL) 0 mL  Liters Processed 95.9  Fluid Removed (mL) 2000 mL  Tolerated HD Treatment Yes   Received patient in bed IN unit.  Alert and oriented.  Informed consent signed and in chart.   Treatment initiated: 827 Treatment completed: 1230  Patient tolerated well.   Alert, without acute distress.  Hand-off given to patient's nurse.   Access used: rdc Access issues: NONE  Total UF removed: 2L Medication(s) given: NONE    Na'Shaminy T Shaquelle [facility]

## 2022-10-06 NOTE — Progress Notes (Addendum)
This chaplain responded to family's request for prayer with the Pt. The Pt. is awake. The Pt. RN-Alexis is helping the Pt. prepare to eat breakfast. The Pt. and chaplain agree on a visit after breakfast.  **1435 Attempted revisit. Pt. receiving a bath and Pt. wife-Donna stepped out of the room.  Plans made to revisit.  Chaplain Sallyanne Kuster [phone]

## 2022-10-06 NOTE — Consult Note (Signed)
Consultation Note Date: 10/06/2022 at 0830  Patient Name: Benjamin [name]  DOB: [birth year]  MRN: [id number]  Age / Sex: 67 y.o., male  PCP: Mateo Flow, MD Referring Physician: Juanito Doom, MD  Reason for Consultation: Establishing goals of care  HPI/Patient Profile: 67 y.o. male  with past medical history of EtOH abuse, cirrhosis, ESRD (MWF-[place]), advanced heart failure (EF 20%), and type 2 diabetes admitted on 10/03/2022 with AMS.  Patient is being treated for septic shock related to R ML space CAP (ceftriaxone and Zithromax), ARF, hypokalemia, and hypoglycemia.  Patient also receiving Levophed gtt. to support pressures during HD sessions.  Patient currently in bedside HD session.  Clinical Assessment and Goals of Care: I have reviewed medical records including EPIC notes, labs and imaging, assessed the patient and then met with patient at bedside to discuss diagnosis prognosis, GOC, EOL wishes, disposition and options.  I introduced Palliative Medicine as specialized medical care for people living with serious illness. It focuses on providing relief from the symptoms and stress of a serious illness. The goal is to improve quality of life for both the patient and the family.  We discussed a brief life review of the patient.  Patient can recall that he is married but cannot recall for how long or how he met his wife.  He says he used to work in [employer] but again cannot recall for how long.  He says he would like to go back to work part-time when he gets out of the hospital.  He shares that he has stepchildren and 1 son, Lovena Le, to whom he is given medical POA.  As far as functional and nutritional status patient states he cannot really remember how he was doing PTA.   I attempted to elicit values and goals of care important to the patient.  Patient says that he wants to get up, walk with a walker, and  be able to go home again.  He wants to continue to work part-time.  As far as medical decision making, we discussed advance directives, code status, and artificial feeding/hydration.  Patient shares that he wants to "live as long as I can".  He is accepting of mechanical ventilation/life support.  However, he shared that if he does not have a "chance at being saved", then he would be in agreement to remove artificial support.    Patient was clear that he would want to remain a full code and accept resuscitative efforts but would not want to live in a vegetative state long-term.  While patient was clear with sharing his wishes during our discussion, I attempted to confirm patient's above wishes/goals with his HCPOA/son Lovena Le over the phone. No answer. HIPAA compliant VM left.   During afternoon rounds, I spoke with patient's spouse Butch Penny at bedside.  We discussed patient's current illness and what it means in the larger context of patient's on-going co-morbidities. Reviewed kidney, liver, and lung issues that are contributing to his overall prognosis.  Butch Penny shares that he was told  he had a year to live a little over a year ago.   Chart is functional and nutritional status PTA, she shares patient has been declining for several months.  She believes he is no longer a fighter and gave up his will to fight about 6 months ago.  However, she is concerned that patient's son/HCPOA is in denial about patient's current medical situation.    Family is facing treatment option decisions, advanced directive, and anticipatory care needs.    Discussed with patient/family the importance of continued conversation with family and the medical providers regarding overall plan of care and treatment options, ensuring decisions are within the context of the patient's values and GOCs.    Questions and concerns were addressed. The family was encouraged to call with questions or concerns.   PMT will continue to follow and  support patient/family holistically.   Physical Exam Vitals reviewed.  Constitutional:      General: He is not in acute distress.    Appearance: He is normal weight. He is not ill-appearing.  HENT:     Mouth/Throat:     Mouth: Mucous membranes are moist.  Eyes:     Pupils: Pupils are equal, round, and reactive to light.  Cardiovascular:     Rate and Rhythm: Normal rate.     Pulses: Normal pulses.  Pulmonary:     Effort: Pulmonary effort is normal.  Abdominal:     Palpations: Abdomen is soft.  Musculoskeletal:     Comments: MAETC, Generalized weakness  Skin:    General: Skin is warm and dry.  Neurological:     Mental Status: He is alert.     Comments: Oriented to self and place  Psychiatric:        Mood and Affect: Mood normal.        Behavior: Behavior normal.        Judgment: Judgment normal.     Palliative Assessment/Data: 50%      Time Total: 75 minutes Greater than 50%  of this time was spent counseling and coordinating care related to the above assessment and plan.  Signed by: Jordan Hawks, DNP, FNP-BC Palliative Medicine    Please contact Palliative Medicine Team phone at [phone] for questions and concerns.  For individual provider: See Shea Evans

## 2022-10-06 NOTE — Progress Notes (Signed)
Pt received in bed resting no c/os no distress noted pt stable for HS BS tx due to ICU status will use [facility]

## 2022-10-06 NOTE — Progress Notes (Signed)
[facility] NEPHROLOGY PROGRESS NOTE  Assessment/ Plan: OP HD orders:  [facility] MWF 4h  400/600  86.9kg  3K/2.5Ca bath  TDC  RIJ + LUA AVF  Heparin none - last HD 1/26, post wt 88.3kg   - soft BP's at HD in 90s for last 2 wks at minimum - venofer '50mg'$  weekly - mircera 30 mcg q4 wks, last on 1/15, due 2/12    # Septic shock from pneumonia/RML: Currently on ceftriaxone, azithromycin.  Still on Levophed, trying to titrate down in ICU.  # ESRD MWF: Receiving dialysis today.  Blood pressure holding stable on Levophed.  Lower UF goal, discussed with the dialysis nurse.  Currently has tunneled HD catheter for the access.  Next HD tomorrow.  # Acute hypoxic respiratory failure due to pneumonia: On oxygen, antibiotics.  Per pulmonary team.  # Anemia of CKD: Hemoglobin at goal.  [facility] as outpatient.  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  Currently NPO.  # Hyponatremia: Managed with HD.  UF as tolerated.  # Hypokalemia: HD with higher potassium bath..  Subjective: Seen and examined in ICU.  Receiving HD and tolerating well.  Currently on Levophed around 5 mics.  Looks more alert awake. Objective Vital signs in last 24 hours: Vitals:   10/06/22 1000 10/06/22 1015 10/06/22 1030 10/06/22 1045  BP: (S) (!) 86/53 107/64 107/62 115/66  Pulse: 69 68 69 67  Resp: 16 15 (!) 22 15  Temp:      TempSrc:      SpO2: 93% 95% 97% 98%  Weight:      Height:       Weight change:   Intake/Output Summary (Last 24 hours) at 10/06/2022 1051 Last data filed at 10/06/2022 1000 Gross per 24 hour  Intake 1848.52 ml  Output --  Net 1848.52 ml        Labs: RENAL PANEL Recent Labs    03/31/22 0710 10/03/22 1450 10/03/22 2000 10/03/22 2302 10/04/22 0712 10/05/22 0430 10/06/22 0231  NA 126* 127* 132*  --  130* 128* 127*  K 3.9 3.4* 3.7  --  3.8 3.0* 3.5  CL 92* 94*  --   --  92* 93* 91*  CO2  --  20*  --   --  '22 25 24  '$ GLUCOSE 77 113*  --   --  70 85  126*  BUN 31* 17  --   --  '17 10 16  '$ CREATININE 4.20* 4.77*  --  3.87* 4.76* 3.19* 3.86*  CALCIUM  --  8.2*  --   --  8.2* 7.9* 8.2*  MG  --   --   --  1.4* 2.1 1.8  --   PHOS  --   --   --   --  4.8*  4.7*  --   --   ALBUMIN  --  2.4*  --   --   --   --   --       Liver Function Tests: Recent Labs  Lab 10/03/22 1450  AST 34  ALT 14  ALKPHOS 88  BILITOT 2.1*  PROT 6.3*  ALBUMIN 2.4*    Recent Labs  Lab 10/03/22 1447  LIPASE 26    Recent Labs  Lab 10/03/22 1449  AMMONIA 34    CBC: Recent Labs    10/03/22 1447 10/03/22 2000 10/03/22 2302 10/04/22 0712 10/05/22 0430  HGB 12.1* 12.9* 9.5* 11.7* 10.4*  MCV 94.8  --  94.6 97.5 91.2  Cardiac Enzymes: No results for input(s): "CKTOTAL", "CKMB", "CKMBINDEX", "TROPONINI" in the last 168 hours. CBG: Recent Labs  Lab 10/05/22 1528 10/05/22 1926 10/05/22 2345 10/06/22 0344 10/06/22 0749  GLUCAP 99 96 146* 107* 75     Iron Studies: No results for input(s): "IRON", "TIBC", "TRANSFERRIN", "FERRITIN" in the last 72 hours. Studies/Results: ECHOCARDIOGRAM COMPLETE  Result Date: 10/04/2022    ECHOCARDIOGRAM REPORT   Patient Name:   DAILY CRATE Date of Exam: 10/04/2022 Medical Rec #:  [id number]       Height:       72.0 in Accession #:    [id number]      Weight:       185.0 lb Date of Birth:  12/30/1955       BSA:          2.061 m Patient Age:    67 years        BP:           88/57 mmHg Patient Gender: M               HR:           75 bpm. Exam Location:  Inpatient Procedure: 2D Echo, Cardiac Doppler, Color Doppler, Strain Analysis and 3D Echo Indications:     Q33.35 Acute systolic (congestive) heart failure  History:         Patient has prior history of Echocardiogram examinations, most                  recent 05/07/2022. ESRD; Risk Factors:Hypertension and                  Non-Smoker.  Sonographer:     Wilkie Aye RVT Referring Phys:  [id number] Chrystie Nose PAYNE Diagnosing Phys: Kirk Ruths McleanMD IMPRESSIONS  1. Left  ventricular ejection fraction, by estimation, is 40%. The left ventricle has mild to moderately decreased function. The left ventricle demonstrates global hypokinesis. Left ventricular diastolic parameters are consistent with Grade II diastolic dysfunction (pseudonormalization). The average left ventricular global longitudinal strain is -10.6 %. The global longitudinal strain is abnormal.  2. Mildly D-shaped interventricular septum suggestive of RV pressure/volume overload. Right ventricular systolic function is mildly reduced. The right ventricular size is mildly enlarged. There is mildly elevated pulmonary artery systolic pressure. The estimated right ventricular systolic pressure is 89.3 mmHg.  3. Left atrial size was moderately dilated.  4. Right atrial size was moderately dilated.  5. The mitral valve is normal in structure. Trivial mitral valve regurgitation. No evidence of mitral stenosis.  6. The aortic valve is tricuspid. There is moderate calcification of the aortic valve. Aortic valve regurgitation is trivial. Moderate aortic valve stenosis. Aortic valve area, by VTI measures 1.56 cm. Aortic valve mean gradient measures 26.0 mmHg.  7. Tricuspid valve regurgitation is moderate to severe.  8. The inferior vena cava is dilated in size with <50% respiratory variability, suggesting right atrial pressure of 15 mmHg.  9. Ascites noted. FINDINGS  Left Ventricle: Left ventricular ejection fraction, by estimation, is 40%. The left ventricle has mild to moderately decreased function. The left ventricle demonstrates global hypokinesis. The average left ventricular global longitudinal strain is -10.6  %. The global longitudinal strain is abnormal. The left ventricular internal cavity size was normal in size. There is no left ventricular hypertrophy. Left ventricular diastolic parameters are consistent with Grade II diastolic dysfunction (pseudonormalization). Right Ventricle: Mildly D-shaped interventricular septum  suggestive of RV pressure/volume overload. The right ventricular size is  mildly enlarged. No increase in right ventricular wall thickness. Right ventricular systolic function is mildly reduced. There is mildly elevated pulmonary artery systolic pressure. The tricuspid regurgitant velocity is 2.43 m/s, and with an assumed right atrial pressure of 15 mmHg, the estimated right ventricular systolic pressure is 70.9 mmHg. Left Atrium: Left atrial size was moderately dilated. Right Atrium: Right atrial size was moderately dilated. Pericardium: There is no evidence of pericardial effusion. Mitral Valve: The mitral valve is normal in structure. Trivial mitral valve regurgitation. No evidence of mitral valve stenosis. Tricuspid Valve: The tricuspid valve is normal in structure. Tricuspid valve regurgitation is moderate to severe. Aortic Valve: The aortic valve is tricuspid. There is moderate calcification of the aortic valve. Aortic valve regurgitation is trivial. Aortic regurgitation PHT measures 1354 msec. Moderate aortic stenosis is present. Aortic valve mean gradient measures  26.0 mmHg. Aortic valve peak gradient measures 37.9 mmHg. Aortic valve area, by VTI measures 1.56 cm. Pulmonic Valve: The pulmonic valve was normal in structure. Pulmonic valve regurgitation is not visualized. Aorta: The aortic root is normal in size and structure. Venous: The inferior vena cava is dilated in size with less than 50% respiratory variability, suggesting right atrial pressure of 15 mmHg. IAS/Shunts: No atrial level shunt detected by color flow Doppler.  LEFT VENTRICLE PLAX 2D LVIDd:         5.10 cm   Diastology LVIDs:         3.50 cm   LV e' medial:    6.41 cm/s LV PW:         1.20 cm   LV E/e' medial:  17.0 LV IVS:        1.10 cm   LV e' lateral:   13.60 cm/s LVOT diam:     2.20 cm   LV E/e' lateral: 8.0 LV SV:         105 LV SV Index:   51        2D Longitudinal Strain LVOT Area:     3.80 cm  2D Strain GLS Avg:     -10.6 %                            3D Volume EF:                          3D EF:        48 %                          LV EDV:       216 ml                          LV ESV:       112 ml                          LV SV:        104 ml RIGHT VENTRICLE            IVC RV Basal diam:  4.60 cm    IVC diam: 2.50 cm RV Mid diam:    4.10 cm RV S prime:     6.86 cm/s TAPSE (M-mode): 2.1 cm LEFT ATRIUM             Index  RIGHT ATRIUM           Index LA diam:        4.80 cm 2.33 cm/m   RA Area:     33.50 cm LA Vol (A2C):   92.1 ml 44.68 ml/m  RA Volume:   130.00 ml 63.07 ml/m LA Vol (A4C):   74.4 ml 36.07 ml/m LA Biplane Vol: 88.2 ml 42.79 ml/m  AORTIC VALVE                     PULMONIC VALVE AV Area (Vmax):    1.54 cm      PV Vmax:       0.89 m/s AV Area (Vmean):   1.52 cm      PV Peak grad:  3.2 mmHg AV Area (VTI):     1.56 cm AV Vmax:           308.00 cm/s AV Vmean:          244.000 cm/s AV VTI:            0.675 m AV Peak Grad:      37.9 mmHg AV Mean Grad:      26.0 mmHg LVOT Vmax:         125.00 cm/s LVOT Vmean:        97.600 cm/s LVOT VTI:          0.277 m LVOT/AV VTI ratio: 0.41 AI PHT:            1354 msec AR Vena Contracta: 0.60 cm  AORTA Ao Root diam: 3.40 cm Ao Asc diam:  3.20 cm MITRAL VALVE                TRICUSPID VALVE MV Area (PHT): 3.43 cm     TR Peak grad:   23.6 mmHg MV Decel Time: 221 msec     TR Vmax:        243.00 cm/s MV E velocity: 109.00 cm/s MV A velocity: 39.00 cm/s   SHUNTS MV E/A ratio:  2.79         Systemic VTI:  0.28 m                             Systemic Diam: 2.20 cm Dalton McleanMD Electronically signed by Franki Monte Signature Date/Time: 10/04/2022/4:13:32 PM    Final (Updated)    DG CHEST PORT 1 VIEW  Result Date: 10/04/2022 CLINICAL DATA:  Central line placement EXAM: PORTABLE CHEST 1 VIEW COMPARISON:  Chest radiograph 10/03/2022 FINDINGS: Dual lumen central venous catheter tip projects over the right atrium, stable. Interval insertion left IJ central venous catheter with tip projecting  at the central left brachiocephalic vein. Stable cardiomegaly. Low lung volumes. Basilar heterogeneous opacities. No pleural effusion or pneumothorax. Multiple old right rib fractures. IMPRESSION: 1. Interval insertion left IJ central venous catheter with tip projecting at the central left brachiocephalic vein. 2. Low lung volumes with basilar atelectasis. Electronically Signed   By: Lovey Newcomer M.D.   On: 10/04/2022 12:19    Medications: Infusions:  sodium chloride Stopped (10/03/22 1919)   sodium chloride 10 mL/hr at 10/06/22 1000   anticoagulant sodium citrate     cefTRIAXone (ROCEPHIN)  IV Stopped (10/05/22 2347)   dextrose 25 mL/hr at 10/06/22 1000   norepinephrine (LEVOPHED) Adult infusion 5 mcg/min (10/06/22 1000)    Scheduled Medications:  arformoterol  15 mcg Nebulization BID   azithromycin  500  mg Oral Daily   budesonide (PULMICORT) nebulizer solution  0.5 mg Nebulization BID   Chlorhexidine Gluconate Cloth  6 each Topical Q0600   Chlorhexidine Gluconate Cloth  6 each Topical [id number]   folic acid  1 mg Oral Daily   heparin  5,000 Units Subcutaneous Q8H   midodrine  20 mg Oral Q8H   multivitamin with minerals  1 tablet Oral Daily   mupirocin ointment  1 Application Nasal BID   mouth rinse  15 mL Mouth Rinse 4 times per day   sodium chloride flush  10-40 mL Intracatheter Q12H   thiamine  100 mg Oral Daily    have reviewed scheduled and prn medications.  Physical Exam: General:NAD, comfortable, more alert and awake. Heart:RRR, s1s2 nl Lungs: Basal rhonchi, no increased work of breathing Abdomen:soft, Non-tender, non-distended Extremities: Trace peripheral  edema Dialysis Access: RIJ TDC in place.  Cambrea Kirt Prasad Larine Fielding 10/06/2022,10:51 AM  LOS: 3 days

## 2022-10-07 LAB — GLUCOSE, CAPILLARY
Glucose-Capillary: 90 mg/dL (ref 70–99)
Glucose-Capillary: 79 mg/dL (ref 70–99)
Glucose-Capillary: 131 mg/dL — ABNORMAL HIGH (ref 70–99)
Glucose-Capillary: 112 mg/dL — ABNORMAL HIGH (ref 70–99)
Glucose-Capillary: 103 mg/dL — ABNORMAL HIGH (ref 70–99)
Glucose-Capillary: 90 mg/dL (ref 70–99)

## 2022-10-07 LAB — CORTISOL: Cortisol, Plasma: 8.2 ug/dL

## 2022-10-07 LAB — BODY FLUID CULTURE W GRAM STAIN: Culture: NO GROWTH

## 2022-10-07 MED ORDER — CHLORHEXIDINE GLUCONATE CLOTH 2 % EX PADS
6.0000 | MEDICATED_PAD | Freq: Every day | CUTANEOUS | Status: DC
  Administered 2022-10-07 – 2022-10-10 (×4): 6 via TOPICAL

## 2022-10-07 NOTE — Progress Notes (Signed)
Palliative Care Progress Note, Assessment & Plan   Patient Name: Benjamin [name]       Date: 10/07/2022 DOB: 07-11-1956  Age: 67 y.o. MRN#: [id number] Attending Physician: Juanito Doom, MD Primary Care Physician: Mateo Flow, MD Admit Date: 10/03/2022  Subjective: Pt is OOB and sitting in recliner at bedside. No acute complaints. No family present at bedside.   HPI: 67 y.o. male  with past medical history of EtOH abuse, cirrhosis, ESRD (MWF-[place]), advanced heart failure (EF 20%), and type 2 diabetes admitted on 10/03/2022 with AMS.   Patient is being treated for septic shock related to R ML space CAP (ceftriaxone and Zithromax), ARF, hypokalemia, and hypoglycemia.  Patient was receiving Levophed gtt but has been off of pressors since 0500.  Summary of counseling/coordination of care: After reviewing the patient's chart and assessing the patient at bedside, I spoke with patient in regards to diagnosis and plan of care.   When asked his current understanding of his medical situation, he asked me what the bed was for.   When asked what the doctors have shared with him about his medical situation thus far, he shared he is suppose to keep a good blood pressure and be able to go home tomorrow.   After speaking with the patient and determining that he cannot participate in [place] discussions independently, I spoke with his son Lovena Le over the phone.  Brief medical update given.  Education provided on septic shock, ESRD, cirrhosis, and pneumonia.    I highlighted the palliative medicine is a specialized medical care for people living with serious and chronic illnesses.  Reviewed that PMT is like a guide on the side to support patient through medical decision making.  We are support for both patient and  family.  I conveyed discussions that I had with patient yesterday.  Specifically, outlined patient's discussion of resuscitative measures and life-sustaining interventions.  Lovena Le was in agreement that patient would want all appropriate, offered, and available interventions to sustain his life if there is a chance that he can survive.  We discussed patient also wishes to not be kept alive on machines if he does not have a chance that a meaningful recovery.  Lovena Le also was in agreement with this statement.  Full code and full scope remain.  Lovena Le plans to visit the hospital on Saturday.  I am on service and plan to touch base with Lovena Le at that time to continue goals of care discussions.  Physical Exam Vitals reviewed.  Constitutional:      General: He is not in acute distress.    Appearance: He is not ill-appearing.  HENT:     Head: Normocephalic.     Mouth/Throat:     Mouth: Mucous membranes are moist.  Eyes:     Pupils: Pupils are equal, round, and reactive to light.  Cardiovascular:     Rate and Rhythm: Normal rate.     Pulses: Normal pulses.  Pulmonary:     Effort: Pulmonary effort is normal.  Abdominal:     Palpations: Abdomen is soft.  Musculoskeletal:        General: Normal range of motion.     Comments: MAETC  Neurological:  Mental Status: He is alert and oriented to person, place, and time.  Psychiatric:        Mood and Affect: Mood normal.        Behavior: Behavior normal.        Judgment: Judgment normal.             Total Time 50 minutes   Vernica Wachtel L. Ilsa Iha, FNP-BC Palliative Medicine Team Team Phone # [phone]

## 2022-10-07 NOTE — Progress Notes (Signed)
NAME:  Benjamin [name], MRN:  [id number], DOB:  1956/07/22, LOS: 4 ADMISSION DATE:  10/03/2022, CONSULTATION DATE:  1/28 REFERRING MD:  Pearline Cables, CHIEF COMPLAINT:  Septic shock   History of Present Illness:  67 y/o male with ESRD and cirrhosis admitted 1/28 with RML CAP & subsequent septic shock.  Pertinent  Medical History  ESRD with HD MWF Cirrhosis due to cirrhosis HFrEF DM2 EtOH abuse  Significant Hospital Events: Including procedures, antibiotic start and stop dates in addition to other pertinent events   1/28 septic shock from CAP, on levophed 1/29 HD: 1 liter removed; TTE: LVEF 30%, grade 2 diastolic dysfunction; RV function mildly reduced, RV size mildly enlarged PASP estimate 38.6 mmHg, LAE, RAE 1/30 briefly off levophed during daytime, walked with PT with a walker, HD  Interim History / Subjective:  Afebrile  Pt denies acute complaints. Reports he ate most all of his breakfast except two bites of toast  Off levophed since 0500, BP stable  Son at bedside   Objective   Blood pressure 127/70, pulse 61, temperature (!) 96.2 F (35.7 C), temperature source Axillary, resp. rate 19, height 6' (1.829 m), weight 92.9 kg, SpO2 98 %. CVP:  [20 mmHg-25 mmHg] 20 mmHg      Intake/Output Summary (Last 24 hours) at 10/07/2022 1123 Last data filed at 10/07/2022 0923 Gross per 24 hour  Intake 1069.18 ml  Output 2000 ml  Net -930.82 ml   Filed Weights   10/04/22 1952 10/04/22 2345 10/07/22 0500  Weight: 90 kg 89.9 kg 92.9 kg    Examination: General: chronically ill appearing adult male sitting up in bed in NAD  HEENT: MM pink/moist, pupils =/reactive Neuro: Awake, alert, oriented to self, place, difficulty remembering details about his care CV: s1s2 RRR, SB on monitor, SEM murmur  PULM: non-labored at rest, lungs bilaterally with occasional sibilant wheeze GI: soft, bsx4 active  Extremities: warm/dry, trace BLE edema, discoloration of LE's  Skin: paper thin skin with multiple  bruises, BUE's wrapped with gauze   BCx2 1/28 >> negative  BCx2 1/28 >>   Resolved Hospital Problem list     Assessment & Plan:   Septic Shock from CAP in RML, NOS -continue ceftriaxone, azithro - stop date in place  -follow up 1/28 blood cultures -remains off levophed, plan for transfer out of ICU this afternoon   Acute Respiratory Failure with Hypoxemia from Pneumonia Asthma -continue pulmicort / brovana  -pulmonary hygiene - IS, mobilize as able  -follow intermittent CXR   ESRD Hypokalemia Hyponatremia HD per renal, MWF -renal dose medications -Trend BMP / urinary output -Replace electrolytes as indicated -Avoid nephrotoxic agents, ensure adequate renal perfusion  Alcoholic Cirrhosis with Ascites  Does not have SBP based on peritoneal fluid analysis -follow clinical exam  -intermittent LFT's  -avoid hepatotoxic agents  Hypoglycemia due to Cirrhosis DM2 -D10 infusion until PO intake more consistent  -glucose range 79-131 on D10  HFrEF; 1/29 LVEF 40%; not in cardiogenic shock Hypertension -tele monitoring  -hold home antihypertensives  Anemia of Chronic Disease Thrombocytopenia > improving -follow CBC, platelets improving  -transfuse for Hgb <7% -no indication for platelet transfusion  Concern for norepinephrine extravasation arm 1/29 -wound care   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   From 7A-7P if no  response, please call [phone] After hours, please call ELink [phone]

## 2022-10-07 NOTE — Progress Notes (Signed)
[facility] NEPHROLOGY PROGRESS NOTE  Assessment/ Plan: OP HD orders:  [place] MWF 4h  400/600  86.9kg  3K/2.5Ca bath  TDC  RIJ + LUA AVF  Heparin none - last HD 1/26, post wt 88.3kg   - soft BP's at HD in 90s for last 2 wks at minimum - venofer '50mg'$  weekly - mircera 30 mcg q4 wks, last on 1/15, due 2/12    # Septic shock from pneumonia/RML: Currently on ceftriaxone, azithromycin.  Off of Levophed now.  Clinically much better.    # ESRD MWF: Status post HD yesterday with 2 L UF, tolerated well.  Off of pressors and BP acceptable.  Plan for regular dialysis tomorrow.  Currently has tunneled HD catheter for the access.  The dialysis can be done outpatient if he is discharged today.  # Acute hypoxic respiratory failure due to pneumonia: Off of oxygen this morning, on antibiotics.  Per pulmonary team.   # Anemia of CKD: Hemoglobin at goal.  [facility] as outpatient.  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    # Hyponatremia: Managed with HD.  UF as tolerated.  # Hypokalemia: HD with higher potassium bath..  Subjective: Seen and examined in ICU.  Clinically much better.  He is off of Levophed.  Blood pressure acceptable.  He is asking when he can go home.  Denies nausea, vomiting, chest pain, shortness of breath.  Objective Vital signs in last 24 hours: Vitals:   10/07/22 0530 10/07/22 0749 10/07/22 0810 10/07/22 0813  BP: 112/61     Pulse: 66     Resp: 14     Temp:  (!) 96.2 F (35.7 C)    TempSrc:  Axillary    SpO2: 99%  99% 100%  Weight:      Height:       Weight change:   Intake/Output Summary (Last 24 hours) at 10/07/2022 1001 Last data filed at 10/07/2022 0925 Gross per 24 hour  Intake 1109.74 ml  Output 2000 ml  Net -890.26 ml        Labs: RENAL PANEL Recent Labs    03/31/22 0710 10/03/22 1450 10/03/22 2000 10/03/22 2302 10/04/22 0712 10/05/22 0430 10/06/22 0231  NA 126* 127* 132*  --  130* 128* 127*  K 3.9 3.4* 3.7  --   3.8 3.0* 3.5  CL 92* 94*  --   --  92* 93* 91*  CO2  --  20*  --   --  '22 25 24  '$ GLUCOSE 77 113*  --   --  70 85 126*  BUN 31* 17  --   --  '17 10 16  '$ CREATININE 4.20* 4.77*  --  3.87* 4.76* 3.19* 3.86*  CALCIUM  --  8.2*  --   --  8.2* 7.9* 8.2*  MG  --   --   --  1.4* 2.1 1.8  --   PHOS  --   --   --   --  4.8*  4.7*  --   --   ALBUMIN  --  2.4*  --   --   --   --   --       Liver Function Tests: Recent Labs  Lab 10/03/22 1450  AST 34  ALT 14  ALKPHOS 88  BILITOT 2.1*  PROT 6.3*  ALBUMIN 2.4*    Recent Labs  Lab 10/03/22 1447  LIPASE 26    Recent Labs  Lab 10/03/22 1449  AMMONIA 34  CBC: Recent Labs    10/03/22 1447 10/03/22 2000 10/03/22 2302 10/04/22 0712 10/05/22 0430  HGB 12.1* 12.9* 9.5* 11.7* 10.4*  MCV 94.8  --  94.6 97.5 91.2     Cardiac Enzymes: No results for input(s): "CKTOTAL", "CKMB", "CKMBINDEX", "TROPONINI" in the last 168 hours. CBG: Recent Labs  Lab 10/06/22 1514 10/06/22 1913 10/06/22 2317 10/07/22 0317 10/07/22 0744  GLUCAP 137* 122* 100* 90 79     Iron Studies: No results for input(s): "IRON", "TIBC", "TRANSFERRIN", "FERRITIN" in the last 72 hours. Studies/Results: No results found.  Medications: Infusions:  sodium chloride Stopped (10/03/22 1919)   sodium chloride 10 mL/hr at 10/07/22 0500   anticoagulant sodium citrate     cefTRIAXone (ROCEPHIN)  IV Stopped (10/07/22 0119)   dextrose Stopped (10/06/22 1325)   norepinephrine (LEVOPHED) Adult infusion Stopped (10/07/22 0454)    Scheduled Medications:  arformoterol  15 mcg Nebulization BID   azithromycin  500 mg Oral Daily   budesonide (PULMICORT) nebulizer solution  0.5 mg Nebulization BID   Chlorhexidine Gluconate Cloth  6 each Topical Q0600   Chlorhexidine Gluconate Cloth  6 each Topical M2111   folic acid  1 mg Oral Daily   heparin  5,000 Units Subcutaneous Q8H   midodrine  20 mg Oral Q8H   multivitamin with minerals  1 tablet Oral Daily   mupirocin  ointment  1 Application Nasal BID   mouth rinse  15 mL Mouth Rinse 4 times per day   sodium chloride flush  10-40 mL Intracatheter Q12H   thiamine  100 mg Oral Daily    have reviewed scheduled and prn medications.  Physical Exam: General: Alert awake and comfortable.Marland Kitchen Heart:RRR, s1s2 nl Lungs: Basal rhonchi, no increased work of breathing Abdomen:soft, Non-tender, non-distended Extremities: No edema. Dialysis Access: RIJ TDC in place.  Harry Bark Prasad Daniel Johndrow 10/07/2022,10:01 AM  LOS: 4 days

## 2022-10-07 NOTE — Progress Notes (Signed)
Physical Therapy Treatment Patient Details Name: RAYDAN SCHLABACH MRN: [id number] DOB: 1956-01-30 Today's Date: 10/07/2022   History of Present Illness 67 yo male admitted 1/28 with AMS and hypoglycemia. Moderate ascites with diagnostic paracentesis in ED. 1/29 RUE infiltration. PMHx: T2DM, ESRD on HD MWF, HFrEF, cirrhosis    PT Comments    Pt pleasant, HOH and demonstrates increased activity tolerance this session. Pt able to maintain SPO2 >92% on  RA throughout session. Pt educated for increased transfers, gait and HEP with encouragement to maximize gait and function at home as son present reports relatively sedentary lifestyle.   Supine 100/58 Sitting after gait 93/75 HR 66 SPO2 92-99% on RA  Recommendations for follow up therapy are one component of a multi-disciplinary discharge planning process, led by the attending physician.  Recommendations may be updated based on patient status, additional functional criteria and insurance authorization.  Follow Up Recommendations  Home health PT     Assistance Recommended at Discharge Intermittent Supervision/Assistance  Patient can return home with the following A little help with bathing/dressing/bathroom;A little help with walking and/or transfers;Assistance with cooking/housework;Direct supervision/assist for medications management;Assist for transportation   Equipment Recommendations  Rolling walker (2 wheels)    Recommendations for Other Services       Precautions / Restrictions Precautions Precautions: Fall;Other (comment) Precaution Comments: watch BP; fragile skin     Mobility  Bed Mobility Overal bed mobility: Needs Assistance Bed Mobility: Supine to Sit     Supine to sit: Min guard     General bed mobility comments: cues for sequence with guarding for lines and safety    Transfers Overall transfer level: Needs assistance   Transfers: Sit to/from Stand Sit to Stand: Min assist           General transfer  comment: cues for hand placement and to back fully to surface to sit    Ambulation/Gait Ambulation/Gait assistance: Min assist Gait Distance (Feet): 150 Feet Assistive device: Rolling walker (2 wheels) Gait Pattern/deviations: Step-through pattern, Decreased stride length, Trunk flexed   Gait velocity interpretation: 1.31 - 2.62 ft/sec, indicative of limited community ambulator   General Gait Details: min assist to direct and control RW, cues for posture and stepping into RW   Stairs             Wheelchair Mobility    Modified Rankin (Stroke Patients Only)       Balance Overall balance assessment: Needs assistance Sitting-balance support: No upper extremity supported, Feet supported Sitting balance-Leahy Scale: Fair     Standing balance support: Bilateral upper extremity supported, Reliant on assistive device for balance Standing balance-Leahy Scale: Poor Standing balance comment: bil UE on RW in standing                            Cognition Arousal/Alertness: Awake/alert Behavior During Therapy: WFL for tasks assessed/performed, Flat affect Overall Cognitive Status: Impaired/Different from baseline Area of Impairment: Safety/judgement, Problem solving, Attention, Awareness, Orientation, Memory                 Orientation Level: Disoriented to, Time Current Attention Level: Selective Memory: Decreased short-term memory   Safety/Judgement: Decreased awareness of safety, Decreased awareness of deficits   Problem Solving: Slow processing, Requires verbal cues General Comments: pt unable to find utensils on tray. Pt does not walk far at home and has not cared for horses for a few years per son despite pt's report.  Exercises General Exercises - Lower Extremity Long Arc Quad: AROM, 10 reps, Both, Seated Hip Flexion/Marching: AROM, Both, 10 reps, Seated    General Comments        Pertinent Vitals/Pain Pain Assessment Pain  Assessment: No/denies pain    Home Living                          Prior Function            PT Goals (current goals can now be found in the care plan section) Progress towards PT goals: Progressing toward goals    Frequency    Min 3X/week      PT Plan Current plan remains appropriate    Co-evaluation              AM-PAC PT "6 Clicks" Mobility   Outcome Measure  Help needed turning from your back to your side while in a flat bed without using bedrails?: A Little Help needed moving from lying on your back to sitting on the side of a flat bed without using bedrails?: A Little Help needed moving to and from a bed to a chair (including a wheelchair)?: A Little Help needed standing up from a chair using your arms (e.g., wheelchair or bedside chair)?: A Little Help needed to walk in hospital room?: A Little Help needed climbing 3-5 steps with a railing? : Total 6 Click Score: 16    End of Session Equipment Utilized During Treatment: Gait belt Activity Tolerance: Patient tolerated treatment well Patient left: in chair;with call bell/phone within reach;with chair alarm set;with family/visitor present Nurse Communication: Mobility status PT Visit Diagnosis: Other abnormalities of gait and mobility (R26.89);Muscle weakness (generalized) (M62.81)     Time: 9629-5284 PT Time Calculation (min) (ACUTE ONLY): 33 min  Charges:  $Gait Training: 8-22 mins $Therapeutic Activity: 8-22 mins                     Bayard Males, PT Acute Rehabilitation Services Office: [place] 10/07/2022, 1:02 PM

## 2022-10-08 ENCOUNTER — Inpatient Hospital Stay (HOSPITAL_COMMUNITY): Payer: Medicare Other

## 2022-10-08 LAB — CBC
HCT: 31.6 % — ABNORMAL LOW (ref 39.0–52.0)
HCT: 33.9 % — ABNORMAL LOW (ref 39.0–52.0)
Hemoglobin: 10.5 g/dL — ABNORMAL LOW (ref 13.0–17.0)
Hemoglobin: 10.9 g/dL — ABNORMAL LOW (ref 13.0–17.0)
MCH: 29.8 pg (ref 26.0–34.0)
MCH: 29.7 pg (ref 26.0–34.0)
MCHC: 33.2 g/dL (ref 30.0–36.0)
MCHC: 32.2 g/dL (ref 30.0–36.0)
MCV: 89.8 fL (ref 80.0–100.0)
MCV: 92.4 fL (ref 80.0–100.0)
Platelets: 57 10*3/uL — ABNORMAL LOW (ref 150–400)
Platelets: 63 10*3/uL — ABNORMAL LOW (ref 150–400)
RBC: 3.52 MIL/uL — ABNORMAL LOW (ref 4.22–5.81)
RBC: 3.67 MIL/uL — ABNORMAL LOW (ref 4.22–5.81)
RDW: 17.5 % — ABNORMAL HIGH (ref 11.5–15.5)
RDW: 17.4 % — ABNORMAL HIGH (ref 11.5–15.5)
WBC: 6.4 10*3/uL (ref 4.0–10.5)
WBC: 6.4 10*3/uL (ref 4.0–10.5)
nRBC: 0 % (ref 0.0–0.2)
nRBC: 0 % (ref 0.0–0.2)

## 2022-10-08 LAB — BASIC METABOLIC PANEL
Anion gap: 9 (ref 5–15)
BUN: 12 mg/dL (ref 8–23)
CO2: 19 mmol/L — ABNORMAL LOW (ref 22–32)
Calcium: 6.7 mg/dL — ABNORMAL LOW (ref 8.9–10.3)
Chloride: 102 mmol/L (ref 98–111)
Creatinine, Ser: 2.94 mg/dL — ABNORMAL HIGH (ref 0.61–1.24)
GFR, Estimated: 23 mL/min — ABNORMAL LOW (ref >60)
Glucose, Bld: 61 mg/dL — ABNORMAL LOW (ref 70–99)
Potassium: 3 mmol/L — ABNORMAL LOW (ref 3.5–5.1)
Sodium: 130 mmol/L — ABNORMAL LOW (ref 135–145)

## 2022-10-08 LAB — CULTURE, BLOOD (ROUTINE X 2)
Culture: NO GROWTH
Culture: NO GROWTH

## 2022-10-08 LAB — RENAL FUNCTION PANEL
Albumin: 2.7 g/dL — ABNORMAL LOW (ref 3.5–5.0)
Anion gap: 14 (ref 5–15)
BUN: 16 mg/dL (ref 8–23)
CO2: 21 mmol/L — ABNORMAL LOW (ref 22–32)
Calcium: 8.7 mg/dL — ABNORMAL LOW (ref 8.9–10.3)
Chloride: 92 mmol/L — ABNORMAL LOW (ref 98–111)
Creatinine, Ser: 3.99 mg/dL — ABNORMAL HIGH (ref 0.61–1.24)
GFR, Estimated: 16 mL/min — ABNORMAL LOW (ref >60)
Glucose, Bld: 64 mg/dL — ABNORMAL LOW (ref 70–99)
Phosphorus: 2.5 mg/dL (ref 2.5–4.6)
Potassium: 4.1 mmol/L (ref 3.5–5.1)
Sodium: 127 mmol/L — ABNORMAL LOW (ref 135–145)

## 2022-10-08 LAB — GLUCOSE, CAPILLARY
Glucose-Capillary: 74 mg/dL (ref 70–99)
Glucose-Capillary: 62 mg/dL — ABNORMAL LOW (ref 70–99)
Glucose-Capillary: 117 mg/dL — ABNORMAL HIGH (ref 70–99)
Glucose-Capillary: 91 mg/dL (ref 70–99)
Glucose-Capillary: 66 mg/dL — ABNORMAL LOW (ref 70–99)
Glucose-Capillary: 65 mg/dL — ABNORMAL LOW (ref 70–99)
Glucose-Capillary: 110 mg/dL — ABNORMAL HIGH (ref 70–99)
Glucose-Capillary: 73 mg/dL (ref 70–99)
Glucose-Capillary: 72 mg/dL (ref 70–99)

## 2022-10-08 MED ORDER — DEXTROSE 50 % IV SOLN
INTRAVENOUS | Status: AC
  Administered 2022-10-08: 12.5 mL
  Filled 2022-10-08: qty 50

## 2022-10-08 MED ORDER — GLUCOSE 40 % PO GEL: 1.0000 | ORAL | Status: DC

## 2022-10-08 MED ORDER — ALTEPLASE 2 MG IJ SOLR: 2.0000 mg | Freq: Once | INTRAMUSCULAR | Status: DC | PRN

## 2022-10-08 MED ORDER — ANTICOAGULANT SODIUM CITRATE 4% (200MG/5ML) IV SOLN: 5.0000 mL | Status: DC | PRN

## 2022-10-08 MED ORDER — DEXTROSE 50 % IV SOLN
12.5000 g | INTRAVENOUS | Status: AC
  Administered 2022-10-08: 12.5 g via INTRAVENOUS

## 2022-10-08 MED ORDER — DEXTROSE 5 % IV SOLN: INTRAVENOUS | Status: DC

## 2022-10-08 MED ORDER — LIDOCAINE-PRILOCAINE 2.5-2.5 % EX CREA: 1.0000 | TOPICAL_CREAM | CUTANEOUS | Status: DC | PRN

## 2022-10-08 MED ORDER — LIDOCAINE HCL (PF) 1 % IJ SOLN: 5.0000 mL | INTRAMUSCULAR | Status: DC | PRN

## 2022-10-08 MED ORDER — DEXTROSE 50 % IV SOLN
INTRAVENOUS | Status: AC
  Filled 2022-10-08: qty 50

## 2022-10-08 MED ORDER — HEPARIN SODIUM (PORCINE) 1000 UNIT/ML DIALYSIS
1000.0000 [IU] | INTRAMUSCULAR | Status: DC | PRN
  Filled 2022-10-08: qty 1

## 2022-10-08 MED ORDER — GLUCOSE 40 % PO GEL
ORAL | Status: AC
  Filled 2022-10-08: qty 1.21

## 2022-10-08 MED ORDER — DEXTROSE 10 % IV SOLN: INTRAVENOUS | Status: DC

## 2022-10-08 MED ORDER — PENTAFLUOROPROP-TETRAFLUOROETH EX AERO: 1.0000 | INHALATION_SPRAY | CUTANEOUS | Status: DC | PRN

## 2022-10-08 NOTE — Progress Notes (Signed)
Received patient in bed to unit.  Alert, disoriented with time place and situation. Informed consent signed and in chart.   TX duration: 4 hours  Patient tolerated well.  Transported back to the room  Alert, verbally aggressive Hand-off given to patient's nurse.   Access used: dialysis cath Access issues: none  Total UF removed: 2100 Medication(s) given: none Post HD VS: see table below Post HD weight: 93.7 kg bed scale   [name]

## 2022-10-08 NOTE — Progress Notes (Signed)
PROGRESS NOTE Benjamin [name]  ZOX:[id number] DOB: 10/29/1955 DOA: 10/03/2022 PCP: Mateo Flow, MD   Brief Narrative/Hospital Course: 17yom w/ complex medical history with ESRD on HD MWF, chronic systolic CHF with EF 81%, T2DM, previous alcohol abuse with cirrhosis presented to the ED with altered mental status slurred speech on 1/28.  Wife reported he has been having bruising productive cough yellow phlegm past day history placed in 1/26 but did not finish due to shortness of breath increasingly became altered and came to the ED In the ED patient is confused hypoglycemic in the 40s hypoxic in 80s SBP in 70s-80s hypothermic: Felt to be in septic shock cultures obtained and started on IV antibiotics, pressors, 1.5L of IV fluids and albumin. CT chest/abdomen/pelvis showing mild patchy opacities in RML; cirrhosis with moderate ascites.  Diagnostic paracentesis performed by ED physician.  Labs with thrombocytopenia elevated troponin MNF 34 alcohol level normal leukocytosis 13.5 hemoglobin 12.1 lactic acid 3.4-3 0.6-3.2 BNP more than 4500, COVID/flu/RSV negative, VBG 7.24, 53, 40, 23. Patient was admitted to ICU seen by nephrology underwent HD 1/29 1 L removed, echo showed EF 40% G2 DD.  1/3o briefly off Levophed during daytime.  No SBP on peritoneal fluid analysis. He has been off pressors and subsequently transferred to [facility] service 2/2.    Subjective: Seen and examined Earlier more lethargic cbg in 60s-got dextrose- sugar came up- now more alert awake but still confused only oriented to self. Wanting to sit on chair RN at bedside He is able to move all his extremities  Assessment and Plan: Principal Problem:   Septic shock ([place]) Active Problems:   Alcoholic cirrhosis of liver with ascites ([place])   Acute respiratory failure with hypoxia ([place])   ESRD on dialysis ([place])   Thrombocytopenia ([place])   Septic shock from CAP: Off pressors, hemodynamically stable.  Blood cultures so far no growth.   Peritoneal fluid culture no growth, respiratory virus panel negative continue midodrine.  Currently on azithromycin.  Completed ceftriaxone.  Acute respiratory failure with hypoxia Asthma: Respiratory failure due to pneumonia, continue incentive spirometry, supplemental oxygen, intermittent chest x-ray if needed.  Continue Pulmicort/Brov[place], ambulation PT OT  ESRD on HD MWF Hypokalemia Hyponatremia Metabolic acidosis: Nephrology following closely continue HD per nephrology.  Monitor electrolytes, trend urine output/BMP and avoid nephrotoxic agents. Recent Labs  Lab 10/03/22 1450 10/03/22 2000 10/03/22 2302 10/04/22 0712 10/05/22 0430 10/06/22 0231 10/08/22 0417  NA 127* 132*  --  130* 128* 127* 130*  K 3.4* 3.7  --  3.8 3.0* 3.5 3.0*  CL 94*  --   --  92* 93* 91* 102  CO2 20*  --   --  '22 25 24 '$ 19*  GLUCOSE 113*  --   --  70 85 126* 61*  BUN 17  --   --  '17 10 16 12  '$ CREATININE 4.77*  --  3.87* 4.76* 3.19* 3.86* 2.94*  CALCIUM 8.2*  --   --  8.2* 7.9* 8.2* 6.7*  MG  --   --  1.4* 2.1 1.8  --   --   PHOS  --   --   --  4.8*  4.7*  --   --   --     Type 2 diabetes with this with hypoglycemia Hypoglycemia blood sugar range 62 2/2 am: Encourage oral intake.  Initially on D10 infusion> resume if persistent hypoglycemia.  Due to persistent hypoglycemia will check am serum cortisol level> if abnormal/he will nee need cosyntropin test.  Start iv dextrose D5W .  Acute metabolic encephalopathy: has been confused> more confused today per RN-?speech slurring>cbg was in 66 got dextrose half amp. More alert, able to move all hsi extremities, no facial droop. Presented with hypoglycemia altered mental status/speech slurring in ED- has been in and out of confusion but was more oriented yesterday.  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    HFrEF EF 40% 1/29: Address  fluid management with dialysis.  GDMT limited due to patient's hypotension Hypotension: On midodrine.  Holding antihypertensive  Anemia of chronic renal disease Thrombocytopenia in the setting of cirrhosis: Monitor patient CBC closely transfuse for platelet less than 10K or hemoglobin less than 7. Recent Labs  Lab 10/04/22 0712 10/05/22 0430 10/08/22 0417  HGB 11.7* 10.4* 10.5*  HCT 38.6* 32.0* 31.6*  WBC 19.3* 11.4* 6.4  PLT 75* 82* 57*     Concern for Levophed extravasation 1/29 continue wound care elevate-per pulmonary critical  Goals of care: Palliative care following closely currently full code Foley is cotreatment but overall improved she does not appear bright  DVT prophylaxis: heparin injection 5,000 Units Start: 10/03/22 2200 Code Status:   Code Status: Full Code Family Communication: plan of care discussed with patient/no family at bedside. Patient status is: inpatient  because of sepsis confusion Level of care: Progressive   Dispo: The patient is from: home            Anticipated disposition: TBD Objective: Vitals last 24 hrs: Vitals:   10/08/22 1121 10/08/22 1126 10/08/22 1200 10/08/22 1230  BP: (!) 114/58  (!) 100/46 134/86  Pulse: 68  (!) 59 67  Resp: '15  17 19  '$ Temp:  (!) 95.8 F (35.4 C)  (!) 97.4 F (36.3 C)  TempSrc:  Axillary  Axillary  SpO2: 98%  95% 95%  Weight:      Height:       Weight change: 3.1 kg  Physical Examination: General exam: alert awake, ORIENTED X1-2, older than stated age HEENT:Oral mucosa moist, Ear/Nose WNL grossly Respiratory system: bilaterally clear BS, on RA, no use of accessory muscle Cardiovascular system: S1 & S2 +, No JVD. Gastrointestinal system: Abdomen soft,NT,ND, BS+ Nervous System:Alert, awake,oriented x1-2, moving all 4 extremities well, no facil droop or focal weakness. Extremities: LE edema MILD,distal peripheral pulses palpable.  Skin: No rashes,no icterus. MSK: Normal muscle bulk,tone,  power.  Medications reviewed:  Scheduled Meds:  arformoterol  15 mcg Nebulization BID   budesonide (PULMICORT) nebulizer solution  0.5 mg Nebulization BID   Chlorhexidine Gluconate Cloth  6 each Topical Q0600   Chlorhexidine Gluconate Cloth  6 each Topical Q0600   Chlorhexidine Gluconate Cloth  6 each Topical Q0600   dextrose       dextrose       folic acid  1 mg Oral Daily   heparin  5,000 Units Subcutaneous Q8H   midodrine  20 mg Oral Q8H   multivitamin with minerals  1 tablet Oral Daily   mupirocin ointment  1 Application Nasal BID   mouth rinse  15 mL Mouth Rinse 4 times per day   sodium chloride flush  10-40 mL Intracatheter Q12H   thiamine  100 mg Oral Daily   Continuous Infusions:  sodium chloride 250 mL (10/07/22 2323)   anticoagulant sodium citrate     anticoagulant sodium citrate     dextrose      Diet  Order             Diet Heart Room service appropriate? Yes; Fluid consistency: Thin; Fluid restriction: 1200 mL Fluid  Diet effective now                  Intake/Output Summary (Last 24 hours) at 10/08/2022 1257 Last data filed at 10/07/2022 2000 Gross per 24 hour  Intake 54.25 ml  Output --  Net 54.25 ml   Net IO Since Admission: 5,686.49 mL [10/08/22 1257]  Wt Readings from Last 3 Encounters:  10/08/22 96 kg  03/31/22 88.5 kg  03/26/22 88.5 kg     Unresulted Labs (From admission, onward)     Start     Ordered   10/09/22 0500  Cortisol  Tomorrow morning,   R       Question:  Specimen collection method  Answer:  Unit=Unit collect   10/08/22 0812   10/08/22 1040  Renal function panel  Once,   R       Question:  Specimen collection method  Answer:  Unit=Unit collect   10/08/22 1039   10/08/22 1040  CBC  Once,   R       Question:  Specimen collection method  Answer:  Unit=Unit collect   10/08/22 1039   10/03/22 2132  Expectorated Sputum Assessment w Gram Stain, Rflx to Resp Cult  Once,   R        10/03/22 2131          Data Reviewed: I have  personally reviewed following labs and imaging studies CBC: Recent Labs  Lab 10/03/22 1447 10/03/22 2000 10/03/22 2302 10/04/22 0712 10/05/22 0430 10/08/22 0417  WBC 13.5*  --  13.5* 19.3* 11.4* 6.4  NEUTROABS 12.0*  --   --   --   --   --   HGB 12.1* 12.9* 9.5* 11.7* 10.4* 10.5*  HCT 38.6* 38.0* 29.8* 38.6* 32.0* 31.6*  MCV 94.8  --  94.6 97.5 91.2 89.8  PLT 66*  --  64* 75* 82* 57*   Basic Metabolic Panel: Recent Labs  Lab 10/03/22 1450 10/03/22 2000 10/03/22 2302 10/04/22 0712 10/05/22 0430 10/06/22 0231 10/08/22 0417  NA 127* 132*  --  130* 128* 127* 130*  K 3.4* 3.7  --  3.8 3.0* 3.5 3.0*  CL 94*  --   --  92* 93* 91* 102  CO2 20*  --   --  '22 25 24 '$ 19*  GLUCOSE 113*  --   --  70 85 126* 61*  BUN 17  --   --  '17 10 16 12  '$ CREATININE 4.77*  --  3.87* 4.76* 3.19* 3.86* 2.94*  CALCIUM 8.2*  --   --  8.2* 7.9* 8.2* 6.7*  MG  --   --  1.4* 2.1 1.8  --   --   PHOS  --   --   --  4.8*  4.7*  --   --   --    GFR: Estimated Creatinine Clearance: 30.1 mL/min (A) (by C-G formula based on SCr of 2.94 mg/dL (H)). Liver Function Tests: Recent Labs  Lab 10/03/22 1450  AST 34  ALT 14  ALKPHOS 88  BILITOT 2.1*  PROT 6.3*  ALBUMIN 2.4*   Recent Labs  Lab 10/03/22 1447  LIPASE 26   Recent Labs  Lab 10/03/22 1449  AMMONIA 34   Coagulation Profile: Recent Labs  Lab 10/03/22 1447  INR 1.7*  CBG: Recent Labs  Lab  10/08/22 0652 10/08/22 0732 10/08/22 1123 10/08/22 1214 10/08/22 1233  GLUCAP 117* 91 66* 65* 110*   Recent Labs  Lab 10/03/22 1704 10/03/22 1955 10/03/22 2302 10/04/22 0246  PROCALCITON  --   --  3.28  --   LATICACIDVEN 3.6* 3.2* 2.5* 2.0*    Recent Results (from the past 240 hour(s))  Blood Culture (routine x 2)     Status: None   Collection Time: 10/03/22  3:08 PM   Specimen: BLOOD  Result Value Ref Range Status   Specimen Description BLOOD RIGHT ANTECUBITAL  Final   Special Requests   Final    BOTTLES DRAWN AEROBIC AND ANAEROBIC  Blood Culture results may not be optimal due to an inadequate volume of blood received in culture bottles   Culture   Final    NO GROWTH 5 DAYS Performed at [facility], [place] [address], [place], [place] [address]    Report Status 10/08/2022 FINAL  Final  Resp panel by RT-PCR (RSV, Flu A&B, Covid) Anterior Nasal Swab     Status: None   Collection Time: 10/03/22  3:13 PM   Specimen: Anterior Nasal Swab  Result Value Ref Range Status   SARS Coronavirus 2 by RT PCR NEGATIVE NEGATIVE Final    Comment: (NOTE) SARS-CoV-2 target nucleic acids are NOT DETECTED.  The SARS-CoV-2 RNA is generally detectable in upper respiratory specimens during the acute phase of infection. The lowest concentration of SARS-CoV-2 viral copies this assay can detect is 138 copies/mL. A negative result does not preclude SARS-Cov-2 infection and should not be used as the sole basis for treatment or other patient management decisions. A negative result may occur with  improper specimen collection/handling, submission of specimen other than nasopharyngeal swab, presence of viral mutation(s) within the areas targeted by this assay, and inadequate number of viral copies(<138 copies/mL). A negative result must be combined with clinical observations, patient history, and epidemiological information. The expected result is Negative.  Fact Sheet for Patients:  EntrepreneurPulse.com.au  Fact Sheet for Healthcare Providers:  IncredibleEmployment.be  This test is no t yet approved or cleared by the [place] FDA and  has been authorized for detection and/or diagnosis of SARS-CoV-2 by FDA under an Emergency Use Authorization (EUA). This EUA will remain  in effect (meaning this test can be used) for the duration of the COVID-19 declaration under Section 564(b)(1) of the Act, 21 U.S.C.section 360bbb-3(b)(1), unless the authorization is terminated  or revoked sooner.        Influenza A by PCR NEGATIVE NEGATIVE Final   Influenza B by PCR NEGATIVE NEGATIVE Final    Comment: (NOTE) The Xpert Xpress SARS-CoV-2/FLU/RSV plus assay is intended as an aid in the diagnosis of influenza from Nasopharyngeal swab specimens and should not be used as a sole basis for treatment. Nasal washings and aspirates are unacceptable for Xpert Xpress SARS-CoV-2/FLU/RSV testing.  Fact Sheet for Patients: EntrepreneurPulse.com.au  Fact Sheet for Healthcare Providers: IncredibleEmployment.be  This test is not yet approved or cleared by the [place] FDA and has been authorized for detection and/or diagnosis of SARS-CoV-2 by FDA under an Emergency Use Authorization (EUA). This EUA will remain in effect (meaning this test can be used) for the duration of the COVID-19 declaration under Section 564(b)(1) of the Act, 21 U.S.C. section 360bbb-3(b)(1), unless the authorization is terminated or revoked.     Resp Syncytial Virus by PCR NEGATIVE NEGATIVE Final    Comment: (NOTE) Fact Sheet for Patients: EntrepreneurPulse.com.au  Fact Sheet  for Healthcare Providers: IncredibleEmployment.be  This test is not yet approved or cleared by the [place] and has been authorized for detection and/or diagnosis of SARS-CoV-2 by FDA under an Emergency Use Authorization (EUA). This EUA will remain in effect (meaning this test can be used) for the duration of the COVID-19 declaration under Section 564(b)(1) of the Act, 21 U.S.C. section 360bbb-3(b)(1), unless the authorization is terminated or revoked.  Performed at [facility], [place] [address], [place], [place] [address]   Blood Culture (routine x 2)     Status: None   Collection Time: 10/03/22  3:13 PM   Specimen: BLOOD RIGHT FOREARM  Result Value Ref Range Status   Specimen Description BLOOD RIGHT FOREARM  Final   Special Requests   Final    BOTTLES  DRAWN AEROBIC AND ANAEROBIC Blood Culture results may not be optimal due to an inadequate volume of blood received in culture bottles   Culture   Final    NO GROWTH 5 DAYS Performed at [facility], [place] [address] [place], [place] [address]    Report Status 10/08/2022 FINAL  Final  Body fluid culture w Gram Stain     Status: None   Collection Time: 10/03/22  4:20 PM   Specimen: Peritoneal Cavity; Peritoneal Fluid  Result Value Ref Range Status   Specimen Description PERITONEAL CAVITY  Final   Special Requests NONE  Final   Gram Stain   Final    RARE WBC PRESENT,BOTH PMN AND MONONUCLEAR NO ORGANISMS SEEN    Culture   Final    NO GROWTH 3 DAYS Performed at [facility], [place] [address] [place], [place] [address]    Report Status 10/07/2022 FINAL  Final  MRSA Next Gen by PCR, Nasal     Status: Abnormal   Collection Time: 10/03/22 10:35 PM  Result Value Ref Range Status   MRSA by PCR Next Gen DETECTED (A) NOT DETECTED Final    Comment: RESULT CALLED TO, READ BACK BY AND VERIFIED WITH: LILLY RN 10/04/22 '@0001'$  BY AB (NOTE) The GeneXpert MRSA Assay (FDA approved for NASAL specimens only), is one component of a comprehensive MRSA colonization surveillance program. It is not intended to diagnose MRSA infection nor to guide or monitor treatment for MRSA infections. Test performance is not FDA approved in patients less than 6 years old. Performed at [facility], [place] [address], [place], [place] [address]   Respiratory (~20 pathogens) panel by PCR     Status: None   Collection Time: 10/04/22 11:49 AM   Specimen: Nasopharyngeal Swab; Respiratory  Result Value Ref Range Status   Adenovirus NOT DETECTED NOT DETECTED Final   Coronavirus 229E NOT DETECTED NOT DETECTED Final    Comment: (NOTE) The Coronavirus on the Respiratory Panel, DOES NOT test for the novel  Coronavirus (2019 nCoV)    Coronavirus HKU1 NOT DETECTED NOT DETECTED Final   Coronavirus NL63 NOT  DETECTED NOT DETECTED Final   Coronavirus OC43 NOT DETECTED NOT DETECTED Final   Metapneumovirus NOT DETECTED NOT DETECTED Final   Rhinovirus / Enterovirus NOT DETECTED NOT DETECTED Final   Influenza A NOT DETECTED NOT DETECTED Final   Influenza B NOT DETECTED NOT DETECTED Final   Parainfluenza Virus 1 NOT DETECTED NOT DETECTED Final   Parainfluenza Virus 2 NOT DETECTED NOT DETECTED Final   Parainfluenza Virus 3 NOT DETECTED NOT DETECTED Final   Parainfluenza Virus 4 NOT DETECTED NOT DETECTED Final  Respiratory Syncytial Virus NOT DETECTED NOT DETECTED Final   Bordetella pertussis NOT DETECTED NOT DETECTED Final   Bordetella Parapertussis NOT DETECTED NOT DETECTED Final   Chlamydophila pneumoniae NOT DETECTED NOT DETECTED Final   Mycoplasma pneumoniae NOT DETECTED NOT DETECTED Final    Comment: Performed at [facility], [place] [address], [place], [place] [address]    Antimicrobials: Anti-infectives (From admission, onward)    Start     Dose/Rate Route Frequency Ordered Stop   10/06/22 0000  cefTRIAXone (ROCEPHIN) 2 g in sodium chloride 0.9 % 100 mL IVPB        2 g 200 mL/hr over 30 Minutes Intravenous Every 24 hours 10/05/22 0839 10/08/22 0000   10/05/22 1000  azithromycin (ZITHROMAX) tablet 500 mg        500 mg Oral Daily 10/05/22 0839 10/08/22 1005   10/04/22 1600  ceFEPIme (MAXIPIME) 1 g in sodium chloride 0.9 % 100 mL IVPB  Status:  Discontinued        1 g 200 mL/hr over 30 Minutes Intravenous Every 24 hours 10/03/22 1610 10/03/22 2136   10/04/22 1200  vancomycin (VANCOCIN) IVPB 1000 mg/200 mL premix  Status:  Discontinued        1,000 mg 200 mL/hr over 60 Minutes Intravenous Every M-W-F (Hemodialysis) 10/03/22 1610 10/03/22 2136   10/04/22 0000  cefTRIAXone (ROCEPHIN) 2 g in sodium chloride 0.9 % 100 mL IVPB  Status:  Discontinued        2 g 200 mL/hr over 30 Minutes Intravenous Every 24 hours 10/03/22 2136 10/05/22 0839   10/03/22 2200  azithromycin (ZITHROMAX) 500 mg  in sodium chloride 0.9 % 250 mL IVPB  Status:  Discontinued        500 mg 250 mL/hr over 60 Minutes Intravenous Every 24 hours 10/03/22 2136 10/05/22 0839   10/03/22 1515  vancomycin (VANCOREADY) IVPB 1750 mg/350 mL        1,750 mg 175 mL/hr over 120 Minutes Intravenous  Once 10/03/22 1509 10/03/22 1905   10/03/22 1500  ceFEPIme (MAXIPIME) 2 g in sodium chloride 0.9 % 100 mL IVPB        2 g 200 mL/hr over 30 Minutes Intravenous  Once 10/03/22 1457 10/03/22 1557   10/03/22 1500  metroNIDAZOLE (FLAGYL) IVPB 500 mg        500 mg 100 mL/hr over 60 Minutes Intravenous  Once 10/03/22 1457 10/03/22 1700   10/03/22 1500  vancomycin (VANCOCIN) IVPB 1000 mg/200 mL premix  Status:  Discontinued        1,000 mg 200 mL/hr over 60 Minutes Intravenous  Once 10/03/22 1457 10/03/22 1509      Culture/Microbiology    Component Value Date/Time   SDES PERITONEAL CAVITY 10/03/2022 1620   SPECREQUEST NONE 10/03/2022 1620   CULT  10/03/2022 1620    NO GROWTH 3 DAYS Performed at [facility], [place] [address], [place], [place] [address]    REPTSTATUS 10/07/2022 FINAL 10/03/2022 1620  Radiology Studies: DG CHEST PORT 1 VIEW  Result Date: 10/08/2022 CLINICAL DATA:  [id number] Acute respiratory failure with hypoxia [facility]) [id number] EXAM: PORTABLE CHEST 1 VIEW COMPARISON:  October 04, 2022 FINDINGS: The cardiomediastinal silhouette is unchanged and enlarged in contour.RIGHT chest double lumen catheter tip terminates over the RIGHT atrium. No significant pleural effusion. No pneumothorax. Similar minimal bibasilar opacities, likely atelectasis. No acute pleuroparenchymal abnormality. Remote RIGHT-sided rib fractures. IMPRESSION: Similar minimal bibasilar opacities, likely atelectasis. Electronically Signed   By: Valentino Saxon M.D.  On: 10/08/2022 07:46     LOS: 5 days   Antonieta Pert, MD [facility]  10/08/2022, 12:57 PM

## 2022-10-08 NOTE — Progress Notes (Signed)
Physical Therapy Treatment Patient Details Name: Benjamin [name] MRN: [id number] DOB: [birth year] Today's Date: 10/08/2022   History of Present Illness 67 yo male admitted 1/28 with AMS and hypoglycemia. Moderate ascites with diagnostic paracentesis in ED. 1/29 RUE infiltration. PMHx: T2DM, ESRD on HD MWF, HFrEF, cirrhosis    PT Comments    Pt pleasant but increased confusion, hallucinating that others are present in room. Pt with increased posterior lean with transfers requiring mod-max assist to stand from various surfaces. Pt with limited caregiver support per son at last session and with increasing confusion and assist SNF currently most appropriate option with pt aware and agreeable. Will continue to follow.   HR 68-73 SpO2 96-98% on RA BP pre gait 121/51, post session 136/52    Recommendations for follow up therapy are one component of a multi-disciplinary discharge planning process, led by the attending physician.  Recommendations may be updated based on patient status, additional functional criteria and insurance authorization.  Follow Up Recommendations  Skilled nursing-short term rehab (<3 hours/day) Can patient physically be transported by private vehicle: Yes   Assistance Recommended at Discharge Frequent or constant Supervision/Assistance  Patient can return home with the following Assistance with cooking/housework;Direct supervision/assist for medications management;Assist for transportation;A lot of help with walking and/or transfers;A lot of help with bathing/dressing/bathroom   Equipment Recommendations  Rolling walker (2 wheels)    Recommendations for Other Services       Precautions / Restrictions Precautions Precautions: Fall;Other (comment) Precaution Comments: watch BP; fragile skin     Mobility  Bed Mobility Overal bed mobility: Needs Assistance Bed Mobility: Supine to Sit     Supine to sit: Min assist, HOB elevated     General bed mobility comments:  HOb 35 degrees with min assist to pivot to EOB    Transfers Overall transfer level: Needs assistance   Transfers: Sit to/from Stand Sit to Stand: Mod assist           General transfer comment: mod assist to stand from elevated bed this session and from [facility] with max cues for hand placement and anterior translation    Ambulation/Gait Ambulation/Gait assistance: Mod assist Gait Distance (Feet): 60 Feet Assistive device: Rolling walker (2 wheels) Gait Pattern/deviations: Step-through pattern, Decreased stride length, Trunk flexed   Gait velocity interpretation: <1.31 ft/sec, indicative of household ambulator   General Gait Details: mod assit to control RW and for anterior translation and pt with maintained posterior lean, significant assist to direct RW and max verbal cues. Pt walked 10' to [facility] then 66' in hall with progression from mod to min assist but continued to require min assist to change direction of RW with turning   Stairs             Wheelchair Mobility    Modified Rankin (Stroke Patients Only)       Balance Overall balance assessment: Needs assistance Sitting-balance support: No upper extremity supported, Feet supported Sitting balance-Leahy Scale: Fair     Standing balance support: Bilateral upper extremity supported, Reliant on assistive device for balance Standing balance-Leahy Scale: Poor Standing balance comment: bil UE on RW in standing with mod assist due to posterior bias                            Cognition Arousal/Alertness: Awake/alert Behavior During Therapy: Flat affect Overall Cognitive Status: Impaired/Different from baseline Area of Impairment: Safety/judgement, Problem solving, Attention, Awareness, Orientation, Memory, Following commands  Orientation Level: Disoriented to, Time, Place, Situation Current Attention Level: Sustained Memory: Decreased short-term memory Following Commands: Follows one  step commands inconsistently, Follows one step commands with increased time Safety/Judgement: Decreased awareness of safety, Decreased awareness of deficits   Problem Solving: Slow processing, Requires verbal cues, Requires tactile cues General Comments: pt with utensils in bed and unaware. pt not oriented other than to self this date with decreased processing and awareness from prior sessions        Exercises General Exercises - Lower Extremity Long Arc Quad: AROM, Both, Seated, 20 reps Hip Flexion/Marching: AROM, Both, Seated, 20 reps    General Comments        Pertinent Vitals/Pain Pain Assessment Pain Assessment: No/denies pain    Home Living                          Prior Function            PT Goals (current goals can now be found in the care plan section) Progress towards PT goals: Progressing toward goals    Frequency    Min 3X/week      PT Plan Discharge plan needs to be updated    Co-evaluation              AM-PAC PT "6 Clicks" Mobility   Outcome Measure  Help needed turning from your back to your side while in a flat bed without using bedrails?: A Little Help needed moving from lying on your back to sitting on the side of a flat bed without using bedrails?: A Little Help needed moving to and from a bed to a chair (including a wheelchair)?: A Lot Help needed standing up from a chair using your arms (e.g., wheelchair or bedside chair)?: A Lot Help needed to walk in hospital room?: A Lot Help needed climbing 3-5 steps with a railing? : Total 6 Click Score: 13    End of Session Equipment Utilized During Treatment: Gait belt Activity Tolerance: Patient tolerated treatment well Patient left: in chair;with call bell/phone within reach;with chair alarm set Nurse Communication: Mobility status PT Visit Diagnosis: Other abnormalities of gait and mobility (R26.89);Muscle weakness (generalized) (M62.81)     Time: 3762-8315 PT Time  Calculation (min) (ACUTE ONLY): 38 min  Charges:  $Gait Training: 8-22 mins $Therapeutic Exercise: 8-22 mins $Therapeutic Activity: 8-22 mins                     Bayard Males, PT Acute Rehabilitation Services Office: [place] 10/08/2022, 9:35 AM

## 2022-10-08 NOTE — Progress Notes (Addendum)
[facility] NEPHROLOGY PROGRESS NOTE  Assessment/ Plan: OP HD orders:  [place] MWF 4h  400/600  86.9kg  3K/2.5Ca bath  TDC  RIJ + LUA AVF  Heparin none - last HD 1/26, post wt 88.3kg   - soft BP's at HD in 90s for last 2 wks at minimum - venofer '50mg'$  weekly - mircera 30 mcg q4 wks, last on 1/15, due 2/12    # Septic shock from pneumonia/RML: Completed ceftriaxone and azithromycin.  Off of Levophed.  BP acceptable on midodrine.  Clinically much better.    # ESRD MWF: Plan for dialysis today.  He can come up to the unit for HD.  Currently has tunneled HD catheter for the access.  He can be discharged from renal perspective after dialysis today.  # Acute hypoxic respiratory failure due to pneumonia: Improved and now on room air.     # Anemia of CKD: Hemoglobin at goal.  Winfield Rast as outpatient.  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    # Hyponatremia: Managed with HD.  UF as tolerated.  Recommend fluid restriction.  # Hypokalemia: HD with higher potassium bath..  Subjective: Seen and examined in ICU.  Clinically improved.  Completed antibiotics.  Not on pressors.  It seems like he is awaiting to transfer to the floor.  No new event. Objective Vital signs in last 24 hours: Vitals:   10/08/22 0600 10/08/22 0630 10/08/22 0736 10/08/22 0906  BP: 130/80 (!) 123/45    Pulse: 71 69  73  Resp: 20 14    Temp:   (!) 96 F (35.6 C)   TempSrc:   Axillary   SpO2: 97% 96%  97%  Weight:      Height:       Weight change: 3.1 kg  Intake/Output Summary (Last 24 hours) at 10/08/2022 1055 Last data filed at 10/07/2022 2000 Gross per 24 hour  Intake 74.25 ml  Output --  Net 74.25 ml        Labs: RENAL PANEL Recent Labs    03/31/22 0710 10/03/22 1450 10/03/22 2000 10/03/22 2302 10/04/22 0712 10/05/22 0430 10/06/22 0231 10/08/22 0417  NA 126* 127* 132*  --  130* 128* 127* 130*  K 3.9 3.4* 3.7  --  3.8 3.0* 3.5 3.0*  CL 92* 94*  --   --   92* 93* 91* 102  CO2  --  20*  --   --  '22 25 24 '$ 19*  GLUCOSE 77 113*  --   --  70 85 126* 61*  BUN 31* 17  --   --  '17 10 16 12  '$ CREATININE 4.20* 4.77*  --  3.87* 4.76* 3.19* 3.86* 2.94*  CALCIUM  --  8.2*  --   --  8.2* 7.9* 8.2* 6.7*  MG  --   --   --  1.4* 2.1 1.8  --   --   PHOS  --   --   --   --  4.8*  4.7*  --   --   --   ALBUMIN  --  2.4*  --   --   --   --   --   --       Liver Function Tests: Recent Labs  Lab 10/03/22 1450  AST 34  ALT 14  ALKPHOS 88  BILITOT 2.1*  PROT 6.3*  ALBUMIN 2.4*    Recent Labs  Lab 10/03/22 1447  LIPASE 26    Recent Labs  Lab 10/03/22 1449  AMMONIA 34    CBC: Recent Labs    10/03/22 2000 10/03/22 2302 10/04/22 0712 10/05/22 0430 10/08/22 0417  HGB 12.9* 9.5* 11.7* 10.4* 10.5*  MCV  --  94.6 97.5 91.2 89.8     Cardiac Enzymes: No results for input(s): "CKTOTAL", "CKMB", "CKMBINDEX", "TROPONINI" in the last 168 hours. CBG: Recent Labs  Lab 10/07/22 2316 10/08/22 0322 10/08/22 0621 10/08/22 0652 10/08/22 0732  GLUCAP 90 74 62* 117* 91     Iron Studies: No results for input(s): "IRON", "TIBC", "TRANSFERRIN", "FERRITIN" in the last 72 hours. Studies/Results: DG CHEST PORT 1 VIEW  Result Date: 10/08/2022 CLINICAL DATA:  [id number] Acute respiratory failure with hypoxia [facility]) [id number] EXAM: PORTABLE CHEST 1 VIEW COMPARISON:  October 04, 2022 FINDINGS: The cardiomediastinal silhouette is unchanged and enlarged in contour.RIGHT chest double lumen catheter tip terminates over the RIGHT atrium. No significant pleural effusion. No pneumothorax. Similar minimal bibasilar opacities, likely atelectasis. No acute pleuroparenchymal abnormality. Remote RIGHT-sided rib fractures. IMPRESSION: Similar minimal bibasilar opacities, likely atelectasis. Electronically Signed   By: Valentino Saxon M.D.   On: 10/08/2022 07:46    Medications: Infusions:  sodium chloride 250 mL (10/07/22 2323)   anticoagulant sodium citrate      anticoagulant sodium citrate      Scheduled Medications:  arformoterol  15 mcg Nebulization BID   budesonide (PULMICORT) nebulizer solution  0.5 mg Nebulization BID   Chlorhexidine Gluconate Cloth  6 each Topical Q0600   Chlorhexidine Gluconate Cloth  6 each Topical Q0600   Chlorhexidine Gluconate Cloth  6 each Topical Q0600   dextrose       dextrose       folic acid  1 mg Oral Daily   heparin  5,000 Units Subcutaneous Q8H   midodrine  20 mg Oral Q8H   multivitamin with minerals  1 tablet Oral Daily   mupirocin ointment  1 Application Nasal BID   mouth rinse  15 mL Mouth Rinse 4 times per day   sodium chloride flush  10-40 mL Intracatheter Q12H   thiamine  100 mg Oral Daily    have reviewed scheduled and prn medications.  Physical Exam: General: Alert awake and comfortable.Marland Kitchen Heart:RRR, s1s2 nl Lungs: Clear bilateral.  No wheeze. Abdomen:soft, Non-tender, non-distended Extremities: No edema. Dialysis Access: RIJ TDC in place.  Oree Hislop Prasad Jesse Hirst 10/08/2022,10:55 AM  LOS: 5 days

## 2022-10-08 NOTE — Progress Notes (Signed)
Upon assessment, patient was noticeably more confused, slurred speech, unable to wake up or hold conversation without constant physical simulation. Blood sugar was 65 on recheck - 1/2 amp dextrose administered.Vitals stable. Patient able to follow commands/move all extremities, smile and raise eyebrows without deficit. Antonieta Pert, MD informed of mental status change and at beside momentarily.

## 2022-10-08 NOTE — Hospital Course (Addendum)
66yom w/ complex medical history with ESRD on HD MWF, chronic systolic CHF with EF 77%, T2DM, previous alcohol abuse with cirrhosis presented to the ED with altered mental status slurred speech on 1/28.  Wife reported he has been having bruising productive cough yellow phlegm past day history placed in 1/26 but did not finish due to shortness of breath increasingly became altered and came to the ED In the ED patient is confused hypoglycemic in the 40s hypoxic in 80s SBP in 70s-80s hypothermic: Felt to be in septic shock cultures obtained and started on IV antibiotics, pressors, 1.5L of IV fluids and albumin. CT chest/abdomen/pelvis showing mild patchy opacities in RML; cirrhosis with moderate ascites.  Diagnostic paracentesis performed by ED physician.  Labs with thrombocytopenia elevated troponin MNF 34 alcohol level normal leukocytosis 13.5 hemoglobin 12.1 lactic acid 3.4-3 0.6-3.2 BNP more than 4500, COVID/flu/RSV negative, VBG 7.24, 53, 40, 23. Patient was admitted to ICU seen by nephrology underwent HD 1/29 1 L removed, echo showed EF 40% G2 DD.  1/3o briefly off Levophed during daytime.  No SBP on peritoneal fluid analysis. He has been off pressors and subsequently transferred to [facility] service 2/2.

## 2022-10-09 LAB — GLUCOSE, CAPILLARY
Glucose-Capillary: 67 mg/dL — ABNORMAL LOW (ref 70–99)
Glucose-Capillary: 76 mg/dL (ref 70–99)
Glucose-Capillary: 82 mg/dL (ref 70–99)
Glucose-Capillary: 54 mg/dL — ABNORMAL LOW (ref 70–99)
Glucose-Capillary: 61 mg/dL — ABNORMAL LOW (ref 70–99)
Glucose-Capillary: 104 mg/dL — ABNORMAL HIGH (ref 70–99)
Glucose-Capillary: 79 mg/dL (ref 70–99)
Glucose-Capillary: 78 mg/dL (ref 70–99)

## 2022-10-09 LAB — CORTISOL: Cortisol, Plasma: 6.3 ug/dL

## 2022-10-09 MED ORDER — HALOPERIDOL LACTATE 5 MG/ML IJ SOLN
1.0000 mg | Freq: Four times a day (QID) | INTRAMUSCULAR | Status: DC | PRN
  Administered 2022-10-09: 1 mg via INTRAVENOUS
  Filled 2022-10-09: qty 1

## 2022-10-09 MED ORDER — DEXTROSE 10 % IV SOLN: INTRAVENOUS | Status: DC

## 2022-10-09 MED ORDER — GLUCOSE 40 % PO GEL
1.0000 | ORAL | Status: AC
  Administered 2022-10-09: 31 g via ORAL
  Filled 2022-10-09: qty 1.21

## 2022-10-09 MED ORDER — QUETIAPINE FUMARATE 25 MG PO TABS
25.0000 mg | ORAL_TABLET | Freq: Every day | ORAL | Status: DC
  Administered 2022-10-09 – 2022-10-12 (×4): 25 mg via ORAL
  Filled 2022-10-09 (×4): qty 1

## 2022-10-09 MED ORDER — DEXTROSE IN LACTATED RINGERS 5 % IV SOLN: INTRAVENOUS | Status: DC

## 2022-10-09 MED ORDER — COSYNTROPIN 0.25 MG IJ SOLR
0.2500 mg | Freq: Once | INTRAMUSCULAR | Status: AC
  Administered 2022-10-10: 0.25 mg via INTRAVENOUS
  Filled 2022-10-09 (×2): qty 0.25

## 2022-10-09 NOTE — Plan of Care (Signed)

## 2022-10-09 NOTE — Progress Notes (Signed)
The patient got admitted. Pt is confused and hallucinating. When I ask him to tell me his first and last name, he said " "they don't know shit about my son".   Pt said that " [place]" .  He thinks he is talking to sheriff and some people that are not in the room. Notified MD and got haldol order for agitation. Patient was able to sleep after getting IV haldol.

## 2022-10-09 NOTE — Progress Notes (Signed)
Palliative Care Progress Note, Assessment & Plan   Patient Name: Benjamin [name]       Date: 10/09/2022 DOB: 03-May-1956  Age: 67 y.o. MRN#: [id number] Attending Physician: [place] Benjamin J, DO Primary Care Physician: Benjamin Flow, MD Admit Date: 10/03/2022  Subjective: Patient is sitting up in bed in no apparent distress.  He acknowledges my presence.  However, he is making nonlinear comments.  He asked if I knew more than just those two Spanish words.  He appears to be talking to someone not present in the room.  No family or friends present at bedside.  HPI: 67 y.o. male  with past medical history of EtOH abuse, cirrhosis, ESRD (MWF-[place]), advanced heart failure (EF 20%), and type 2 diabetes admitted on 10/03/2022 with AMS.   Patient is being treated for septic shock related to R ML space CAP (ceftriaxone and Zithromax), ARF, hypokalemia, and hypoglycemia.  Course of hospitalization has required use of Levophed drip, but it is currently remaining off with blood pressure soft.  He remains confused with recurrent hypoglycemic episodes.  Intake continues to remain remain poor.  PMT was consulted to discuss goals of care  Summary of counseling/coordination of care: After reviewing the patient's chart and assessing the patient at bedside, I counseled with patient's son Benjamin [name] in regards to plan and goals of care. Benjamin [name] and I met bedside after patient was transferred to Benjamin [name] is bedside as well.   Brief medical update given.  Hallucinations and encephalopathy continues.  Benjamin [name] and his mother inquire as to etiology.  We discussed that we are attempting to rule out and rule in possible causes.  Reviewed hypoglycemia, pneumonia, history of EtOH abuse, and hospital delirium as  possible contributing factors to patient's current hallucinations.  Patient has just received a dose of Haldol and appears to be resting comfortably with respirations even and unlabored.  Family shares they are happy to see him finally resting better concerned that he seems so "off".  Discussed that patient may need a CT of the head as well as a psychiatry consult if him improvement is not noted. Education provided on haldol and delirium precautions.   Functional, nutritional, and cognitive status discussed.  We reviewed that patient has become deconditioned and his been recommended for SNF placement.  Benjamin [name] is in agreement with this as he is concerned for patient's wellbeing at returning to his home with current wife Benjamin [name].  Therapeutic silence and active listening provided for Benjamin [name] to share his thoughts and emotions regarding patient's current medical situation.  Emotional support provided.  I attempted to elicit goals and values important to the patient.  Benjamin [name] shares that his father was always stating that if there is a chance to save him then he would like to take it.  However otherwise, he would not want to be in a vegetative state.  CODE STATUS discussed in light of [name] comments.  We reviewed full code vs DNR as well as DNI status. Encouraged family to consider DNR/DNI status understanding evidenced based poor outcomes in similar hospitalized patients, as the cause of the arrest is likely associated with chronic/terminal disease rather than a reversible acute cardio-pulmonary event.  Benjamin [name]  and his mother are in agreement that they would not want patient to suffer through trauma of a code but are not quite ready to make final decision on shifting CODE STATUS at this time.  Full code and full scope remain.  Watchful waiting in hopes of patient's encephalopath improving discussed.   PMT will continue to follow the patient and his family.  Family has PMT contact information and was  encouraged to call with any palliative needs.  Physical Exam Vitals reviewed.  Constitutional:      General: He is not in acute distress.    Appearance: He is obese. He is not ill-appearing.  HENT:     Head: Normocephalic.     Mouth/Throat:     Mouth: Mucous membranes are moist.  Eyes:     Pupils: Pupils are equal, round, and reactive to light.  Cardiovascular:     Rate and Rhythm: Normal rate.     Pulses: Normal pulses.  Pulmonary:     Effort: Pulmonary effort is normal.  Abdominal:     Palpations: Abdomen is soft.  Skin:    General: Skin is warm and dry.     Comments: Biltaeral UE wraps in place - UTA wounds, dressing are clean, dry, and intact  Neurological:     Mental Status: He is alert.     Comments: Hallucinating - speaking to people not in the room             Total Time 50 minutes   Curt Bears L. Ilsa Iha, FNP-BC Palliative Medicine Team Team Phone # [phone]

## 2022-10-09 NOTE — Progress Notes (Signed)
[name] NEPHROLOGY PROGRESS NOTE  Assessment/ Plan: OP HD orders:  [place] MWF 4h  400/600  86.9kg  3K/2.5Ca bath  TDC  RIJ + LUA AVF  Heparin none - last HD 1/26, post wt 88.3kg   - soft BP's at HD in 90s for last 2 wks at minimum - venofer '50mg'$  weekly - mircera 30 mcg q4 wks, last on 1/15, due 2/12    # Septic shock from pneumonia/RML: Completed ceftriaxone and azithromycin.  Off of Levophed.  Currently on midodrine with some low BP overnight.  # ESRD MWF: Status post dialysis yesterday with 2.1 L UF, some intradialytic hypotension.  Plan for next HD on 2/5. Currently has tunneled HD catheter for the access.  Recommend to discontinue LR fluid.  May consider concentrated dextrose fluid if needed for hypoglycemia.  Discussed with the primary team.  # Acute hypoxic respiratory failure due to pneumonia: Improved and now on room air.     # Anemia of CKD: Hemoglobin at goal.  Benjamin [name] as outpatient.  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    # Hyponatremia: Managed with HD.  UF as tolerated.  Recommend fluid restriction.  # Hypokalemia: HD with higher potassium bath.  # Acute metabolic encephalopathy: Some ICU delirium today, supportive care and reorientation.  Subjective: Seen and examined in ICU.  Noted blood pressure on the lower side last night.  Had dialysis yesterday without event except intradialytic hypotension.  He was delirious this morning.  Noted hypoglycemic event and was receiving LR with dextrose. Objective Vital signs in last 24 hours: Vitals:   10/09/22 0630 10/09/22 0645 10/09/22 0700 10/09/22 0800  BP: (!) 96/53  (!) 85/35   Pulse: 74 77 75   Resp: '19 16 15   '$ Temp:    97.6 F (36.4 C)  TempSrc:    Oral  SpO2: 98% 98% 99%   Weight:      Height:       Weight change: -2.3 kg  Intake/Output Summary (Last 24 hours) at 10/09/2022 0806 Last data filed at 10/09/2022 0600 Gross per 24 hour  Intake 807.79 ml  Output 2101  ml  Net -1293.21 ml        Labs: RENAL PANEL Recent Labs    03/31/22 0710 10/03/22 1450 10/03/22 2000 10/03/22 2302 10/04/22 0712 10/05/22 0430 10/06/22 0231 10/08/22 0417 10/08/22 1209  NA 126* 127* 132*  --  130* 128* 127* 130* 127*  K 3.9 3.4* 3.7  --  3.8 3.0* 3.5 3.0* 4.1  CL 92* 94*  --   --  92* 93* 91* 102 92*  CO2  --  20*  --   --  '22 25 24 '$ 19* 21*  GLUCOSE 77 113*  --   --  70 85 126* 61* 64*  BUN 31* 17  --   --  '17 10 16 12 16  '$ CREATININE 4.20* 4.77*  --  3.87* 4.76* 3.19* 3.86* 2.94* 3.99*  CALCIUM  --  8.2*  --   --  8.2* 7.9* 8.2* 6.7* 8.7*  MG  --   --   --  1.4* 2.1 1.8  --   --   --   PHOS  --   --   --   --  4.8*  4.7*  --   --   --  2.5  ALBUMIN  --  2.4*  --   --   --   --   --   --  2.7*      Liver Function Tests: Recent Labs  Lab 10/03/22 1450 10/08/22 1209  AST 34  --   ALT 14  --   ALKPHOS 88  --   BILITOT 2.1*  --   PROT 6.3*  --   ALBUMIN 2.4* 2.7*    Recent Labs  Lab 10/03/22 1447  LIPASE 26    Recent Labs  Lab 10/03/22 1449  AMMONIA 34    CBC: Recent Labs    10/03/22 2302 10/04/22 0712 10/05/22 0430 10/08/22 0417 10/08/22 1209  HGB 9.5* 11.7* 10.4* 10.5* 10.9*  MCV 94.6 97.5 91.2 89.8 92.4     Cardiac Enzymes: No results for input(s): "CKTOTAL", "CKMB", "CKMBINDEX", "TROPONINI" in the last 168 hours. CBG: Recent Labs  Lab 10/08/22 2010 10/08/22 2313 10/09/22 0351 10/09/22 0427 10/09/22 0757  GLUCAP 73 72 67* 76 82     Iron Studies: No results for input(s): "IRON", "TIBC", "TRANSFERRIN", "FERRITIN" in the last 72 hours. Studies/Results: DG CHEST PORT 1 VIEW  Result Date: 10/08/2022 CLINICAL DATA:  [id number] Acute respiratory failure with hypoxia [facility]) [id number] EXAM: PORTABLE CHEST 1 VIEW COMPARISON:  October 04, 2022 FINDINGS: The cardiomediastinal silhouette is unchanged and enlarged in contour.RIGHT chest double lumen catheter tip terminates over the RIGHT atrium. No significant pleural effusion. No  pneumothorax. Similar minimal bibasilar opacities, likely atelectasis. No acute pleuroparenchymal abnormality. Remote RIGHT-sided rib fractures. IMPRESSION: Similar minimal bibasilar opacities, likely atelectasis. Electronically Signed   By: Valentino Saxon M.D.   On: 10/08/2022 07:46    Medications: Infusions:  sodium chloride Stopped (10/08/22 2213)   dextrose 5% lactated ringers 75 mL/hr at 10/09/22 0600    Scheduled Medications:  arformoterol  15 mcg Nebulization BID   budesonide (PULMICORT) nebulizer solution  0.5 mg Nebulization BID   Chlorhexidine Gluconate Cloth  6 each Topical Q0600   Chlorhexidine Gluconate Cloth  6 each Topical Q0600   Chlorhexidine Gluconate Cloth  6 each Topical Q0600   [START ON 10/10/2022] cosyntropin  0.25 mg Intravenous Once   folic acid  1 mg Oral Daily   heparin  5,000 Units Subcutaneous Q8H   midodrine  20 mg Oral Q8H   multivitamin with minerals  1 tablet Oral Daily   mouth rinse  15 mL Mouth Rinse 4 times per day   sodium chloride flush  10-40 mL Intracatheter Q12H   thiamine  100 mg Oral Daily    have reviewed scheduled and prn medications.  Physical Exam: General: Confused delirious male, lying in bed. Heart:RRR, s1s2 nl Lungs: Clear bilateral.  No wheeze. Abdomen:soft, Non-tender, non-distended Extremities: No edema. Dialysis Access: RIJ TDC in place.  Benjamin [name] 10/09/2022,8:06 AM  LOS: 6 days

## 2022-10-09 NOTE — Progress Notes (Signed)
Pt's wife, Butch Penny came to visit and asking for every single details about pt's care. Pt's wife got upset and saying she has right to know everything about his care even if she can't make decision for Mr. Pardoe care plan.  I had to explain to Butch Penny, MD usually call one family member who is HPOA with pt's update. Pt's son, Lovena Le is the HPOA. There's family dynamics that they don't communicate to each other.

## 2022-10-09 NOTE — Progress Notes (Signed)
  X-cover Note: Persistent hypoglycemia. Rate increase from 30 ml/hr to 50 ml/hr. Still hypoglycemic. Changed IVF over to D5LR(was running D5W) and increased rate to 75 ml/hr.   Kristopher Oppenheim, DO [facility]

## 2022-10-09 NOTE — Progress Notes (Signed)
PROGRESS NOTE    Benjamin [name]  WIO:[id number] DOB: 1955/12/25 DOA: 10/03/2022 PCP: Mateo Flow, MD    Brief Narrative:   Benjamin [name] is a 67 y.o. male with past medical history significant for ESRD on HD MWF, chronic systolic congestive heart failure (LVEF 20%), type 2 diabetes mellitus, history of EtOH abuse with cirrhosis, who presented to [facility] ED on 1/28 via EMS from home for altered mental status, slurred speech.  Patient has been having increased productive cough with yellow sputum.  He last received HD on 1/26 but did not finish due to shortness of breath.  He subsequently became increasingly altered and presented to the ED for further evaluation.  Upon arrival to the ED, patient was noted to be altered/confused. WBC 13.5, Hgb 12.1, platelets 66, troponin 59 then 62, ammonia 34, ethanol WNL, NA 127, K3.4, CO2 20, LA 3.4 then 3.6 then 3.2, BNP greater than 4500, COVID/flu/RSV negative, VBG 7.24, 53, 40, 23.  CBG noted to be in the 40s and was given dextrose with some improvement in his mental status.  Patient was notably hypoxic in the 80s on room air and was placed on nasal cannula.  His SBP was low in the 70-80s and hypothermic at 92.8 F.  Sepsis protocol was initiated, cultures obtained and was started on broad-spectrum antibiotics.  Patient was given 1.5 L of IV fluids and albumin.  Patient remained hypotensive despite aggressive resuscitation and was initiated on vasopressors.  CT chest/abdomen/pelvis with mild patchy opacities right middle lobe, cirrhosis with moderate ascites.  Diagnostic paracentesis performed by ED physician.  PCCM consulted and was initially admitted to the intensive care unit.  Significant Hospital events: 1/28: Admitted to ICU for septic shock from CAP, on Levophed drip 1/29: Received hemodialysis, 1 L removed, TTE with LVEF 40%, grade 2 DD, RV function mildly reduced, RV size mildly enlarged, PASP 38.6 mmHg, LAE, RAE 1/30: Titrated off of Levophed drip,  work with PT, seen by palliative care. 1/31: Remains off Levophed drip.  Received HD. 2/1: Transferred to hospitalist service. 2/2: Remains confused, recurrent hypoglycemic episodes, poor intake 2/3: Recurrent hypoglycemia, IV fluids increased to 75 mL/h but given ESRD patient changed to D10 gtt at 54m/h; remains confused  Assessment & Plan:   Acute metabolic encephalopathy Patient presenting to ED with confusion.  Was notably hypoxic with SpO2 in the 80s on room air secondary to pneumonia.  Also history of EtOH abuse, ammonia level 34 on admission with EtOH level within normal limits.  Also hypotensive likely contributing factor. -- Continue treatment as below  Septic shock, POA Community-acquired pneumonia Patient presenting to ED with confusion, hypoxia with elevated WBC count of 13.5, procalcitonin 3.28.  Lactic acid up to 3.6.  Systolic blood pressure on admission in the 70s-80s and was hypothermic at 92.8 F.  COVID-19/RSV/influenza A/B PCR negative.  CT chest/abdomen/pelvis with mild dependent patchy opacities in the lungs bilaterally favoring mild aspiration.  Completed 6-day course of azithromycin and 5-day course of ceftriaxone.  Levophed was titrated off and supplemental oxygen also was titrated off. --Continue to monitor SpO2  EtOH cirrhosis with ascites Patient underwent paracentesis by EDP on admission, SBP ruled out.  Ammonia level 34 on admission.  EtOH level within normal limits.  Last drink was reported 2 years ago. --Continue volume management with hemodialysis. -- Folic acid, multivitamin, thiamine  Chronic combined systolic/diastolic congestive heart failure Previous LVEF 20%.  TTE on 1/29 with improved LVEF to 40%, LV with moderately decreased function, LV  with global hypokinesis, grade 2 diastolic dysfunction, RVSP 38.6 mmHg, biatrial enlargement, trivial MR, moderate aortic valve stenosis, moderate/severe TR, IVC dilated.  GDMT limited due to patient's hypotension --  Midodrine 20 mg p.o. every 8 hours  ESRD on HD MWF Metabolic acidosis --Nephrology following, appreciate assistance -- Continue HD per nephrology  Elevated troponin Troponin elevated 62 on admission with BNP elevated greater than 4500.  Etiology likely secondary to type II demand ischemia in the setting of septic shock and volume overload on admission.  Hx DM2 Hypoglycemia Hemoglobin A1c 4.6 on 10/03/2022.  Diet controlled at baseline.  Patient with recurrent hypoglycemic events during hospitalization, likely secondary to poor oral intake.  Cortisol level slightly low at 6.3. --D10 gtt at 50m/h --Continue to encourage increased oral intake --CBGs every 4 hours --Cosyntropin stimulation test ordered for tomorrow  Hallucinations Patient continues with active visual/auditory hallucinations, remains confused.  Unclear etiology as his pneumonia has been treated, now oxygenating well on room air.  Continues to have mild hypoglycemic episodes.  Unclear etiology.  History of EtOH abuse.  Unlikely to tolerate MRI brain at this time. -- Start Seroquel 25 mg p.o. nightly -- If no improvement, may need to consider CT head versus attempted MR brain and psychiatry consultation -- Repeat ammonia level in a.m., --Treating hypoglycemia as above -- Supportive care  Weakness/debility/deconditioning/gait disturbance: -- PT/OT recommend SNF placement -- TOC for assistance  Goals of care: -- Palliative care following   DVT prophylaxis: heparin injection 5,000 Units Start: 10/03/22 2200    Code Status: Full Code Family Communication: No family present at bedside this morning  Disposition Plan:  Level of care: Progressive Status is: Inpatient Remains inpatient appropriate because: Continues with hallucinations, hypoglycemia on IV dextrose drip, will eventually need SNF placement    Consultants:  PCCM -signed off 2/1 Nephrology Palliative care  Procedures:  Paracentesis  1/28  Antimicrobials:  Vancomycin 1/28 - 1/28 Metronidazole 1/28 - 1/28 Ceftriaxone 1/28 - 2/1 Cefepime 1/28 - 1/28 Azithromycin 1/28 - 2/2   Subjective: Patient seen examined at bedside, lying in bed.  Continues with active auditory/visual hallucinations.  States he is at "[place] [employer]".  States there is a person "hiding behind the fender".  No family present at bedside.  States he is "not hungry".  Unable to obtain any further ROS due to his continued confusion.  Discussed with RN, pending transfer to progressive care unit.  Recurrent hypoglycemic episode that was mild overnight, coverage physician increased rate of IV fluids.  Discussed with nephrology; will transition to D10 drip given his ESRD status.  Objective: Vitals:   10/09/22 0630 10/09/22 0645 10/09/22 0700 10/09/22 0800  BP: (!) 96/53  (!) 85/35 (!) 111/58  Pulse: 74 77 75 75  Resp: '19 16 15 '$ (!) 25  Temp:    97.6 F (36.4 C)  TempSrc:    Oral  SpO2: 98% 98% 99% 97%  Weight:      Height:        Intake/Output Summary (Last 24 hours) at 10/09/2022 1024 Last data filed at 10/09/2022 0800 Gross per 24 hour  Intake 957.69 ml  Output 2101 ml  Net -1143.31 ml   Filed Weights   10/08/22 0417 10/08/22 1938 10/09/22 0416  Weight: 96 kg 93.7 kg 93.7 kg    Examination:  Physical Exam: GEN: NAD, alert, confused with active auditory/visual hallucinations, chronically ill appearance, appears older than stated age HEENT: NCAT, PERRL, EOMI, sclera clear, MMM PULM: CTAB w/o wheezes/crackles, normal respiratory  effort, on room air CV: RRR w/o M/G/R GI: abd soft, NTND, NABS, no R/G/M MSK: Trace bilateral lower extremity peripheral edema, moves all extremities independently NEURO: No focal deficits appreciated, confused PSYCH: + Auditory/visual hallucinations Integumentary: Several areas of ecchymosis noted to extremities in various stages of healing, no other concerning rashes/lesions/wounds noted on exposed skin  surfaces    Data Reviewed: I have personally reviewed following labs and imaging studies  CBC: Recent Labs  Lab 10/03/22 1447 10/03/22 2000 10/03/22 2302 10/04/22 0712 10/05/22 0430 10/08/22 0417 10/08/22 1209  WBC 13.5*  --  13.5* 19.3* 11.4* 6.4 6.4  NEUTROABS 12.0*  --   --   --   --   --   --   HGB 12.1*   < > 9.5* 11.7* 10.4* 10.5* 10.9*  HCT 38.6*   < > 29.8* 38.6* 32.0* 31.6* 33.9*  MCV 94.8  --  94.6 97.5 91.2 89.8 92.4  PLT 66*  --  64* 75* 82* 57* 63*   < > = values in this interval not displayed.   Basic Metabolic Panel: Recent Labs  Lab 10/03/22 2302 10/04/22 0712 10/05/22 0430 10/06/22 0231 10/08/22 0417 10/08/22 1209  NA  --  130* 128* 127* 130* 127*  K  --  3.8 3.0* 3.5 3.0* 4.1  CL  --  92* 93* 91* 102 92*  CO2  --  '22 25 24 '$ 19* 21*  GLUCOSE  --  70 85 126* 61* 64*  BUN  --  '17 10 16 12 16  '$ CREATININE 3.87* 4.76* 3.19* 3.86* 2.94* 3.99*  CALCIUM  --  8.2* 7.9* 8.2* 6.7* 8.7*  MG 1.4* 2.1 1.8  --   --   --   PHOS  --  4.8*  4.7*  --   --   --  2.5   GFR: Estimated Creatinine Clearance: 21.9 mL/min (A) (by C-G formula based on SCr of 3.99 mg/dL (H)). Liver Function Tests: Recent Labs  Lab 10/03/22 1450 10/08/22 1209  AST 34  --   ALT 14  --   ALKPHOS 88  --   BILITOT 2.1*  --   PROT 6.3*  --   ALBUMIN 2.4* 2.7*   Recent Labs  Lab 10/03/22 1447  LIPASE 26   Recent Labs  Lab 10/03/22 1449  AMMONIA 34   Coagulation Profile: Recent Labs  Lab 10/03/22 1447  INR 1.7*   Cardiac Enzymes: No results for input(s): "CKTOTAL", "CKMB", "CKMBINDEX", "TROPONINI" in the last 168 hours. BNP (last 3 results) No results for input(s): "PROBNP" in the last 8760 hours. HbA1C: No results for input(s): "HGBA1C" in the last 72 hours. CBG: Recent Labs  Lab 10/08/22 2010 10/08/22 2313 10/09/22 0351 10/09/22 0427 10/09/22 0757  GLUCAP 73 72 67* 76 82   Lipid Profile: No results for input(s): "CHOL", "HDL", "LDLCALC", "TRIG", "CHOLHDL",  "LDLDIRECT" in the last 72 hours. Thyroid Function Tests: No results for input(s): "TSH", "T4TOTAL", "FREET4", "T3FREE", "THYROIDAB" in the last 72 hours. Anemia Panel: No results for input(s): "VITAMINB12", "FOLATE", "FERRITIN", "TIBC", "IRON", "RETICCTPCT" in the last 72 hours. Sepsis Labs: Recent Labs  Lab 10/03/22 1704 10/03/22 1955 10/03/22 2302 10/04/22 0246  PROCALCITON  --   --  3.28  --   LATICACIDVEN 3.6* 3.2* 2.5* 2.0*    Recent Results (from the past 240 hour(s))  Blood Culture (routine x 2)     Status: None   Collection Time: 10/03/22  3:08 PM   Specimen: BLOOD  Result Value Ref  Range Status   Specimen Description BLOOD RIGHT ANTECUBITAL  Final   Special Requests   Final    BOTTLES DRAWN AEROBIC AND ANAEROBIC Blood Culture results may not be optimal due to an inadequate volume of blood received in culture bottles   Culture   Final    NO GROWTH 5 DAYS Performed at [facility], [place] [address], [place], [place] [address]    Report Status 10/08/2022 FINAL  Final  Resp panel by RT-PCR (RSV, Flu A&B, Covid) Anterior Nasal Swab     Status: None   Collection Time: 10/03/22  3:13 PM   Specimen: Anterior Nasal Swab  Result Value Ref Range Status   SARS Coronavirus 2 by RT PCR NEGATIVE NEGATIVE Final    Comment: (NOTE) SARS-CoV-2 target nucleic acids are NOT DETECTED.  The SARS-CoV-2 RNA is generally detectable in upper respiratory specimens during the acute phase of infection. The lowest concentration of SARS-CoV-2 viral copies this assay can detect is 138 copies/mL. A negative result does not preclude SARS-Cov-2 infection and should not be used as the sole basis for treatment or other patient management decisions. A negative result may occur with  improper specimen collection/handling, submission of specimen other than nasopharyngeal swab, presence of viral mutation(s) within the areas targeted by this assay, and inadequate number of viral copies(<138  copies/mL). A negative result must be combined with clinical observations, patient history, and epidemiological information. The expected result is Negative.  Fact Sheet for Patients:  EntrepreneurPulse.com.au  Fact Sheet for Healthcare Providers:  IncredibleEmployment.be  This test is no t yet approved or cleared by the [place] FDA and  has been authorized for detection and/or diagnosis of SARS-CoV-2 by FDA under an Emergency Use Authorization (EUA). This EUA will remain  in effect (meaning this test can be used) for the duration of the COVID-19 declaration under Section 564(b)(1) of the Act, 21 U.S.C.section 360bbb-3(b)(1), unless the authorization is terminated  or revoked sooner.       Influenza A by PCR NEGATIVE NEGATIVE Final   Influenza B by PCR NEGATIVE NEGATIVE Final    Comment: (NOTE) The Xpert Xpress SARS-CoV-2/FLU/RSV plus assay is intended as an aid in the diagnosis of influenza from Nasopharyngeal swab specimens and should not be used as a sole basis for treatment. Nasal washings and aspirates are unacceptable for Xpert Xpress SARS-CoV-2/FLU/RSV testing.  Fact Sheet for Patients: EntrepreneurPulse.com.au  Fact Sheet for Healthcare Providers: IncredibleEmployment.be  This test is not yet approved or cleared by the [place] FDA and has been authorized for detection and/or diagnosis of SARS-CoV-2 by FDA under an Emergency Use Authorization (EUA). This EUA will remain in effect (meaning this test can be used) for the duration of the COVID-19 declaration under Section 564(b)(1) of the Act, 21 U.S.C. section 360bbb-3(b)(1), unless the authorization is terminated or revoked.     Resp Syncytial Virus by PCR NEGATIVE NEGATIVE Final    Comment: (NOTE) Fact Sheet for Patients: EntrepreneurPulse.com.au  Fact Sheet for Healthcare  Providers: IncredibleEmployment.be  This test is not yet approved or cleared by the [place] FDA and has been authorized for detection and/or diagnosis of SARS-CoV-2 by FDA under an Emergency Use Authorization (EUA). This EUA will remain in effect (meaning this test can be used) for the duration of the COVID-19 declaration under Section 564(b)(1) of the Act, 21 U.S.C. section 360bbb-3(b)(1), unless the authorization is terminated or revoked.  Performed at [facility], [place] [address], [place], [place] [address]  Blood Culture (routine x 2)     Status: None   Collection Time: 10/03/22  3:13 PM   Specimen: BLOOD RIGHT FOREARM  Result Value Ref Range Status   Specimen Description BLOOD RIGHT FOREARM  Final   Special Requests   Final    BOTTLES DRAWN AEROBIC AND ANAEROBIC Blood Culture results may not be optimal due to an inadequate volume of blood received in culture bottles   Culture   Final    NO GROWTH 5 DAYS Performed at [facility], [place] [address], [place], [place] [address]    Report Status 10/08/2022 FINAL  Final  Body fluid culture w Gram Stain     Status: None   Collection Time: 10/03/22  4:20 PM   Specimen: Peritoneal Cavity; Peritoneal Fluid  Result Value Ref Range Status   Specimen Description PERITONEAL CAVITY  Final   Special Requests NONE  Final   Gram Stain   Final    RARE WBC PRESENT,BOTH PMN AND MONONUCLEAR NO ORGANISMS SEEN    Culture   Final    NO GROWTH 3 DAYS Performed at [facility], [place] [address] [place], [place] [address]    Report Status 10/07/2022 FINAL  Final  MRSA Next Gen by PCR, Nasal     Status: Abnormal   Collection Time: 10/03/22 10:35 PM  Result Value Ref Range Status   MRSA by PCR Next Gen DETECTED (A) NOT DETECTED Final    Comment: RESULT CALLED TO, READ BACK BY AND VERIFIED WITH: LILLY RN 10/04/22 '@0001'$  BY AB (NOTE) The GeneXpert MRSA Assay (FDA approved for NASAL specimens only), is one  component of a comprehensive MRSA colonization surveillance program. It is not intended to diagnose MRSA infection nor to guide or monitor treatment for MRSA infections. Test performance is not FDA approved in patients less than 66 years old. Performed at [facility], [place] [address], [place], [place] [address]   Respiratory (~20 pathogens) panel by PCR     Status: None   Collection Time: 10/04/22 11:49 AM   Specimen: Nasopharyngeal Swab; Respiratory  Result Value Ref Range Status   Adenovirus NOT DETECTED NOT DETECTED Final   Coronavirus 229E NOT DETECTED NOT DETECTED Final    Comment: (NOTE) The Coronavirus on the Respiratory Panel, DOES NOT test for the novel  Coronavirus (2019 nCoV)    Coronavirus HKU1 NOT DETECTED NOT DETECTED Final   Coronavirus NL63 NOT DETECTED NOT DETECTED Final   Coronavirus OC43 NOT DETECTED NOT DETECTED Final   Metapneumovirus NOT DETECTED NOT DETECTED Final   Rhinovirus / Enterovirus NOT DETECTED NOT DETECTED Final   Influenza A NOT DETECTED NOT DETECTED Final   Influenza B NOT DETECTED NOT DETECTED Final   Parainfluenza Virus 1 NOT DETECTED NOT DETECTED Final   Parainfluenza Virus 2 NOT DETECTED NOT DETECTED Final   Parainfluenza Virus 3 NOT DETECTED NOT DETECTED Final   Parainfluenza Virus 4 NOT DETECTED NOT DETECTED Final   Respiratory Syncytial Virus NOT DETECTED NOT DETECTED Final   Bordetella pertussis NOT DETECTED NOT DETECTED Final   Bordetella Parapertussis NOT DETECTED NOT DETECTED Final   Chlamydophila pneumoniae NOT DETECTED NOT DETECTED Final   Mycoplasma pneumoniae NOT DETECTED NOT DETECTED Final    Comment: Performed at [facility], [place]. [address] [place], [place] [address]         Radiology Studies: DG CHEST PORT 1 VIEW  Result Date: 10/08/2022 CLINICAL DATA:  [id number] Acute respiratory failure with hypoxia (  [facility]) [id number] EXAM: PORTABLE CHEST 1 VIEW COMPARISON:  October 04, 2022 FINDINGS: The cardiomediastinal  silhouette is unchanged and enlarged in contour.RIGHT chest double lumen catheter tip terminates over the RIGHT atrium. No significant pleural effusion. No pneumothorax. Similar minimal bibasilar opacities, likely atelectasis. No acute pleuroparenchymal abnormality. Remote RIGHT-sided rib fractures. IMPRESSION: Similar minimal bibasilar opacities, likely atelectasis. Electronically Signed   By: Valentino Saxon M.D.   On: 10/08/2022 07:46        Scheduled Meds:  arformoterol  15 mcg Nebulization BID   budesonide (PULMICORT) nebulizer solution  0.5 mg Nebulization BID   Chlorhexidine Gluconate Cloth  6 each Topical Q0600   Chlorhexidine Gluconate Cloth  6 each Topical Q0600   Chlorhexidine Gluconate Cloth  6 each Topical Q0600   [START ON 10/10/2022] cosyntropin  0.25 mg Intravenous Once   folic acid  1 mg Oral Daily   heparin  5,000 Units Subcutaneous Q8H   midodrine  20 mg Oral Q8H   multivitamin with minerals  1 tablet Oral Daily   mouth rinse  15 mL Mouth Rinse 4 times per day   sodium chloride flush  10-40 mL Intracatheter Q12H   thiamine  100 mg Oral Daily   Continuous Infusions:  sodium chloride Stopped (10/08/22 2213)   dextrose 25 mL/hr at 10/09/22 0838     LOS: 6 days    Time spent: 56 minutes spent on chart review, discussion with nursing staff, consultants, updating family and interview/physical exam; more than 50% of that time was spent in counseling and/or coordination of care.    Shishir Krantz J [place] DO [facility] Available via Epic secure chat 7am-7pm After these hours, please refer to coverage provider listed on [email or web address] 10/09/2022, 10:24 AM

## 2022-10-10 LAB — CBC
HCT: 32.3 % — ABNORMAL LOW (ref 39.0–52.0)
Hemoglobin: 10.4 g/dL — ABNORMAL LOW (ref 13.0–17.0)
MCH: 29.3 pg (ref 26.0–34.0)
MCHC: 32.2 g/dL (ref 30.0–36.0)
MCV: 91 fL (ref 80.0–100.0)
Platelets: 71 K/uL — ABNORMAL LOW (ref 150–400)
RBC: 3.55 MIL/uL — ABNORMAL LOW (ref 4.22–5.81)
RDW: 17.7 % — ABNORMAL HIGH (ref 11.5–15.5)
WBC: 5.9 K/uL (ref 4.0–10.5)
nRBC: 0 % (ref 0.0–0.2)

## 2022-10-10 LAB — COMPREHENSIVE METABOLIC PANEL
ALT: 13 U/L (ref 0–44)
AST: 29 U/L (ref 15–41)
Albumin: 2.3 g/dL — ABNORMAL LOW (ref 3.5–5.0)
Alkaline Phosphatase: 78 U/L (ref 38–126)
Anion gap: 9 (ref 5–15)
BUN: 13 mg/dL (ref 8–23)
CO2: 25 mmol/L (ref 22–32)
Calcium: 8 mg/dL — ABNORMAL LOW (ref 8.9–10.3)
Chloride: 92 mmol/L — ABNORMAL LOW (ref 98–111)
Creatinine, Ser: 3.72 mg/dL — ABNORMAL HIGH (ref 0.61–1.24)
GFR, Estimated: 17 mL/min — ABNORMAL LOW (ref >60)
Glucose, Bld: 128 mg/dL — ABNORMAL HIGH (ref 70–99)
Potassium: 3.5 mmol/L (ref 3.5–5.1)
Sodium: 126 mmol/L — ABNORMAL LOW (ref 135–145)
Total Bilirubin: 1 mg/dL (ref 0.3–1.2)
Total Protein: 5.7 g/dL — ABNORMAL LOW (ref 6.5–8.1)

## 2022-10-10 LAB — GLUCOSE, CAPILLARY
Glucose-Capillary: 124 mg/dL — ABNORMAL HIGH (ref 70–99)
Glucose-Capillary: 143 mg/dL — ABNORMAL HIGH (ref 70–99)
Glucose-Capillary: 160 mg/dL — ABNORMAL HIGH (ref 70–99)
Glucose-Capillary: 177 mg/dL — ABNORMAL HIGH (ref 70–99)
Glucose-Capillary: 57 mg/dL — ABNORMAL LOW (ref 70–99)
Glucose-Capillary: 69 mg/dL — ABNORMAL LOW (ref 70–99)
Glucose-Capillary: 80 mg/dL (ref 70–99)
Glucose-Capillary: 93 mg/dL (ref 70–99)

## 2022-10-10 LAB — VITAMIN B12: Vitamin B-12: 1245 pg/mL — ABNORMAL HIGH (ref 180–914)

## 2022-10-10 LAB — MAGNESIUM: Magnesium: 1.6 mg/dL — ABNORMAL LOW (ref 1.7–2.4)

## 2022-10-10 LAB — PROCALCITONIN: Procalcitonin: 0.85 ng/mL

## 2022-10-10 LAB — RPR: RPR Ser Ql: NONREACTIVE

## 2022-10-10 LAB — ACTH STIMULATION, 3 TIME POINTS
Cortisol, 30 Min: 10.4 ug/dL
Cortisol, 60 Min: 9.2 ug/dL
Cortisol, Base: 5.8 ug/dL

## 2022-10-10 MED ORDER — MAGNESIUM SULFATE 2 GM/50ML IV SOLN
2.0000 g | Freq: Once | INTRAVENOUS | Status: DC
  Filled 2022-10-10: qty 50

## 2022-10-10 MED ORDER — CHLORHEXIDINE GLUCONATE CLOTH 2 % EX PADS
6.0000 | MEDICATED_PAD | Freq: Every day | CUTANEOUS | Status: DC
  Administered 2022-10-10 – 2022-10-13 (×3): 6 via TOPICAL

## 2022-10-10 MED ORDER — COSYNTROPIN 0.25 MG IJ SOLR: 0.2500 mg | Freq: Once | INTRAMUSCULAR | Status: DC

## 2022-10-10 NOTE — [place] FL2 (Signed)
[place] MEDICAID FL2 LEVEL OF CARE FORM     IDENTIFICATION  Patient Name: Benjamin [name] Birthdate: [birth year] Sex: male Admission Date (Current Location): 10/03/2022  [facility] and [place] Number:  Herbalist and Address:  [facility], [place] [address], [place], [place] [address]      Provider Number: [id number]  Attending Physician Name and Address:  [place] Eric J, DO  Relative Name and Phone Number:  [name] [phone]    Current Level of Care: Hospital Recommended Level of Care: [place] Prior Approval Number:    Date Approved/Denied:   PASRR Number: [id number]  Discharge Plan: SNF    Current Diagnoses: Patient Active Problem List   Diagnosis Date Noted   Septic shock ([place]) 71/69/6789   Alcoholic cirrhosis of liver with ascites ([place]) 10/03/2022   Acute respiratory failure with hypoxia ([place]) 10/03/2022   ESRD on dialysis ([place]) 10/03/2022   Thrombocytopenia ([place]) 38/06/1750   Chronic systolic CHF (congestive heart failure) ([place]) 08/04/2021   Diabetes mellitus type 2 in nonobese ([place]) 08/04/2021   Hyponatremia 03/26/2021   CKD (chronic kidney disease) stage 5, GFR 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 (HCC) 03/26/2021   Acute renal failure (ARF) ([place]) 03/04/2020   Hyperkalemia 02/58/5277   Metabolic acidosis 82/42/3536   Hypertensive urgency 03/04/2020   Anemia 03/04/2020    Orientation RESPIRATION BLADDER Height & Weight        O2 (2L) Continent Weight: 206 lb 2.1 oz (93.5 kg) Height:  6' (182.9 cm)  BEHAVIORAL SYMPTOMS/MOOD NEUROLOGICAL BOWEL NUTRITION STATUS      Incontinent Diet (see DC summary)  AMBULATORY STATUS COMMUNICATION OF NEEDS Skin   Limited Assist Verbally Surgical wounds (wound incision Arm anterior, distal left,)                       Personal Care Assistance Level of Assistance  Bathing, Feeding, Dressing Bathing Assistance: Limited assistance Feeding assistance: Independent Dressing Assistance:  Limited assistance     Functional Limitations Info  Sight, Hearing Sight Info: Impaired Hearing Info: Adequate      SPECIAL CARE FACTORS FREQUENCY  PT (By licensed PT), OT (By licensed OT)     PT Frequency: 5x per week OT Frequency: 5x per week            Contractures Contractures Info: Not present    Additional Factors Info  Code Status, Allergies Code Status Info: Full Allergies Info: Grass Pollen([id number] Vern), Penicillins           Current Medications (10/10/2022):  This is the current hospital active medication list Current Facility-Administered Medications  Medication Dose Route Frequency Provider Last Rate Last Admin   0.9 %  sodium chloride infusion  250 mL Intravenous Continuous Mick Sell, PA-C   Stopped at 10/08/22 2213   acetaminophen (TYLENOL) tablet 650 mg  650 mg Oral Q4H PRN Mick Sell, PA-C   650 mg at 10/06/22 2203   arformoterol (BROVANA) nebulizer solution 15 mcg  15 mcg Nebulization BID Mick Sell, PA-C   15 mcg at 10/09/22 1953   budesonide (PULMICORT) nebulizer solution 0.5 mg  0.5 mg Nebulization BID Mick Sell, PA-C   0.5 mg at 10/09/22 1953   Chlorhexidine Gluconate Cloth 2 % PADS 6 each  6 each Topical Q0600 Roney Jaffe, MD   6 each at 10/10/22 1443   Chlorhexidine Gluconate Cloth 2 % PADS 6 each  6 each Topical Q0600 Carolin Sicks, [place]  Reesa Chew, MD   6 each at 10/10/22 1937   Chlorhexidine Gluconate Cloth 2 % PADS 6 each  6 each Topical Q0600 Rosita Fire, MD   6 each at 10/10/22 [phone]   Chlorhexidine Gluconate Cloth 2 % PADS 6 each  6 each Topical Q0600 Rosita Fire, MD       dextrose 10 % infusion   Intravenous Continuous [place] Eric J, DO 50 mL/hr at 10/10/22 0659 Rate Change at 10/10/22 0659   docusate sodium (COLACE) capsule 100 mg  100 mg Oral BID PRN Mick Sell, PA-C   100 mg at 09/73/53 2992   folic acid (FOLVITE) tablet 1 mg  1 mg Oral Daily Mick Sell, PA-C   1 mg at 10/10/22 4268   haloperidol  lactate (HALDOL) injection 1 mg  1 mg Intravenous Q6H PRN [place] Eric J, DO   1 mg at 10/09/22 1233   heparin injection 5,000 Units  5,000 Units Subcutaneous Q8H Mick Sell, PA-C   5,000 Units at 10/10/22 3419   hydrOXYzine (ATARAX) tablet 25 mg  25 mg Oral Daily PRN Levie Heritage, MD   25 mg at 10/06/22 2203   ipratropium-albuterol (DUONEB) 0.5-2.5 (3) MG/3ML nebulizer solution 3 mL  3 mL Nebulization Q4H PRN Andres Labrum D, PA-C   3 mL at 10/06/22 6222   magnesium sulfate IVPB 2 g 50 mL  2 g Intravenous Once [place] Eric J, DO   Paused at 10/10/22 0805   midodrine (PROAMATINE) tablet 20 mg  20 mg Oral Q8H Simonne Maffucci B, MD   20 mg at 10/10/22 9798   multivitamin with minerals tablet 1 tablet  1 tablet Oral Daily Mick Sell, PA-C   1 tablet at 10/10/22 9211   Oral care mouth rinse  15 mL Mouth Rinse 4 times per day Juanito Doom, MD   15 mL at 10/09/22 2200   Oral care mouth rinse  15 mL Mouth Rinse PRN Juanito Doom, MD       polyethylene glycol (MIRALAX / GLYCOLAX) packet 17 g  17 g Oral Daily PRN Mick Sell, PA-C       QUEtiapine (SEROQUEL) tablet 25 mg  25 mg Oral QHS [place] Donnamarie Poag, DO   25 mg at 10/09/22 2156   sodium chloride flush (NS) 0.9 % injection 10-40 mL  10-40 mL Intracatheter Q12H Simonne Maffucci B, MD   10 mL at 10/10/22 9417   sodium chloride flush (NS) 0.9 % injection 10-40 mL  10-40 mL Intracatheter PRN Juanito Doom, MD       thiamine (VITAMIN B1) tablet 100 mg  100 mg Oral Daily Wilson Singer I, RPH   100 mg at 10/10/22 4081     Discharge Medications: Please see discharge summary for a list of discharge medications.  Relevant Imaging Results:  Relevant Lab Results:   Additional Information Pt goes to [facility] in [place] MWF, family currently transport and possibly can continue to transport if near center. SSN# [id number]  Bary Castilla, LCSW

## 2022-10-10 NOTE — Progress Notes (Signed)
Pt is still confused but his mental status was much more stable than yesterday. Pt was not agitated at all today and was cooperative with care. He had a few sleep apnea episodes with bradycardia (high 50's) while he was sleeping. Pt's respiration dropped below 10 while sleeping but we could bring respiration rate up to normal when I wake him up. Notified attending regarding Apneic episodes.

## 2022-10-10 NOTE — Progress Notes (Signed)
0000 BGL 69. Hypoglycemic standing orders initiated and per protocol, Orange juice given. Blood glucose rechecked and results lower than previously obtained @ '57mg'$ /dl. Additional Orange juice and applesauce given and BGL sees an improvement to '80mg'$ /dl. Will continue to monitor.

## 2022-10-10 NOTE — Progress Notes (Signed)
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  Off of Levophed.  Currently on midodrine with variable blood pressure.  # ESRD MWF: Plan for next dialysis tomorrow, had intradialytic hypotension last time. Currently has tunneled HD catheter for the access.  Recommend to minimize IV fluid administration, noted he is on dextrose for hypoglycemia.    # Acute hypoxic respiratory failure due to pneumonia: Improved and now on room air.     # Anemia of CKD: Hemoglobin at goal.  [facility] Rast as outpatient.  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    # Hyponatremia: This is exacerbated by hypotonic fluid/dextrose IV.  Recommend to minimize fluid.  Attempt UF as tolerated.  # Hypokalemia: HD with higher potassium bath.  # Acute metabolic encephalopathy: Somewhat more alert awake but is still having episodes of confusion.  Reorientation and supportive care, per primary team.  Subjective: Seen and examined.  He is moved out of ICU to the floor.  He was alert awake but he still having some delirium.  Noted he is on dextrose IV for hypoglycemia.  Review of system limited. Objective Vital signs in last 24 hours: Vitals:   10/09/22 1956 10/10/22 0031 10/10/22 0430 10/10/22 0738  BP:  (!) 94/55 (!) 92/59 114/69  Pulse:  71 61 65  Resp:  '17 17 19  '$ Temp:  98.2 F (36.8 C) 98.2 F (36.8 C) 98.2 F (36.8 C)  TempSrc:  Oral Oral Oral  SpO2: 100% 90% 100% 97%  Weight:   93.5 kg   Height:       Weight change: 0.3 kg  Intake/Output Summary (Last 24 hours) at 10/10/2022 0937 Last data filed at 10/10/2022 0309 Gross per 24 hour   Intake 535.83 ml  Output --  Net 535.83 ml        Labs: RENAL PANEL Recent Labs    03/31/22 0710 10/03/22 1450 10/03/22 2000 10/03/22 2302 10/04/22 0712 10/05/22 0430 10/06/22 0231 10/08/22 0417 10/08/22 1209 10/10/22 0446  NA 126* 127* 132*  --  130* 128* 127* 130* 127* 126*  K 3.9 3.4* 3.7  --  3.8 3.0* 3.5 3.0* 4.1 3.5  CL 92* 94*  --   --  92* 93* 91* 102 92* 92*  CO2  --  20*  --   --  '22 25 24 '$ 19* 21* 25  GLUCOSE 77 113*  --   --  70 85 126* 61* 64* 128*  BUN 31* 17  --   --  '17 10 16 12 16 13  '$ CREATININE 4.20* 4.77*  --  3.87* 4.76* 3.19* 3.86* 2.94* 3.99* 3.72*  CALCIUM  --  8.2*  --   --  8.2* 7.9* 8.2* 6.7* 8.7* 8.0*  MG  --   --   --  1.4* 2.1 1.8  --   --   --  1.6*  PHOS  --   --   --   --  4.8*  4.7*  --   --   --  2.5  --   ALBUMIN  --  2.4*  --   --   --   --   --   --  2.7* 2.3*      Liver Function Tests: Recent Labs  Lab 10/03/22 1450 10/08/22 1209 10/10/22 0446  AST 34  --  29  ALT 14  --  13  ALKPHOS 88  --  78  BILITOT 2.1*  --  1.0  PROT 6.3*  --  5.7*  ALBUMIN 2.4* 2.7* 2.3*    Recent Labs  Lab 10/03/22 1447  LIPASE 26    Recent Labs  Lab 10/03/22 1449  AMMONIA 34    CBC: Recent Labs    10/04/22 0712 10/05/22 0430 10/08/22 0417 10/08/22 1209 10/10/22 0446  HGB 11.7* 10.4* 10.5* 10.9* 10.4*  MCV 97.5 91.2 89.8 92.4 91.0  VITAMINB12  --   --   --   --  1,245*     Cardiac Enzymes: No results for input(s): "CKTOTAL", "CKMB", "CKMBINDEX", "TROPONINI" in the last 168 hours. CBG: Recent Labs  Lab 10/10/22 0024 10/10/22 0132 10/10/22 0206 10/10/22 0422 10/10/22 0731  GLUCAP 69* 57* 80 124* 93     Iron Studies: No results for input(s): "IRON", "TIBC", "TRANSFERRIN", "FERRITIN" in the last 72 hours. Studies/Results: No results found.  Medications: Infusions:  sodium chloride Stopped (10/08/22 2213)   dextrose 50 mL/hr at 10/10/22 0659   magnesium sulfate bolus IVPB Stopped (10/10/22 0805)    Scheduled  Medications:  arformoterol  15 mcg Nebulization BID   budesonide (PULMICORT) nebulizer solution  0.5 mg Nebulization BID   Chlorhexidine Gluconate Cloth  6 each Topical Q0600   Chlorhexidine Gluconate Cloth  6 each Topical Q0600   Chlorhexidine Gluconate Cloth  6 each Topical O2703   folic acid  1 mg Oral Daily   heparin  5,000 Units Subcutaneous Q8H   midodrine  20 mg Oral Q8H   multivitamin with minerals  1 tablet Oral Daily   mouth rinse  15 mL Mouth Rinse 4 times per day   QUEtiapine  25 mg Oral QHS   sodium chloride flush  10-40 mL Intracatheter Q12H   thiamine  100 mg Oral Daily    have reviewed scheduled and prn medications.  Physical Exam: General: Alert awake but delirious male, lying in bed comfortable. Heart:RRR, s1s2 nl Lungs: Clear bilateral.  No wheeze. Abdomen:soft, Non-tender, non-distended Extremities: No edema. Dialysis Access: RIJ TDC in place.  Mariame Rybolt Prasad Joniah Bednarski 10/10/2022,9:37 AM  LOS: 7 days

## 2022-10-10 NOTE — Plan of Care (Signed)
    Problem: Coping: Goal: Ability to adjust to condition or change in health will improve Outcome: Progressing   Problem: Education: Goal: Knowledge of General Education information will improve Description: Including pain rating scale, medication(s)/side effects and non-pharmacologic comfort measures Outcome: Progressing   

## 2022-10-10 NOTE — Progress Notes (Signed)
PROGRESS NOTE    ATA PECHA  GUR:[id number] DOB: 11-Apr-1956 DOA: 10/03/2022 PCP: Mateo Flow, MD    Brief Narrative:   Benjamin [name] is a 67 y.o. male with past medical history significant for ESRD on HD MWF, chronic systolic congestive heart failure (LVEF 20%), type 2 diabetes mellitus, history of EtOH abuse with cirrhosis, who presented to [facility] ED on 1/28 via EMS from home for altered mental status, slurred speech.  Patient has been having increased productive cough with yellow sputum.  He last received HD on 1/26 but did not finish due to shortness of breath.  He subsequently became increasingly altered and presented to the ED for further evaluation.  Upon arrival to the ED, patient was noted to be altered/confused. WBC 13.5, Hgb 12.1, platelets 66, troponin 59 then 62, ammonia 34, ethanol WNL, NA 127, K3.4, CO2 20, LA 3.4 then 3.6 then 3.2, BNP greater than 4500, COVID/flu/RSV negative, VBG 7.24, 53, 40, 23.  CBG noted to be in the 40s and was given dextrose with some improvement in his mental status.  Patient was notably hypoxic in the 80s on room air and was placed on nasal cannula.  His SBP was low in the 70-80s and hypothermic at 92.8 F.  Sepsis protocol was initiated, cultures obtained and was started on broad-spectrum antibiotics.  Patient was given 1.5 L of IV fluids and albumin.  Patient remained hypotensive despite aggressive resuscitation and was initiated on vasopressors.  CT chest/abdomen/pelvis with mild patchy opacities right middle lobe, cirrhosis with moderate ascites.  Diagnostic paracentesis performed by ED physician.  PCCM consulted and was initially admitted to the intensive care unit.  Significant Hospital events: 1/28: Admitted to ICU for septic shock from CAP, on Levophed drip 1/29: Received hemodialysis, 1 L removed, TTE with LVEF 40%, grade 2 DD, RV function mildly reduced, RV size mildly enlarged, PASP 38.6 mmHg, LAE, RAE 1/30: Titrated off of Levophed drip,  work with PT, seen by palliative care. 1/31: Remains off Levophed drip.  Received HD. 2/1: Transferred to hospitalist service. 2/2: Remains confused, recurrent hypoglycemic episodes, poor intake 2/3: Recurrent hypoglycemia, IV fluids increased to 75 mL/h but given ESRD patient changed to D10 gtt at 104m/h; remains confused 2/4: Confusion improved, continues with mild delirium, mild hypoglycemic event overnight, D10 increased to 50 mL/h.  Remains with poor oral intake  Assessment & Plan:   Acute metabolic encephalopathy Patient presenting to ED with confusion.  Was notably hypoxic with SpO2 in the 80s on room air secondary to pneumonia.  Also history of EtOH abuse, ammonia level 34 on admission with EtOH level within normal limits.  Also hypotensive likely contributing factor. -- Continue treatment as below  Septic shock, POA Community-acquired pneumonia Patient presenting to ED with confusion, hypoxia with elevated WBC count of 13.5, procalcitonin 3.28.  Lactic acid up to 3.6.  Systolic blood pressure on admission in the 70s-80s and was hypothermic at 92.8 F.  COVID-19/RSV/influenza A/B PCR negative.  CT chest/abdomen/pelvis with mild dependent patchy opacities in the lungs bilaterally favoring mild aspiration.  Completed 6-day course of azithromycin and 5-day course of ceftriaxone.  Levophed was titrated off and supplemental oxygen also was titrated off. --Continue to monitor SpO2  EtOH cirrhosis with ascites Patient underwent paracentesis by EDP on admission, SBP ruled out.  Ammonia level 34 on admission.  EtOH level within normal limits.  Last drink was reported 2 years ago. -- Continue volume management with hemodialysis. -- Folic acid, multivitamin, thiamine  Chronic  combined systolic/diastolic congestive heart failure Previous LVEF 20%.  TTE on 1/29 with improved LVEF to 40%, LV with moderately decreased function, LV with global hypokinesis, grade 2 diastolic dysfunction, RVSP 38.6  mmHg, biatrial enlargement, trivial MR, moderate aortic valve stenosis, moderate/severe TR, IVC dilated.  GDMT limited due to patient's hypotension -- Midodrine 20 mg p.o. every 8 hours  ESRD on HD MWF Metabolic acidosis --Nephrology following, appreciate assistance -- Continue HD per nephrology  Hypomagnesemia Magnesium 1.6, will replete. -- Repeat electrolytes in a.m.  Elevated troponin Troponin elevated 62 on admission with BNP elevated greater than 4500.  Etiology likely secondary to type II demand ischemia in the setting of septic shock and volume overload on admission.  Hx DM2 Hypoglycemia Hemoglobin A1c 4.6 on 10/03/2022.  Diet controlled at baseline.  Patient with recurrent hypoglycemic events during hospitalization, likely secondary to poor oral intake.  Cortisol level slightly low at 6.3. --D10 gtt at 71m/h --Continue to encourage increased oral intake --CBGs every 4 hours --Cosyntropin stimulation test: Pending -- Calorie count ordered  Hallucinations Patient with active visual/auditory hallucinations in the ICU, improved today; although continues with delirium.  Unclear etiology as his pneumonia has been treated, now oxygenating well on room air.  Continues to have mild hypoglycemic episodes.  Unclear etiology.  History of EtOH abuse.  Unlikely to tolerate MRI brain at this time.  Suspect related to hospital-acquired delirium/delirium of critical illness. -- Started Seroquel 25 mg p.o. nightly -- If no improvement, may need to consider CT head versus attempted MR brain and psychiatry consultation -- Repeat ammonia level: pending -- Treating hypoglycemia as above -- Supportive care  Weakness/debility/deconditioning/gait disturbance: -- PT/OT recommend SNF placement -- TOC for assistance  Goals of care: -- Palliative care following   DVT prophylaxis: heparin injection 5,000 Units Start: 10/03/22 2200    Code Status: Full Code Family Communication: Updated family  present at bedside this morning  Disposition Plan:  Level of care: Progressive Status is: Inpatient Remains inpatient appropriate because: Continues with hypoglycemia on IV dextrose drip, will eventually need SNF placement    Consultants:  PCCM -signed off 2/1 Nephrology Palliative care  Procedures:  Paracentesis 1/28  Antimicrobials:  Vancomycin 1/28 - 1/28 Metronidazole 1/28 - 1/28 Ceftriaxone 1/28 - 2/1 Cefepime 1/28 - 1/28 Azithromycin 1/28 - 2/2   Subjective: Patient seen examined at bedside, lying in bed.  Continues with confusion, although improved since yesterday.  Knows that he is in the hospital and that the president's name is Biden.  Unable to tell me the year.  Updated family present at bedside this morning.  States he is not hungry.  Discussed with RN.  Unable to obtain any further ROS due to his continued confusion.  Slight hypoglycemia overnight with glucose 57, Rx juice and applesauce was given with improvement of glucose to 80.  No other concerns overnight per nursing staff.  Objective: Vitals:   10/09/22 1956 10/10/22 0031 10/10/22 0430 10/10/22 0738  BP:  (!) 94/55 (!) 92/59 114/69  Pulse:  71 61 65  Resp:  '17 17 19  '$ Temp:  98.2 F (36.8 C) 98.2 F (36.8 C) 98.2 F (36.8 C)  TempSrc:  Oral Oral Oral  SpO2: 100% 90% 100% 97%  Weight:   93.5 kg   Height:        Intake/Output Summary (Last 24 hours) at 10/10/2022 0958 Last data filed at 10/10/2022 0309 Gross per 24 hour  Intake 535.83 ml  Output --  Net 535.83 ml  Filed Weights   10/09/22 0416 10/09/22 1529 10/10/22 0430  Weight: 93.7 kg 94 kg 93.5 kg    Examination:  Physical Exam: GEN: NAD, alert, confused but oriented to place (hospital) and person Airline pilot), chronically ill appearance, appears older than stated age 41: NCAT, PERRL, EOMI, sclera clear, MMM PULM: CTAB w/o wheezes/crackles, normal respiratory effort, on room air CV: RRR w/o M/G/R, left IJ central line noted,  tunneled HD catheter noted right chest GI: abd soft, NTND, NABS, no R/G/M MSK: Trace bilateral lower extremity peripheral edema, moves all extremities independently NEURO: No focal deficits appreciated, confused PSYCH: Intermittent confusion, no apparent hallucinations this morning Integumentary: Several areas of ecchymosis noted to extremities in various stages of healing, no other concerning rashes/lesions/wounds noted on exposed skin surfaces    Data Reviewed: I have personally reviewed following labs and imaging studies  CBC: Recent Labs  Lab 10/03/22 1447 10/03/22 2000 10/04/22 0712 10/05/22 0430 10/08/22 0417 10/08/22 1209 10/10/22 0446  WBC 13.5*   < > 19.3* 11.4* 6.4 6.4 5.9  NEUTROABS 12.0*  --   --   --   --   --   --   HGB 12.1*   < > 11.7* 10.4* 10.5* 10.9* 10.4*  HCT 38.6*   < > 38.6* 32.0* 31.6* 33.9* 32.3*  MCV 94.8   < > 97.5 91.2 89.8 92.4 91.0  PLT 66*   < > 75* 82* 57* 63* 71*   < > = values in this interval not displayed.   Basic Metabolic Panel: Recent Labs  Lab 10/03/22 2302 10/04/22 0712 10/05/22 0430 10/06/22 0231 10/08/22 0417 10/08/22 1209 10/10/22 0446  NA  --  130* 128* 127* 130* 127* 126*  K  --  3.8 3.0* 3.5 3.0* 4.1 3.5  CL  --  92* 93* 91* 102 92* 92*  CO2  --  '22 25 24 '$ 19* 21* 25  GLUCOSE  --  70 85 126* 61* 64* 128*  BUN  --  '17 10 16 12 16 13  '$ CREATININE 3.87* 4.76* 3.19* 3.86* 2.94* 3.99* 3.72*  CALCIUM  --  8.2* 7.9* 8.2* 6.7* 8.7* 8.0*  MG 1.4* 2.1 1.8  --   --   --  1.6*  PHOS  --  4.8*  4.7*  --   --   --  2.5  --    GFR: Estimated Creatinine Clearance: 23.5 mL/min (A) (by C-G formula based on SCr of 3.72 mg/dL (H)). Liver Function Tests: Recent Labs  Lab 10/03/22 1450 10/08/22 1209 10/10/22 0446  AST 34  --  29  ALT 14  --  13  ALKPHOS 88  --  78  BILITOT 2.1*  --  1.0  PROT 6.3*  --  5.7*  ALBUMIN 2.4* 2.7* 2.3*   Recent Labs  Lab 10/03/22 1447  LIPASE 26   Recent Labs  Lab 10/03/22 1449  AMMONIA 34    Coagulation Profile: Recent Labs  Lab 10/03/22 1447  INR 1.7*   Cardiac Enzymes: No results for input(s): "CKTOTAL", "CKMB", "CKMBINDEX", "TROPONINI" in the last 168 hours. BNP (last 3 results) No results for input(s): "PROBNP" in the last 8760 hours. HbA1C: No results for input(s): "HGBA1C" in the last 72 hours. CBG: Recent Labs  Lab 10/10/22 0024 10/10/22 0132 10/10/22 0206 10/10/22 0422 10/10/22 0731  GLUCAP 69* 57* 80 124* 93   Lipid Profile: No results for input(s): "CHOL", "HDL", "LDLCALC", "TRIG", "CHOLHDL", "LDLDIRECT" in the last 72 hours. Thyroid Function Tests: No results  for input(s): "TSH", "T4TOTAL", "FREET4", "T3FREE", "THYROIDAB" in the last 72 hours. Anemia Panel: Recent Labs    10/10/22 0446  VITAMINB12 1,245*   Sepsis Labs: Recent Labs  Lab 10/03/22 1704 10/03/22 1955 10/03/22 2302 10/04/22 0246 10/10/22 0446  PROCALCITON  --   --  3.28  --  0.85  LATICACIDVEN 3.6* 3.2* 2.5* 2.0*  --     Recent Results (from the past 240 hour(s))  Blood Culture (routine x 2)     Status: None   Collection Time: 10/03/22  3:08 PM   Specimen: BLOOD  Result Value Ref Range Status   Specimen Description BLOOD RIGHT ANTECUBITAL  Final   Special Requests   Final    BOTTLES DRAWN AEROBIC AND ANAEROBIC Blood Culture results may not be optimal due to an inadequate volume of blood received in culture bottles   Culture   Final    NO GROWTH 5 DAYS Performed at [facility], [place] [address], [place], [place] [address]    Report Status 10/08/2022 FINAL  Final  Resp panel by RT-PCR (RSV, Flu A&B, Covid) Anterior Nasal Swab     Status: None   Collection Time: 10/03/22  3:13 PM   Specimen: Anterior Nasal Swab  Result Value Ref Range Status   SARS Coronavirus 2 by RT PCR NEGATIVE NEGATIVE Final    Comment: (NOTE) SARS-CoV-2 target nucleic acids are NOT DETECTED.  The SARS-CoV-2 RNA is generally detectable in upper respiratory specimens during the acute phase  of infection. The lowest concentration of SARS-CoV-2 viral copies this assay can detect is 138 copies/mL. A negative result does not preclude SARS-Cov-2 infection and should not be used as the sole basis for treatment or other patient management decisions. A negative result may occur with  improper specimen collection/handling, submission of specimen other than nasopharyngeal swab, presence of viral mutation(s) within the areas targeted by this assay, and inadequate number of viral copies(<138 copies/mL). A negative result must be combined with clinical observations, patient history, and epidemiological information. The expected result is Negative.  Fact Sheet for Patients:  EntrepreneurPulse.com.au  Fact Sheet for Healthcare Providers:  IncredibleEmployment.be  This test is no t yet approved or cleared by the [place] FDA and  has been authorized for detection and/or diagnosis of SARS-CoV-2 by FDA under an Emergency Use Authorization (EUA). This EUA will remain  in effect (meaning this test can be used) for the duration of the COVID-19 declaration under Section 564(b)(1) of the Act, 21 U.S.C.section 360bbb-3(b)(1), unless the authorization is terminated  or revoked sooner.       Influenza A by PCR NEGATIVE NEGATIVE Final   Influenza B by PCR NEGATIVE NEGATIVE Final    Comment: (NOTE) The Xpert Xpress SARS-CoV-2/FLU/RSV plus assay is intended as an aid in the diagnosis of influenza from Nasopharyngeal swab specimens and should not be used as a sole basis for treatment. Nasal washings and aspirates are unacceptable for Xpert Xpress SARS-CoV-2/FLU/RSV testing.  Fact Sheet for Patients: EntrepreneurPulse.com.au  Fact Sheet for Healthcare Providers: IncredibleEmployment.be  This test is not yet approved or cleared by the [place] FDA and has been authorized for detection and/or diagnosis of  SARS-CoV-2 by FDA under an Emergency Use Authorization (EUA). This EUA will remain in effect (meaning this test can be used) for the duration of the COVID-19 declaration under Section 564(b)(1) of the Act, 21 U.S.C. section 360bbb-3(b)(1), unless the authorization is terminated or revoked.     Resp Syncytial Virus by PCR NEGATIVE  NEGATIVE Final    Comment: (NOTE) Fact Sheet for Patients: EntrepreneurPulse.com.au  Fact Sheet for Healthcare Providers: IncredibleEmployment.be  This test is not yet approved or cleared by the [place] FDA and has been authorized for detection and/or diagnosis of SARS-CoV-2 by FDA under an Emergency Use Authorization (EUA). This EUA will remain in effect (meaning this test can be used) for the duration of the COVID-19 declaration under Section 564(b)(1) of the Act, 21 U.S.C. section 360bbb-3(b)(1), unless the authorization is terminated or revoked.  Performed at [facility], [place] [address] [place], [place] [address]   Blood Culture (routine x 2)     Status: None   Collection Time: 10/03/22  3:13 PM   Specimen: BLOOD RIGHT FOREARM  Result Value Ref Range Status   Specimen Description BLOOD RIGHT FOREARM  Final   Special Requests   Final    BOTTLES DRAWN AEROBIC AND ANAEROBIC Blood Culture results may not be optimal due to an inadequate volume of blood received in culture bottles   Culture   Final    NO GROWTH 5 DAYS Performed at [facility], [place] [address] [place], [place] [address]    Report Status 10/08/2022 FINAL  Final  Body fluid culture w Gram Stain     Status: None   Collection Time: 10/03/22  4:20 PM   Specimen: Peritoneal Cavity; Peritoneal Fluid  Result Value Ref Range Status   Specimen Description PERITONEAL CAVITY  Final   Special Requests NONE  Final   Gram Stain   Final    RARE WBC PRESENT,BOTH PMN AND MONONUCLEAR NO ORGANISMS SEEN    Culture   Final    NO GROWTH 3  DAYS Performed at [facility], [place] [address] [place], [place] [address]    Report Status 10/07/2022 FINAL  Final  MRSA Next Gen by PCR, Nasal     Status: Abnormal   Collection Time: 10/03/22 10:35 PM  Result Value Ref Range Status   MRSA by PCR Next Gen DETECTED (A) NOT DETECTED Final    Comment: RESULT CALLED TO, READ BACK BY AND VERIFIED WITH: LILLY RN 10/04/22 '@0001'$  BY AB (NOTE) The GeneXpert MRSA Assay (FDA approved for NASAL specimens only), is one component of a comprehensive MRSA colonization surveillance program. It is not intended to diagnose MRSA infection nor to guide or monitor treatment for MRSA infections. Test performance is not FDA approved in patients less than 58 years old. Performed at [facility], [place] [address] [place],  [address]   Respiratory (~20 pathogens) panel by PCR     Status: None   Collection Time: 10/04/22 11:49 AM   Specimen: Nasopharyngeal Swab; Respiratory  Result Value Ref Range Status   Adenovirus NOT DETECTED NOT DETECTED Final   Coronavirus 229E NOT DETECTED NOT DETECTED Final    Comment: (NOTE) The Coronavirus on the Respiratory Panel, DOES NOT test for the novel  Coronavirus (2019 nCoV)    Coronavirus HKU1 NOT DETECTED NOT DETECTED Final   Coronavirus NL63 NOT DETECTED NOT DETECTED Final   Coronavirus OC43 NOT DETECTED NOT DETECTED Final   Metapneumovirus NOT DETECTED NOT DETECTED Final   Rhinovirus / Enterovirus NOT DETECTED NOT DETECTED Final   Influenza A NOT DETECTED NOT DETECTED Final   Influenza B NOT DETECTED NOT DETECTED Final   Parainfluenza Virus 1 NOT DETECTED NOT DETECTED Final   Parainfluenza Virus 2 NOT DETECTED NOT DETECTED Final   Parainfluenza Virus 3 NOT DETECTED NOT  DETECTED Final   Parainfluenza Virus 4 NOT DETECTED NOT DETECTED Final   Respiratory Syncytial Virus NOT DETECTED NOT DETECTED Final   Bordetella pertussis NOT DETECTED NOT DETECTED Final   Bordetella Parapertussis NOT DETECTED NOT  DETECTED Final   Chlamydophila pneumoniae NOT DETECTED NOT DETECTED Final   Mycoplasma pneumoniae NOT DETECTED NOT DETECTED Final    Comment: Performed at [facility], [place] [address], [place], [place] [address]         Radiology Studies: No results found.      Scheduled Meds:  arformoterol  15 mcg Nebulization BID   budesonide (PULMICORT) nebulizer solution  0.5 mg Nebulization BID   Chlorhexidine Gluconate Cloth  6 each Topical Q0600   Chlorhexidine Gluconate Cloth  6 each Topical Q0600   Chlorhexidine Gluconate Cloth  6 each Topical Q0600   Chlorhexidine Gluconate Cloth  6 each Topical A2130   folic acid  1 mg Oral Daily   heparin  5,000 Units Subcutaneous Q8H   midodrine  20 mg Oral Q8H   multivitamin with minerals  1 tablet Oral Daily   mouth rinse  15 mL Mouth Rinse 4 times per day   QUEtiapine  25 mg Oral QHS   sodium chloride flush  10-40 mL Intracatheter Q12H   thiamine  100 mg Oral Daily   Continuous Infusions:  sodium chloride Stopped (10/08/22 2213)   dextrose 50 mL/hr at 10/10/22 0659   magnesium sulfate bolus IVPB Stopped (10/10/22 0805)     LOS: 7 days    Time spent: 50 minutes spent on chart review, discussion with nursing staff, consultants, updating family and interview/physical exam; more than 50% of that time was spent in counseling and/or coordination of care.    Erice Ahles J [place] DO [facility] Available via Epic secure chat 7am-7pm After these hours, please refer to coverage provider listed on [email or web address] 10/10/2022, 9:58 AM

## 2022-10-10 NOTE — TOC Progression Note (Signed)
Transition of Care [facility]) - Progression Note    Patient Details  Name: Benjamin [name] MRN: [id number] Date of Birth: 1955-11-01  Transition of [facility]) CM/SW [place], LCSW Phone Number: 10/10/2022, 9:32 AM  Clinical Narrative:     CSW spoke with pt's son Benjamin [name] [facility]) I regards to pt's SNF recommendation. Benjamin [name] was aware of the recommendation and in agreement. Benjamin [name] shared that his 2 top choice was [facility] or [facility]. He also mentioned [facility]. CSW confirmed that pt is currently receiving HD at [facility] MWF. Benjamin [name] confirmed that family is taking pt now and could still transport if facility was close to center. CSW explained the SNF process and directed him to the medicare.gov to review ratings.  TOC team will continue to assist with discharge planning needs.    Expected Discharge Plan: [place] Barriers to Discharge: Continued Medical Work up  Expected Discharge Plan and Services   Discharge Planning Services: CM Consult [name] Acute Care Choice: [place] arrangements for the past 2 months: Single Family Home                           HH Arranged: PT, OT, Nurse's Aide [place] Agency: [place]         Social Determinants of Health (SDOH) Interventions SDOH Screenings   Tobacco Use: Medium Risk (10/03/2022)    Readmission Risk Interventions   No data to display

## 2022-10-10 NOTE — Progress Notes (Signed)
NUTRITION NOTE RD working remotely.  Consult received for Calorie Count. Calorie Count initiated and RN alerted via secure chat. RD to follow-up for full assessment and day #1 results on 2/5.      Jarome Matin, MS, RD, LDN, CNSC Clinical Dietitian PRN/Relief staff On-call/weekend pager # available in [facility]

## 2022-10-11 DIAGNOSIS — Z7189 Other specified counseling: Secondary | ICD-10-CM

## 2022-10-11 LAB — CBC
HCT: 32.8 % — ABNORMAL LOW (ref 39.0–52.0)
Hemoglobin: 10.5 g/dL — ABNORMAL LOW (ref 13.0–17.0)
MCH: 29.8 pg (ref 26.0–34.0)
MCHC: 32 g/dL (ref 30.0–36.0)
MCV: 93.2 fL (ref 80.0–100.0)
Platelets: 75 10*3/uL — ABNORMAL LOW (ref 150–400)
RBC: 3.52 MIL/uL — ABNORMAL LOW (ref 4.22–5.81)
RDW: 17.8 % — ABNORMAL HIGH (ref 11.5–15.5)
WBC: 4.9 10*3/uL (ref 4.0–10.5)
nRBC: 0 % (ref 0.0–0.2)

## 2022-10-11 LAB — GLUCOSE, CAPILLARY
Glucose-Capillary: 118 mg/dL — ABNORMAL HIGH (ref 70–99)
Glucose-Capillary: 122 mg/dL — ABNORMAL HIGH (ref 70–99)
Glucose-Capillary: 135 mg/dL — ABNORMAL HIGH (ref 70–99)
Glucose-Capillary: 140 mg/dL — ABNORMAL HIGH (ref 70–99)
Glucose-Capillary: 91 mg/dL (ref 70–99)
Glucose-Capillary: 98 mg/dL (ref 70–99)

## 2022-10-11 LAB — RENAL FUNCTION PANEL
Albumin: 2.5 g/dL — ABNORMAL LOW (ref 3.5–5.0)
Anion gap: 9 (ref 5–15)
BUN: 16 mg/dL (ref 8–23)
CO2: 25 mmol/L (ref 22–32)
Calcium: 8.1 mg/dL — ABNORMAL LOW (ref 8.9–10.3)
Chloride: 89 mmol/L — ABNORMAL LOW (ref 98–111)
Creatinine, Ser: 4.13 mg/dL — ABNORMAL HIGH (ref 0.61–1.24)
GFR, Estimated: 15 mL/min — ABNORMAL LOW (ref >60)
Glucose, Bld: 131 mg/dL — ABNORMAL HIGH (ref 70–99)
Phosphorus: 3 mg/dL (ref 2.5–4.6)
Potassium: 3.5 mmol/L (ref 3.5–5.1)
Sodium: 123 mmol/L — ABNORMAL LOW (ref 135–145)

## 2022-10-11 LAB — MAGNESIUM: Magnesium: 1.9 mg/dL (ref 1.7–2.4)

## 2022-10-11 MED ORDER — MIDODRINE HCL 5 MG PO TABS
10.0000 mg | ORAL_TABLET | Freq: Once | ORAL | Status: AC
  Administered 2022-10-11: 10 mg via ORAL

## 2022-10-11 MED ORDER — ENSURE ENLIVE PO LIQD
237.0000 mL | Freq: Two times a day (BID) | ORAL | Status: DC
  Administered 2022-10-11 – 2022-10-12 (×3): 237 mL via ORAL

## 2022-10-11 MED ORDER — HEPARIN SODIUM (PORCINE) 1000 UNIT/ML IJ SOLN
INTRAMUSCULAR | Status: AC
  Administered 2022-10-11: 1.9 [IU] via INTRAVENOUS_CENTRAL
  Filled 2022-10-11: qty 2

## 2022-10-11 MED ORDER — HEPARIN SODIUM (PORCINE) 1000 UNIT/ML IJ SOLN
INTRAMUSCULAR | Status: AC
  Administered 2022-10-11: 1.8 mL via INTRAVENOUS_CENTRAL
  Filled 2022-10-11: qty 1

## 2022-10-11 MED ORDER — ALBUMIN HUMAN 25 % IV SOLN
INTRAVENOUS | Status: AC
  Filled 2022-10-11: qty 100

## 2022-10-11 MED ORDER — ALBUMIN HUMAN 25 % IV SOLN
25.0000 g | Freq: Once | INTRAVENOUS | Status: AC
  Administered 2022-10-11: 25 g via INTRAVENOUS

## 2022-10-11 NOTE — Progress Notes (Signed)
Patient resting quietly, respirations even and unlabored. No signs of distress.

## 2022-10-11 NOTE — Progress Notes (Signed)
Mididrine '10mg'$  po given as well as Albumin 25%--25grams iv given per dx order---pt is pleasantly confused but is following commands ---will continue to monitor

## 2022-10-11 NOTE — Progress Notes (Signed)
[facility] NEPHROLOGY PROGRESS NOTE  Subjective: Seen and examined in HD unit. Has no c/o's.   Objective Vitals:   10/11/22 1200 10/11/22 1221 10/11/22 1230 10/11/22 1235  BP: (!) '82/62 99/62 95/61 '$ (!) 87/57  Pulse: (!) 58 (!) 58 (!) 58 (!) 59  Resp: '14 11 11 12  '$ Temp:   98.7 F (37.1 C)   TempSrc:      SpO2:   96% 97%  Weight:   93.2 kg   Height:       Physical Exam: General: Alert awake, lying in bed comfortable. Heart:RRR, s1s2 nl Lungs: Clear bilateral.  No wheeze. Abdomen:soft, Non-tender, non-distended, 2-3+ ascites Extremities: No edema. Dialysis Access: RIJ TDC in place.   OP HD: [facility] MWF 4h  400/600  86.9kg  3K/2.5Ca bath  TDC  RIJ + LUA AVF  Heparin none - last HD 1/26, post wt 88.3kg   - soft BP's at HD in 90s for last 2 wks at minimum - venofer '50mg'$  weekly - mircera 30 mcg q4 wks, last on 1/15, due 2/12    Assessment/ Plan: # Septic shock w/ acute pneumonia - completed ceftriaxone and azithromycin.  Off of pressors and out of ICU. Currently on midodrine with variable blood pressure.  # ESRD - on HD MWF. HD today. TDC is current access.    # Acute hypoxic respiratory failure due to pneumonia: Improved and now on room air.     # Anemia of CKD: Hemoglobin at goal.  [facility] as outpatient.  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    # Hyponatremia: This is exacerbated by hypotonic fluid/dextrose IV.  Recommend to minimize fluid.  Attempt UF as tolerated.  # Acute metabolic encephalopathy: Somewhat more alert awake.  Reorientation and supportive care, per primary team.  Kelly Splinter, MD 10/11/2022, 2:45 PM  Recent Labs  Lab 10/08/22 1209 10/10/22 0446 10/11/22 0500  HGB 10.9* 10.4* 10.5*  ALBUMIN 2.7* 2.3* 2.5*  CALCIUM 8.7* 8.0* 8.1*  PHOS 2.5  --  3.0  CREATININE 3.99* 3.72* 4.13*  K 4.1 3.5 3.5    Inpatient medications:  arformoterol  15 mcg Nebulization BID   budesonide (PULMICORT) nebulizer  solution  0.5 mg Nebulization BID   Chlorhexidine Gluconate Cloth  6 each Topical R0076   folic acid  1 mg Oral Daily   heparin  5,000 Units Subcutaneous Q8H   midodrine  20 mg Oral Q8H   multivitamin with minerals  1 tablet Oral Daily   mouth rinse  15 mL Mouth Rinse 4 times per day   QUEtiapine  25 mg Oral QHS   sodium chloride flush  10-40 mL Intracatheter Q12H   thiamine  100 mg Oral Daily    sodium chloride Stopped (10/08/22 2213)   dextrose 25 mL/hr at 10/11/22 1325   magnesium sulfate bolus IVPB Stopped (10/10/22 0805)   acetaminophen, docusate sodium, haloperidol lactate, hydrOXYzine, ipratropium-albuterol, polyethylene glycol

## 2022-10-11 NOTE — Progress Notes (Signed)
OT Cancellation Note  Patient Details Name: Benjamin [name] MRN: [id number] DOB: 10/19/1955   Cancelled Treatment:    Reason Eval/Treat Not Completed: Patient at procedure or test/ unavailable Off unit for HD.  Layla Maw 10/11/2022, 8:10 AM

## 2022-10-11 NOTE — Care Management Important Message (Signed)
Important Message  Patient Details  Name: Benjamin [name] MRN: [id number] Date of Birth: Oct 01, 1955   Medicare Important Message Given:  Yes     Shelda Altes 10/11/2022, 10:17 AM

## 2022-10-11 NOTE — Plan of Care (Signed)
  Problem: Fluid Volume: Goal: Ability to maintain a balanced intake and output will improve Outcome: Progressing   

## 2022-10-11 NOTE — Progress Notes (Signed)
PT Cancellation Note  Patient Details Name: Benjamin [name] MRN: [id number] DOB: June 30, 1956   Cancelled Treatment:    Reason Eval/Treat Not Completed: Other (comment).  Gone to HD, then eating a meal and then refused therapy.  Retry tomorrow.   Ramond Dial 10/11/2022, 2:35 PM  Mee Hives, PT PhD Acute Rehab Dept. Number: [place] [facility] [place]

## 2022-10-11 NOTE — Progress Notes (Signed)
Daily Progress Note   Patient Name: Benjamin [name]       Date: 10/11/2022 DOB: [birth year]  Age: 67 y.o. MRN#: [id number] Attending Physician: [place] Benjamin Aldaba J, DO Primary Care Physician: Benjamin Flow, MD Admit Date: 10/03/2022 Length of Stay: 8 days  Reason for Consultation/Follow-up: Establishing goals of care  HPI/Patient Profile:  67 y.o. male  with past medical history of EtOH abuse, cirrhosis, ESRD ([facility]), advanced heart failure (EF 20%), and type 2 diabetes admitted on 10/03/2022 with AMS.   Patient is being treated for septic shock related to R ML space CAP (ceftriaxone and Zithromax), ARF, hypokalemia, and hypoglycemia.  Course of hospitalization has required use of Levophed drip, but it is currently remaining off with blood pressure soft.  He remains confused with recurrent hypoglycemic episodes.  Intake continues to remain remain poor.   PMT was consulted to discuss goals of care  Subjective:   Subjective: Chart Reviewed. Updates received. Patient Assessed. Created space and opportunity for patient  and family to explore thoughts and feelings regarding current medical situation.  Today's Discussion: Today I saw the patient at bedside.  Is awake, alert, oriented x 3 she is knows his name (, he is at [facility], and it is 2024).  We discussed that he had hemodialysis today and he feels it went well.  However, he has been having some diarrhea from it, which is new for him.  I shared that based on my reading of previous notes it appears he is doing better today.  I confirmed who he would want his healthcare decision-maker to be if he could not make his decisions and he states his son Benjamin [name].  We had a discussion on goals and he feels his primary goal is to live as long as possible unless he is in a state such as, a vegetative state where he is unlikely to come out of it.  At that point it would be okay to "let him go".  I asked if he has talked to [place] about this and he  states now.  I recommended that he do discuss his wishes with Benjamin [name] so he is aware and he says that he will.  We discussed his appetite.  He states that he does not think he has been eating enough because "they keep coming in every couple hours to give me more food."  Overall he feels the food has been a bit dry which is limited his ability to swallow.  We discussed ordering off the menu and finding things that may be easier or more appealing to him.  Additionally, I offered that the nursing staff has snacks such as Ensure supplements, ice cream, etc. can be offered.  I encouraged him to "eat to live" and discussed nutrition as necessary for physical recovery.  We discussed the plan to eventually discharge to skilled nursing facility/rehab.  I asked if he is okay with that and he states that "if that is what is needed then I will do it."  He seems to be committed to recovery.  I provided emotional and general support through therapeutic listening, empathy, sharing of stories, and other techniques. I answered all questions and addressed all concerns to the best of my ability.  Review of Systems  Constitutional:  Positive for appetite change (Decreased, but tryign to improve).  Gastrointestinal:  Positive for diarrhea. Negative for nausea and vomiting.    Objective:   Vital Signs:  BP 91/68   Pulse (!) 55  Temp 98.7 F (37.1 C)   Resp 15   Ht 6' (1.829 m)   Wt 93.2 kg   SpO2 100%   BMI 27.87 kg/m   Physical Exam: Physical Exam Vitals and nursing note reviewed.  Constitutional:      General: He is not in acute distress.    Appearance: He is ill-appearing.  HENT:     Head: Normocephalic and atraumatic.  Cardiovascular:     Rate and Rhythm: Normal rate.     Heart sounds: Murmur heard.     Systolic murmur is present with a grade of 3/6.  Skin:    General: Skin is warm and dry.  Neurological:     Mental Status: He is alert and oriented to person, place, and time.     Comments:  Some slight delay in processing/responding     Palliative Assessment/Data: 20%    Existing Vynca/ACP Documentation: None  Assessment & Plan:   Impression: Present on Admission:  Septic shock ([place])  SUMMARY OF RECOMMENDATIONS   Full code Full scope of care Agreeable to SNF discharge for rehab Encourage discussion of wishes with son/healthcare surrogate PMT will continue to follow  Symptom Management:  Per primary team PMT is available to assist as needed  Code Status: Full code  Prognosis: Unable to determine  Discharge Planning: To Be Determined  Discussed with: Patient, medica, team, nursing team.  Thank you for allowing [place] to participate in the care of Benjamin [name] PMT will continue to support holistically.  Time Total: 60 min  Visit consisted of counseling and education dealing with the complex and emotionally intense issues of symptom management and palliative care in the setting of serious and potentially life-threatening illness. Greater than 50%  of this time was spent counseling and coordinating care related to the above assessment and plan.  Benjamin Field, NP Palliative Medicine Team  Team Phone # [phone] (Nights/Weekends)  05/05/2021, 8:17 AM

## 2022-10-11 NOTE — Progress Notes (Signed)
Calorie Count Note- Day 1  48 hour calorie count ordered.  Diet: Heart Healthy, 1254m fluid restriction Supplements: none ordered  Lunch: 391 kcal, 21g protein Dinner: 306kcal, 5g protein Breakfast: 0 kcal (missed d/t HD) Supplements: n/a  Total intake: 697 kcal (32% of minimum estimated needs)  26 protein (23% of minimum estimated needs)  Intervention:  Ensure Enlive po BID, each supplement provides 350 kcal and 20 grams of protein. 8P snack (PB&J, fruit up, jello, cookies)  Estimated Needs: Kcal: 2200-2400 Protein: 115-130g Fluid: 1L + UOP  Will follow up tomorrow for day 2 calorie count. Full RD assessment to follow.   AClayborne Dana RDN, LDN Clinical Nutrition

## 2022-10-11 NOTE — Consult Note (Signed)
   [facility] CM Inpatient Consult   10/11/2022  CORRELL DENBOW [birth year] [id number]  [facility] [[facility]] Patient: Medicare ACO REACH  Primary Care Provider:  Mateo Flow, MD with [facility] who is listed to provide the transition of care follow up  Patient screened for hospitalization with noted high risk score for unplanned readmission risk with a 7 day length of stay and to assess for potential [place] Management service needs for post hospital transition for care coordination.  Review of patient's electronic medical record reveals patient is being recommended for a skilled nursing facility level of care for post hospital for short term rehab.  Review reveals patient has some confusion.  Plan:  Patient was in HD. Continue to follow progress and disposition to assess for post hospital community care coordination/management needs.  Referral request for community care coordination: if patient goes to a [facility] affiliated facility will alert [facility] RN for post SNF community care coordination needs for readmission prevention.  Of note, [facility] Care Management/Population Health does not replace or interfere with any arrangements made by the Inpatient Transition of Care team.  For questions contact:   Natividad Brood, RN BSN [place]  [phone] business mobile phone Toll free office [phone]  *[place]  [phone] Fax number: [phone] [email or web address]

## 2022-10-11 NOTE — Progress Notes (Signed)
Chart reviewed and pt for possible snf placement. Contacted by pt's out-pt HD clinic with pt's schedule. Pt receives out-pt HD at [facility] on MWF with 11:45 am chair time. Info provided to CSW for snf placement purposes. Will assist as needed.   Melven Sartorius Renal Navigator [phone]

## 2022-10-11 NOTE — Progress Notes (Signed)
   10/11/22 1230  Vitals  Temp 98.7 F (37.1 C)  Pulse Rate (!) 58  Resp 11  BP 95/61  SpO2 96 %  O2 Device Nasal Cannula  Weight 93.2 kg  Type of Weight Post-Dialysis  Oxygen Therapy  O2 Flow Rate (L/min) 2 L/min  Patient Activity (if Appropriate) In bed  Post Treatment  Dialyzer Clearance Lightly streaked  Duration of HD Treatment -hour(s) 4 hour(s)  Hemodialysis Intake (mL) 0 mL  Liters Processed 96  Fluid Removed (mL) 2000 mL  Tolerated HD Treatment Yes   Received patient in bed to unit.  Alert and oriented.  Informed consent signed and in chart.   TX duration:4 hrs  Patient tolerated well.  Transported back to the room  Alert, without acute distress.  Hand-off given to patient's nurse.   Access used: Right DLC Access issues: none  Total UF removed: 2.0 L Medication(s) given: Mididrine 10 mg po x 1 at onset of tx and albumin 25 grams IV at onset of the tx      [facility]

## 2022-10-11 NOTE — Progress Notes (Addendum)
PROGRESS NOTE    ZOE NORDIN  TZG:[id number] DOB: 1956-04-04 DOA: 10/03/2022 PCP: Mateo Flow, MD    Brief Narrative:   Benjamin [name] is a 67 y.o. male with past medical history significant for ESRD on HD MWF, chronic systolic congestive heart failure (LVEF 20%), type 2 diabetes mellitus, history of EtOH abuse with cirrhosis, who presented to [facility] ED on 1/28 via EMS from home for altered mental status, slurred speech.  Patient has been having increased productive cough with yellow sputum.  He last received HD on 1/26 but did not finish due to shortness of breath.  He subsequently became increasingly altered and presented to the ED for further evaluation.  Upon arrival to the ED, patient was noted to be altered/confused. WBC 13.5, Hgb 12.1, platelets 66, troponin 59 then 62, ammonia 34, ethanol WNL, NA 127, K3.4, CO2 20, LA 3.4 then 3.6 then 3.2, BNP greater than 4500, COVID/flu/RSV negative, VBG 7.24, 53, 40, 23.  CBG noted to be in the 40s and was given dextrose with some improvement in his mental status.  Patient was notably hypoxic in the 80s on room air and was placed on nasal cannula.  His SBP was low in the 70-80s and hypothermic at 92.8 F.  Sepsis protocol was initiated, cultures obtained and was started on broad-spectrum antibiotics.  Patient was given 1.5 L of IV fluids and albumin.  Patient remained hypotensive despite aggressive resuscitation and was initiated on vasopressors.  CT chest/abdomen/pelvis with mild patchy opacities right middle lobe, cirrhosis with moderate ascites.  Diagnostic paracentesis performed by ED physician.  PCCM consulted and was initially admitted to the intensive care unit.  Significant Hospital events: 1/28: Admitted to ICU for septic shock from CAP, on Levophed drip 1/29: Received hemodialysis, 1 L removed, TTE with LVEF 40%, grade 2 DD, RV function mildly reduced, RV size mildly enlarged, PASP 38.6 mmHg, LAE, RAE 1/30: Titrated off of Levophed drip,  work with PT, seen by palliative care. 1/31: Remains off Levophed drip.  Received HD. 2/1: Transferred to hospitalist service. 2/2: Remains confused, recurrent hypoglycemic episodes, poor intake 2/3: Recurrent hypoglycemia, IV fluids increased to 75 mL/h but given ESRD patient changed to D10 gtt at 4m/h; remains confused 2/4: Confusion improved, continues with mild delirium, mild hypoglycemic event overnight, D10 increased to 50 mL/h.  Remains with poor oral intake 2/5: Confusion much improved, continues with poor oral intake, no further hypoglycemic events, D10 decreased to 25 mL/h; HD today  Assessment & Plan:   Acute metabolic encephalopathy Patient presenting to ED with confusion.  Was notably hypoxic with SpO2 in the 80s on room air secondary to pneumonia.  Also history of EtOH abuse, ammonia level 34 on admission with EtOH level within normal limits.  Also hypotensive likely contributing factor. -- Continue treatment as below  Septic shock, POA Community-acquired pneumonia Patient presenting to ED with confusion, hypoxia with elevated WBC count of 13.5, procalcitonin 3.28.  Lactic acid up to 3.6.  Systolic blood pressure on admission in the 70s-80s and was hypothermic at 92.8 F.  COVID-19/RSV/influenza A/B PCR negative.  CT chest/abdomen/pelvis with mild dependent patchy opacities in the lungs bilaterally favoring mild aspiration.  Completed 6-day course of azithromycin and 5-day course of ceftriaxone.  Levophed was titrated off and supplemental oxygen also was titrated off. --Continue to monitor continuous SpO2  EtOH cirrhosis with ascites Patient underwent paracentesis by EDP on admission, SBP ruled out.  Ammonia level 34 on admission.  EtOH level within normal limits.  Last drink was reported 2 years ago. -- Continue volume management with hemodialysis. -- Folic acid, multivitamin, thiamine  Chronic combined systolic/diastolic congestive heart failure Previous LVEF 20%.  TTE on  1/29 with improved LVEF to 40%, LV with moderately decreased function, LV with global hypokinesis, grade 2 diastolic dysfunction, RVSP 38.6 mmHg, biatrial enlargement, trivial MR, moderate aortic valve stenosis, moderate/severe TR, IVC dilated.  GDMT limited due to patient's hypotension -- Midodrine 20 mg p.o. every 8 hours  ESRD on HD MWF Metabolic acidosis --Nephrology following, appreciate assistance -- Continue HD per nephrology  Hypomagnesemia Repleted.  Magnesium 1.9 today.  Elevated troponin Troponin elevated 62 on admission with BNP elevated greater than 4500.  Etiology likely secondary to type II demand ischemia in the setting of septic shock and volume overload on admission.  Hx DM2 Hypoglycemia Hemoglobin A1c 4.6 on 10/03/2022.  Diet controlled at baseline.  Patient with recurrent hypoglycemic events during hospitalization, likely secondary to poor oral intake.  Cortisol level slightly low at 6.3. -- D10 gtt at 24m/h -- Continue to encourage increased oral intake -- CBGs every 4 hours -- Calorie count ordered  Hallucinations: Resolved Patient with active visual/auditory hallucinations in the ICU, improved today; although continues with delirium.  Unclear etiology as his pneumonia has been treated, now oxygenating well on room air.  Continues to have mild hypoglycemic episodes.  Unclear etiology.  History of EtOH abuse.  Unlikely to tolerate MRI brain at this time.  Suspect related to hospital-acquired delirium/delirium of critical illness. -- Started Seroquel 25 mg p.o. nightly -- Treating hypoglycemia as above -- Supportive care  Weakness/debility/deconditioning/gait disturbance: -- PT/OT recommend SNF placement -- TOC for assistance  Suspected sleep apnea -- Will need outpatient sleep study  Goals of care: -- Palliative care following   DVT prophylaxis: heparin injection 5,000 Units Start: 10/03/22 2200    Code Status: Full Code Family Communication: Updated  family present at bedside yesterday afternoon, no family present this morning  Disposition Plan:  Level of care: Progressive Status is: Inpatient Remains inpatient appropriate because: Continues on IV dextrose drip, will need SNF placement    Consultants:  PCCM -signed off 2/1 Nephrology Palliative care  Procedures:  Paracentesis 1/28  Antimicrobials:  Vancomycin 1/28 - 1/28 Metronidazole 1/28 - 1/28 Ceftriaxone 1/28 - 2/1 Cefepime 1/28 - 1/28 Azithromycin 1/28 - 2/2   Subjective: Patient seen examined at bedside, lying in bed.  Confusion much improved, alert and oriented x 2 this morning, time just off as he stated was 2023.  Hypoglycemia resolved with glucose stable, will decrease D10 drip today.  Going to dialysis today.  No acute concerns overnight per nursing staff.  No family present at bedside this morning.  Objective: Vitals:   10/11/22 0414 10/11/22 0804 10/11/22 0814 10/11/22 0815  BP:  90/61 (!) 77/53 (!) 84/55  Pulse:  (!) 54 (!) 50 (!) 51  Resp:  '14 12 13  '$ Temp:  98.1 F (36.7 C)    TempSrc:      SpO2:  100%    Weight: 97.1 kg 95.2 kg    Height:        Intake/Output Summary (Last 24 hours) at 10/11/2022 0834 Last data filed at 10/10/2022 1828 Gross per 24 hour  Intake 660 ml  Output --  Net 660 ml   Filed Weights   10/10/22 0430 10/11/22 0414 10/11/22 0804  Weight: 93.5 kg 97.1 kg 95.2 kg    Examination:  Physical Exam: GEN: NAD, alert, oriented to place ([facility] Mussel  [place]), person Microbiologist Biden), but not time  (2023), chronically ill appearance, appears older than stated age 53: NCAT, PERRL, EOMI, sclera clear, MMM PULM: CTAB w/o wheezes/crackles, normal respiratory effort, on room air CV: RRR w/o M/G/R, left IJ central line noted, tunneled HD catheter noted right chest GI: abd soft, NTND, NABS, no R/G/M MSK: Trace bilateral lower extremity peripheral edema, moves all extremities independently NEURO: No focal deficits appreciated,  confused PSYCH: Intermittent confusion, no apparent hallucinations this morning Integumentary: Several areas of ecchymosis/skin tears noted to extremities in various stages of healing, no other concerning rashes/lesions/wounds noted on exposed skin surfaces    Data Reviewed: I have personally reviewed following labs and imaging studies  CBC: Recent Labs  Lab 10/05/22 0430 10/08/22 0417 10/08/22 1209 10/10/22 0446 10/11/22 0500  WBC 11.4* 6.4 6.4 5.9 4.9  HGB 10.4* 10.5* 10.9* 10.4* 10.5*  HCT 32.0* 31.6* 33.9* 32.3* 32.8*  MCV 91.2 89.8 92.4 91.0 93.2  PLT 82* 57* 63* 71* 75*   Basic Metabolic Panel: Recent Labs  Lab 10/05/22 0430 10/06/22 0231 10/08/22 0417 10/08/22 1209 10/10/22 0446 10/11/22 0500  NA 128* 127* 130* 127* 126* 123*  K 3.0* 3.5 3.0* 4.1 3.5 3.5  CL 93* 91* 102 92* 92* 89*  CO2 25 24 19* 21* 25 25  GLUCOSE 85 126* 61* 64* 128* 131*  BUN '10 16 12 16 13 16  '$ CREATININE 3.19* 3.86* 2.94* 3.99* 3.72* 4.13*  CALCIUM 7.9* 8.2* 6.7* 8.7* 8.0* 8.1*  MG 1.8  --   --   --  1.6* 1.9  PHOS  --   --   --  2.5  --  3.0   GFR: Estimated Creatinine Clearance: 21.3 mL/min (A) (by C-G formula based on SCr of 4.13 mg/dL (H)). Liver Function Tests: Recent Labs  Lab 10/08/22 1209 10/10/22 0446 10/11/22 0500  AST  --  29  --   ALT  --  13  --   ALKPHOS  --  78  --   BILITOT  --  1.0  --   PROT  --  5.7*  --   ALBUMIN 2.7* 2.3* 2.5*   No results for input(s): "LIPASE", "AMYLASE" in the last 168 hours.  No results for input(s): "AMMONIA" in the last 168 hours.  Coagulation Profile: No results for input(s): "INR", "PROTIME" in the last 168 hours.  Cardiac Enzymes: No results for input(s): "CKTOTAL", "CKMB", "CKMBINDEX", "TROPONINI" in the last 168 hours. BNP (last 3 results) No results for input(s): "PROBNP" in the last 8760 hours. HbA1C: No results for input(s): "HGBA1C" in the last 72 hours. CBG: Recent Labs  Lab 10/10/22 1556 10/10/22 2058  10/11/22 0015 10/11/22 0423 10/11/22 0746  GLUCAP 177* 160* 98 118* 135*   Lipid Profile: No results for input(s): "CHOL", "HDL", "LDLCALC", "TRIG", "CHOLHDL", "LDLDIRECT" in the last 72 hours. Thyroid Function Tests: No results for input(s): "TSH", "T4TOTAL", "FREET4", "T3FREE", "THYROIDAB" in the last 72 hours. Anemia Panel: Recent Labs    10/10/22 0446  VITAMINB12 1,245*   Sepsis Labs: Recent Labs  Lab 10/10/22 0446  PROCALCITON 0.85    Recent Results (from the past 240 hour(s))  Blood Culture (routine x 2)     Status: None   Collection Time: 10/03/22  3:08 PM   Specimen: BLOOD  Result Value Ref Range Status   Specimen Description BLOOD RIGHT ANTECUBITAL  Final   Special Requests   Final    BOTTLES DRAWN AEROBIC AND ANAEROBIC Blood Culture results may not  be optimal due to an inadequate volume of blood received in culture bottles   Culture   Final    NO GROWTH 5 DAYS Performed at [facility], [place] [address], [place], [place] [address]    Report Status 10/08/2022 FINAL  Final  Resp panel by RT-PCR (RSV, Flu A&B, Covid) Anterior Nasal Swab     Status: None   Collection Time: 10/03/22  3:13 PM   Specimen: Anterior Nasal Swab  Result Value Ref Range Status   SARS Coronavirus 2 by RT PCR NEGATIVE NEGATIVE Final    Comment: (NOTE) SARS-CoV-2 target nucleic acids are NOT DETECTED.  The SARS-CoV-2 RNA is generally detectable in upper respiratory specimens during the acute phase of infection. The lowest concentration of SARS-CoV-2 viral copies this assay can detect is 138 copies/mL. A negative result does not preclude SARS-Cov-2 infection and should not be used as the sole basis for treatment or other patient management decisions. A negative result may occur with  improper specimen collection/handling, submission of specimen other than nasopharyngeal swab, presence of viral mutation(s) within the areas targeted by this assay, and inadequate number of  viral copies(<138 copies/mL). A negative result must be combined with clinical observations, patient history, and epidemiological information. The expected result is Negative.  Fact Sheet for Patients:  EntrepreneurPulse.com.au  Fact Sheet for Healthcare Providers:  IncredibleEmployment.be  This test is no t yet approved or cleared by the [place] FDA and  has been authorized for detection and/or diagnosis of SARS-CoV-2 by FDA under an Emergency Use Authorization (EUA). This EUA will remain  in effect (meaning this test can be used) for the duration of the COVID-19 declaration under Section 564(b)(1) of the Act, 21 U.S.C.section 360bbb-3(b)(1), unless the authorization is terminated  or revoked sooner.       Influenza A by PCR NEGATIVE NEGATIVE Final   Influenza B by PCR NEGATIVE NEGATIVE Final    Comment: (NOTE) The Xpert Xpress SARS-CoV-2/FLU/RSV plus assay is intended as an aid in the diagnosis of influenza from Nasopharyngeal swab specimens and should not be used as a sole basis for treatment. Nasal washings and aspirates are unacceptable for Xpert Xpress SARS-CoV-2/FLU/RSV testing.  Fact Sheet for Patients: EntrepreneurPulse.com.au  Fact Sheet for Healthcare Providers: IncredibleEmployment.be  This test is not yet approved or cleared by the [place] FDA and has been authorized for detection and/or diagnosis of SARS-CoV-2 by FDA under an Emergency Use Authorization (EUA). This EUA will remain in effect (meaning this test can be used) for the duration of the COVID-19 declaration under Section 564(b)(1) of the Act, 21 U.S.C. section 360bbb-3(b)(1), unless the authorization is terminated or revoked.     Resp Syncytial Virus by PCR NEGATIVE NEGATIVE Final    Comment: (NOTE) Fact Sheet for Patients: EntrepreneurPulse.com.au  Fact Sheet for Healthcare  Providers: IncredibleEmployment.be  This test is not yet approved or cleared by the [place] FDA and has been authorized for detection and/or diagnosis of SARS-CoV-2 by FDA under an Emergency Use Authorization (EUA). This EUA will remain in effect (meaning this test can be used) for the duration of the COVID-19 declaration under Section 564(b)(1) of the Act, 21 U.S.C. section 360bbb-3(b)(1), unless the authorization is terminated or revoked.  Performed at [facility], [place] [address], [place], [place] [address]   Blood Culture (routine x 2)     Status: None   Collection Time: 10/03/22  3:13 PM   Specimen: BLOOD RIGHT FOREARM  Result Value Ref Range  Status   Specimen Description BLOOD RIGHT FOREARM  Final   Special Requests   Final    BOTTLES DRAWN AEROBIC AND ANAEROBIC Blood Culture results may not be optimal due to an inadequate volume of blood received in culture bottles   Culture   Final    NO GROWTH 5 DAYS Performed at [facility], [place] [address], [place], [place] [address]    Report Status 10/08/2022 FINAL  Final  Body fluid culture w Gram Stain     Status: None   Collection Time: 10/03/22  4:20 PM   Specimen: Peritoneal Cavity; Peritoneal Fluid  Result Value Ref Range Status   Specimen Description PERITONEAL CAVITY  Final   Special Requests NONE  Final   Gram Stain   Final    RARE WBC PRESENT,BOTH PMN AND MONONUCLEAR NO ORGANISMS SEEN    Culture   Final    NO GROWTH 3 DAYS Performed at [facility], [place] [address], [place], [place] [address]    Report Status 10/07/2022 FINAL  Final  MRSA Next Gen by PCR, Nasal     Status: Abnormal   Collection Time: 10/03/22 10:35 PM  Result Value Ref Range Status   MRSA by PCR Next Gen DETECTED (A) NOT DETECTED Final    Comment: RESULT CALLED TO, READ BACK BY AND VERIFIED WITH: LILLY RN 10/04/22 '@0001'$  BY AB (NOTE) The GeneXpert MRSA Assay (FDA approved for NASAL specimens only), is one  component of a comprehensive MRSA colonization surveillance program. It is not intended to diagnose MRSA infection nor to guide or monitor treatment for MRSA infections. Test performance is not FDA approved in patients less than 83 years old. Performed at [facility], [place] [address], [place], [place] [address]   Respiratory (~20 pathogens) panel by PCR     Status: None   Collection Time: 10/04/22 11:49 AM   Specimen: Nasopharyngeal Swab; Respiratory  Result Value Ref Range Status   Adenovirus NOT DETECTED NOT DETECTED Final   Coronavirus 229E NOT DETECTED NOT DETECTED Final    Comment: (NOTE) The Coronavirus on the Respiratory Panel, DOES NOT test for the novel  Coronavirus (2019 nCoV)    Coronavirus HKU1 NOT DETECTED NOT DETECTED Final   Coronavirus NL63 NOT DETECTED NOT DETECTED Final   Coronavirus OC43 NOT DETECTED NOT DETECTED Final   Metapneumovirus NOT DETECTED NOT DETECTED Final   Rhinovirus / Enterovirus NOT DETECTED NOT DETECTED Final   Influenza A NOT DETECTED NOT DETECTED Final   Influenza B NOT DETECTED NOT DETECTED Final   Parainfluenza Virus 1 NOT DETECTED NOT DETECTED Final   Parainfluenza Virus 2 NOT DETECTED NOT DETECTED Final   Parainfluenza Virus 3 NOT DETECTED NOT DETECTED Final   Parainfluenza Virus 4 NOT DETECTED NOT DETECTED Final   Respiratory Syncytial Virus NOT DETECTED NOT DETECTED Final   Bordetella pertussis NOT DETECTED NOT DETECTED Final   Bordetella Parapertussis NOT DETECTED NOT DETECTED Final   Chlamydophila pneumoniae NOT DETECTED NOT DETECTED Final   Mycoplasma pneumoniae NOT DETECTED NOT DETECTED Final    Comment: Performed at [facility], [place]. [address] [place],  [address]         Radiology Studies: No results found.      Scheduled Meds:  arformoterol  15 mcg Nebulization BID   budesonide (PULMICORT) nebulizer solution  0.5 mg Nebulization BID   Chlorhexidine Gluconate Cloth  6 each Topical H8299   folic  acid  1 mg Oral Daily  heparin  5,000 Units Subcutaneous Q8H   midodrine  20 mg Oral Q8H   multivitamin with minerals  1 tablet Oral Daily   mouth rinse  15 mL Mouth Rinse 4 times per day   QUEtiapine  25 mg Oral QHS   sodium chloride flush  10-40 mL Intracatheter Q12H   thiamine  100 mg Oral Daily   Continuous Infusions:  sodium chloride Stopped (10/08/22 2213)   albumin human     dextrose 25 mL/hr at 10/11/22 0715   magnesium sulfate bolus IVPB Stopped (10/10/22 0805)     LOS: 8 days    Time spent: 50 minutes spent on chart review, discussion with nursing staff, consultants, updating family and interview/physical exam; more than 50% of that time was spent in counseling and/or coordination of care.    Jaunice Mirza J [place] DO [facility] Available via Epic secure chat 7am-7pm After these hours, please refer to coverage provider listed on [email or web address] 10/11/2022, 8:34 AM

## 2022-10-12 DIAGNOSIS — E440 Moderate protein-calorie malnutrition: Secondary | ICD-10-CM

## 2022-10-12 LAB — RENAL FUNCTION PANEL
Albumin: 2.7 g/dL — ABNORMAL LOW (ref 3.5–5.0)
Anion gap: 10 (ref 5–15)
BUN: 9 mg/dL (ref 8–23)
CO2: 27 mmol/L (ref 22–32)
Calcium: 8.2 mg/dL — ABNORMAL LOW (ref 8.9–10.3)
Chloride: 91 mmol/L — ABNORMAL LOW (ref 98–111)
Creatinine, Ser: 3.03 mg/dL — ABNORMAL HIGH (ref 0.61–1.24)
GFR, Estimated: 22 mL/min — ABNORMAL LOW (ref >60)
Glucose, Bld: 107 mg/dL — ABNORMAL HIGH (ref 70–99)
Phosphorus: 2.8 mg/dL (ref 2.5–4.6)
Potassium: 3.8 mmol/L (ref 3.5–5.1)
Sodium: 128 mmol/L — ABNORMAL LOW (ref 135–145)

## 2022-10-12 LAB — GLUCOSE, CAPILLARY
Glucose-Capillary: 100 mg/dL — ABNORMAL HIGH (ref 70–99)
Glucose-Capillary: 101 mg/dL — ABNORMAL HIGH (ref 70–99)
Glucose-Capillary: 104 mg/dL — ABNORMAL HIGH (ref 70–99)
Glucose-Capillary: 114 mg/dL — ABNORMAL HIGH (ref 70–99)
Glucose-Capillary: 121 mg/dL — ABNORMAL HIGH (ref 70–99)
Glucose-Capillary: 126 mg/dL — ABNORMAL HIGH (ref 70–99)

## 2022-10-12 LAB — MAGNESIUM: Magnesium: 1.8 mg/dL (ref 1.7–2.4)

## 2022-10-12 MED ORDER — RENA-VITE PO TABS
1.0000 | ORAL_TABLET | Freq: Every day | ORAL | Status: DC
  Administered 2022-10-12: 1 via ORAL
  Filled 2022-10-12: qty 1

## 2022-10-12 MED ORDER — CHLORHEXIDINE GLUCONATE CLOTH 2 % EX PADS: 6.0000 | MEDICATED_PAD | Freq: Every day | CUTANEOUS | Status: DC

## 2022-10-12 NOTE — Progress Notes (Signed)
Daily Progress Note   Patient Name: Benjamin [name]       Date: 10/12/2022 DOB: 1956/06/22  Age: 67 y.o. MRN#: [id number] Attending Physician: [place] Benjamin Brem J, DO Primary Care Physician: Benjamin Flow, MD Admit Date: 10/03/2022 Length of Stay: 9 days  Reason for Consultation/Follow-up: Establishing goals of care  HPI/Patient Profile:  67 y.o. male  with past medical history of EtOH abuse, cirrhosis, ESRD (MWF-[facility]), advanced heart failure (EF 20%), and type 2 diabetes admitted on 10/03/2022 with AMS.   Patient is being treated for septic shock related to R ML space CAP (ceftriaxone and Zithromax), ARF, hypokalemia, and hypoglycemia.  Course of hospitalization has required use of Levophed drip, but it is currently remaining off with blood pressure soft.  He remains confused with recurrent hypoglycemic episodes.  Intake continues to remain remain poor.   PMT was consulted to discuss goals of care  Subjective:   Subjective: Chart Reviewed. Updates received. Patient Assessed. Created space and opportunity for patient  and family to explore thoughts and feelings regarding current medical situation.  Today's Discussion: Today I saw the patient at the bedside.  His wife was also present.  Also present was the IV nurse who is removing the left IJ catheter.  He again appears more awake today than yesterday.  His processing seems to be much improved.  We had a good conversation about goal to continue to try to get better.  It appears he has made some good progression.  The plan is to discharge to SNF and right now they are waiting for bed availability.  He denies any pain, nausea, vomiting.  At this point he expresses that there are no additional needs. I provided emotional and general support through therapeutic listening, empathy, sharing of stories, and other techniques. I answered all questions and addressed all concerns to the best of my ability.  Review of Systems  Constitutional:         Denies pain in general  Respiratory:  Negative for cough and shortness of breath.   Gastrointestinal:  Negative for abdominal pain, nausea and vomiting.    Objective:   Vital Signs:  BP (!) 82/56 (BP Location: Right Arm)   Pulse (!) 58   Temp 97.7 F (36.5 C)   Resp 18   Ht 6' (1.829 m)   Wt 93.5 kg   SpO2 93%   BMI 27.96 kg/m   Physical Exam: Physical Exam Vitals and nursing note reviewed.  HENT:     Head: Normocephalic and atraumatic.  Cardiovascular:     Rate and Rhythm: Normal rate.  Pulmonary:     Effort: Pulmonary effort is normal. No respiratory distress.  Abdominal:     General: Abdomen is protuberant. Bowel sounds are normal. There is no distension.     Palpations: Abdomen is soft.  Skin:    General: Skin is warm and dry.  Neurological:     General: No focal deficit present.     Mental Status: He is alert.  Psychiatric:        Mood and Affect: Mood normal.        Behavior: Behavior normal.     Palliative Assessment/Data: 50%    Existing Vynca/ACP Documentation: None  Assessment & Plan:   Impression: Present on Admission:  Septic shock ([place])  SUMMARY OF RECOMMENDATIONS   Full code Full scope of care Agreeable to SNF discharge for rehab, awaiting bed availability Again encouraged discussion of wishes with son/healthcare surrogate and family Goals are  clear PMT will back off at this point We remain available if significant decline or new palliative needs  Symptom Management:  Per primary team PMT is available to assist as needed  Code Status: Full code  Prognosis: Unable to determine  Discharge Planning: To Be Determined  Discussed with: Patient, family, medical team, nursing team  Thank you for allowing [place] to participate in the care of [name] PMT will continue to support holistically.  Time Total: 40 min  Visit consisted of counseling and education dealing with the complex and emotionally intense issues of symptom  management and palliative care in the setting of serious and potentially life-threatening illness. Greater than 50%  of this time was spent counseling and coordinating care related to the above assessment and plan.  Walden Field, NP Palliative Medicine Team  Team Phone # [phone] (Nights/Weekends)  05/05/2021, 8:17 AM

## 2022-10-12 NOTE — Progress Notes (Signed)
Physical Therapy Treatment Patient Details Name: Benjamin [name] MRN: [id number] DOB: 01-16-1956 Today's Date: 10/12/2022   History of Present Illness 67 yo male admitted 1/28 with AMS and hypoglycemia. Moderate ascites with diagnostic paracentesis in ED. 1/29 RUE infiltration. PMHx: T2DM, ESRD on HD MWF, HFrEF, cirrhosis    PT Comments    Pt with good progress towards acute goals this session and noted progress from OT session in AM. Pt A&Ox4 and pleasant throughout session, however pt continues to be limited by impaired balance/postural reactions, difficulty sequencing and problem solving, and general weakness. Pt able to come to stand with RW and mod a to power up and steady on rise initially down to min a at end of session. Pt demonstrating ambulation with RW for hallway distance with mod assist to steady and manage RW, pt needing increased for cues for RW proximity and safety awareness. Pt continues to remain at risk for falls and current plan remains appropriate to address deficits and maximize functional independence and decrease caregiver burden. Pt continues to benefit from skilled PT services to progress toward functional mobility goals.     Recommendations for follow up therapy are one component of a multi-disciplinary discharge planning process, led by the attending physician.  Recommendations may be updated based on patient status, additional functional criteria and insurance authorization.  Follow Up Recommendations  Skilled nursing-short term rehab (<3 hours/day) Can patient physically be transported by private vehicle: Yes   Assistance Recommended at Discharge Frequent or constant Supervision/Assistance  Patient can return home with the following Assistance with cooking/housework;Direct supervision/assist for medications management;Assist for transportation;A lot of help with walking and/or transfers;A lot of help with bathing/dressing/bathroom   Equipment Recommendations   Rolling walker (2 wheels)    Recommendations for Other Services       Precautions / Restrictions Precautions Precautions: Fall;Other (comment) Precaution Comments: watch BP; fragile skin Restrictions Weight Bearing Restrictions: No     Mobility  Bed Mobility Overal bed mobility: Needs Assistance Bed Mobility: Supine to Sit, Sit to Supine     Supine to sit: Min guard, HOB elevated Sit to supine: Min guard   General bed mobility comments: min guard for safety    Transfers Overall transfer level: Needs assistance Equipment used: Rolling walker (2 wheels) Transfers: Sit to/from Stand, Bed to chair/wheelchair/BSC Sit to Stand: Mod assist, Min assist           General transfer comment: mod a for initial stand down to min a at end of session    Ambulation/Gait Ambulation/Gait assistance: Mod assist Gait Distance (Feet): 54 Feet Assistive device: Rolling walker (2 wheels) Gait Pattern/deviations: Step-through pattern, Decreased stride length, Trunk flexed Gait velocity: decr     General Gait Details: mod assit to control RW intitally with pt able to progress to min assist to maintain balance, pt with c/o elbow fatigue with increased distance. no overt LOB, pt needing encouragment throughout   Stairs             Wheelchair Mobility    Modified Rankin (Stroke Patients Only)       Balance Overall balance assessment: Needs assistance Sitting-balance support: No upper extremity supported, Feet supported Sitting balance-Leahy Scale: Fair     Standing balance support: Bilateral upper extremity supported, Reliant on assistive device for balance Standing balance-Leahy Scale: Poor Standing balance comment: BUE reliance on RW/external support to maintain balance  Cognition Arousal/Alertness: Awake/alert Behavior During Therapy: Flat affect, WFL for tasks assessed/performed Overall Cognitive Status: Impaired/Different from  baseline Area of Impairment: Safety/judgement, Problem solving, Attention, Awareness, Memory, Following commands                   Current Attention Level: Selective Memory: Decreased short-term memory Following Commands: Follows one step commands consistently, Follows one step commands with increased time Safety/Judgement: Decreased awareness of safety, Decreased awareness of deficits Awareness: Emergent Problem Solving: Slow processing, Requires verbal cues, Requires tactile cues General Comments: A&Ox4, pleasant and follows directions with increasd time. some minor confusion noted w/ sequencing and problem solving        Exercises Other Exercises Other Exercises: warm up LE exercised, LAQ x10 ea side, seated marching x10 ea side    General Comments General comments (skin integrity, edema, etc.): BP soft during session      Pertinent Vitals/Pain Pain Assessment Pain Assessment: Faces Faces Pain Scale: Hurts little more Pain Location: bil elbows with gait Pain Descriptors / Indicators: Guarding, Grimacing Pain Intervention(s): Monitored during session, Limited activity within patient's tolerance    Home Living                          Prior Function            PT Goals (current goals can now be found in the care plan section) Acute Rehab PT Goals PT Goal Formulation: With patient Time For Goal Achievement: 10/19/22 Progress towards PT goals: Progressing toward goals    Frequency    Min 3X/week      PT Plan      Co-evaluation              AM-PAC PT "6 Clicks" Mobility   Outcome Measure  Help needed turning from your back to your side while in a flat bed without using bedrails?: A Little Help needed moving from lying on your back to sitting on the side of a flat bed without using bedrails?: A Little Help needed moving to and from a bed to a chair (including a wheelchair)?: A Lot Help needed standing up from a chair using your arms  (e.g., wheelchair or bedside chair)?: A Lot Help needed to walk in hospital room?: A Lot Help needed climbing 3-5 steps with a railing? : Total 6 Click Score: 13    End of Session Equipment Utilized During Treatment: Gait belt Activity Tolerance: Patient tolerated treatment well Patient left: in bed;with call bell/phone within reach;with bed alarm set Nurse Communication: Mobility status PT Visit Diagnosis: Other abnormalities of gait and mobility (R26.89);Muscle weakness (generalized) (M62.81)     Time: 4132-4401 PT Time Calculation (min) (ACUTE ONLY): 24 min  Charges:  $Gait Training: 8-22 mins $Therapeutic Activity: 8-22 mins                     Lannis Lichtenwalner R. PTA Acute Rehabilitation Services Office: [place] 10/12/2022, 11:06 AM

## 2022-10-12 NOTE — Progress Notes (Signed)
PROGRESS NOTE    Benjamin [name]  VPX:[id number] DOB: 27-Feb-1956 DOA: 10/03/2022 PCP: Mateo Flow, MD    Brief Narrative:   Benjamin [name] is a 67 y.o. male with past medical history significant for ESRD on HD MWF, chronic systolic congestive heart failure (LVEF 20%), type 2 diabetes mellitus, history of EtOH abuse with cirrhosis, who presented to [facility] ED on 1/28 via EMS from home for altered mental status, slurred speech.  Patient has been having increased productive cough with yellow sputum.  He last received HD on 1/26 but did not finish due to shortness of breath.  He subsequently became increasingly altered and presented to the ED for further evaluation.  Upon arrival to the ED, patient was noted to be altered/confused. WBC 13.5, Hgb 12.1, platelets 66, troponin 59 then 62, ammonia 34, ethanol WNL, NA 127, K3.4, CO2 20, LA 3.4 then 3.6 then 3.2, BNP greater than 4500, COVID/flu/RSV negative, VBG 7.24, 53, 40, 23.  CBG noted to be in the 40s and was given dextrose with some improvement in his mental status.  Patient was notably hypoxic in the 80s on room air and was placed on nasal cannula.  His SBP was low in the 70-80s and hypothermic at 92.8 F.  Sepsis protocol was initiated, cultures obtained and was started on broad-spectrum antibiotics.  Patient was given 1.5 L of IV fluids and albumin.  Patient remained hypotensive despite aggressive resuscitation and was initiated on vasopressors.  CT chest/abdomen/pelvis with mild patchy opacities right middle lobe, cirrhosis with moderate ascites.  Diagnostic paracentesis performed by ED physician.  PCCM consulted and was initially admitted to the intensive care unit.  Significant Hospital events: 1/28: Admitted to ICU for septic shock from CAP, on Levophed drip 1/29: Received hemodialysis, 1 L removed, TTE with LVEF 40%, grade 2 DD, RV function mildly reduced, RV size mildly enlarged, PASP 38.6 mmHg, LAE, RAE 1/30: Titrated off of Levophed drip,  work with PT, seen by palliative care. 1/31: Remains off Levophed drip.  Received HD. 2/1: Transferred to hospitalist service. 2/2: Remains confused, recurrent hypoglycemic episodes, poor intake 2/3: Recurrent hypoglycemia, IV fluids increased to 75 mL/h but given ESRD patient changed to D10 gtt at 80m/h; remains confused 2/4: Confusion improved, continues with mild delirium, mild hypoglycemic event overnight, D10 increased to 50 mL/h.  Remains with poor oral intake 2/5: Confusion much improved, continues with poor oral intake, no further hypoglycemic events, D10 decreased to 25 mL/h; HD today 2/6: Alert and oriented today, increased appetite.  Discontinue D10 drip.  Will also discontinue his left IJ central line as long as he has peripheral access.  Pending SNF placement.  Assessment & Plan:   Acute metabolic encephalopathy Patient presenting to ED with confusion.  Was notably hypoxic with SpO2 in the 80s on room air secondary to pneumonia.  Also history of EtOH abuse, ammonia level 34 on admission with EtOH level within normal limits.  Also hypotensive likely contributing factor. -- Continue treatment as below  Septic shock, POA Community-acquired pneumonia Patient presenting to ED with confusion, hypoxia with elevated WBC count of 13.5, procalcitonin 3.28.  Lactic acid up to 3.6.  Systolic blood pressure on admission in the 70s-80s and was hypothermic at 92.8 F.  COVID-19/RSV/influenza A/B PCR negative.  CT chest/abdomen/pelvis with mild dependent patchy opacities in the lungs bilaterally favoring mild aspiration.  Completed 6-day course of azithromycin and 5-day course of ceftriaxone.  Levophed was titrated off and supplemental oxygen also was titrated off. --Continue  to monitor continuous SpO2  EtOH cirrhosis with ascites Patient underwent paracentesis by EDP on admission, SBP ruled out.  Ammonia level 34 on admission.  EtOH level within normal limits.  Last drink was reported 2 years  ago. -- Continue volume management with hemodialysis. -- Folic acid, multivitamin, thiamine  Chronic combined systolic/diastolic congestive heart failure Previous LVEF 20%.  TTE on 1/29 with improved LVEF to 40%, LV with moderately decreased function, LV with global hypokinesis, grade 2 diastolic dysfunction, RVSP 38.6 mmHg, biatrial enlargement, trivial MR, moderate aortic valve stenosis, moderate/severe TR, IVC dilated.  GDMT limited due to patient's hypotension -- Midodrine 20 mg p.o. every 8 hours  ESRD on HD MWF Metabolic acidosis -- Nephrology following, appreciate assistance -- Continue HD per nephrology  Hypomagnesemia Repleted.  Magnesium 1.8 today.  Elevated troponin Troponin elevated 62 on admission with BNP elevated greater than 4500.  Etiology likely secondary to type II demand ischemia in the setting of septic shock and volume overload on admission.  Hx DM2 Hypoglycemia Hemoglobin A1c 4.6 on 10/03/2022.  Diet controlled at baseline.  Patient with recurrent hypoglycemic events during hospitalization, likely secondary to poor oral intake.  Cortisol level slightly low at 6.3. -- Discontinue D10 drip today -- Continue to encourage increased oral intake -- CBGs every 4 hours -- Calorie count ordered, seems appetite improving  Hallucinations: Resolved Patient with active visual/auditory hallucinations in the ICU, improved today; although continues with delirium.  Unclear etiology as his pneumonia has been treated, now oxygenating well on room air.  Continues to have mild hypoglycemic episodes.  Unclear etiology.  History of EtOH abuse.  Unlikely to tolerate MRI brain at this time.  Suspect related to hospital-acquired delirium/delirium of critical illness. -- Started Seroquel 25 mg p.o. nightly -- Treating hypoglycemia as above -- Supportive care  Weakness/debility/deconditioning/gait disturbance: -- PT/OT recommend SNF placement -- TOC for assistance  Suspected sleep  apnea -- Will need outpatient sleep study  Goals of care: -- Palliative care following   DVT prophylaxis: heparin injection 5,000 Units Start: 10/03/22 2200    Code Status: Full Code Family Communication: No family present at bedside this morning  Disposition Plan:  Level of care: Med-Surg Status is: Inpatient Remains inpatient appropriate because: Pending SNF placement    Consultants:  PCCM -signed off 2/1 Nephrology Palliative care  Procedures:  Paracentesis 1/28  Antimicrobials:  Vancomycin 1/28 - 1/28 Metronidazole 1/28 - 1/28 Ceftriaxone 1/28 - 2/1 Cefepime 1/28 - 1/28 Azithromycin 1/28 - 2/2   Subjective: Patient seen examined at bedside, lying in bed.  Alert and oriented.  OT present.  Glucose has been stable, will discontinue D10 drip today.  Also discussed will try to remove his left IJ central line as long as he has peripheral access.  Tolerated dialysis yesterday.  No acute concerns overnight per nursing staff.  No family present at bedside this morning.  Pending SNF placement.  Objective: Vitals:   10/11/22 2003 10/12/22 0030 10/12/22 0354 10/12/22 0708  BP: (!) 89/55 (!) 113/98 (!) 83/55 93/63  Pulse:  60  63  Resp: '15 15 15 17  '$ Temp: 97.8 F (36.6 C) 97.8 F (36.6 C) 97.8 F (36.6 C)   TempSrc: Oral Oral Oral   SpO2:    100%  Weight:   93.5 kg   Height:        Intake/Output Summary (Last 24 hours) at 10/12/2022 0953 Last data filed at 10/11/2022 1700 Gross per 24 hour  Intake 1664.56 ml  Output 2000 ml  Net -335.44 ml   Filed Weights   10/11/22 0804 10/11/22 1230 10/12/22 0354  Weight: 95.2 kg 93.2 kg 93.5 kg    Examination:  Physical Exam: GEN: NAD, alert, oriented to place [facility] hospital), person ([employer] [place]), time (2024), chronically ill appearance, appears older than stated age 61: NCAT, PERRL, EOMI, sclera clear, MMM PULM: CTAB w/o wheezes/crackles, normal respiratory effort, on room air CV: RRR w/o M/G/R, left IJ  central line noted, tunneled HD catheter noted right chest GI: abd soft, NTND, NABS, no R/G/M MSK: Trace bilateral lower extremity peripheral edema, moves all extremities independently NEURO: No focal deficits appreciated PSYCH: Depressed mood, flat affect Integumentary: Several areas of ecchymosis/skin tears noted to extremities in various stages of healing, no other concerning rashes/lesions/wounds noted on exposed skin surfaces    Data Reviewed: I have personally reviewed following labs and imaging studies  CBC: Recent Labs  Lab 10/08/22 0417 10/08/22 1209 10/10/22 0446 10/11/22 0500  WBC 6.4 6.4 5.9 4.9  HGB 10.5* 10.9* 10.4* 10.5*  HCT 31.6* 33.9* 32.3* 32.8*  MCV 89.8 92.4 91.0 93.2  PLT 57* 63* 71* 75*   Basic Metabolic Panel: Recent Labs  Lab 10/08/22 0417 10/08/22 1209 10/10/22 0446 10/11/22 0500 10/12/22 0550  NA 130* 127* 126* 123* 128*  K 3.0* 4.1 3.5 3.5 3.8  CL 102 92* 92* 89* 91*  CO2 19* 21* '25 25 27  '$ GLUCOSE 61* 64* 128* 131* 107*  BUN '12 16 13 16 9  '$ CREATININE 2.94* 3.99* 3.72* 4.13* 3.03*  CALCIUM 6.7* 8.7* 8.0* 8.1* 8.2*  MG  --   --  1.6* 1.9 1.8  PHOS  --  2.5  --  3.0 2.8   GFR: Estimated Creatinine Clearance: 28.9 mL/min (A) (by C-G formula based on SCr of 3.03 mg/dL (H)). Liver Function Tests: Recent Labs  Lab 10/08/22 1209 10/10/22 0446 10/11/22 0500 10/12/22 0550  AST  --  29  --   --   ALT  --  13  --   --   ALKPHOS  --  78  --   --   BILITOT  --  1.0  --   --   PROT  --  5.7*  --   --   ALBUMIN 2.7* 2.3* 2.5* 2.7*   No results for input(s): "LIPASE", "AMYLASE" in the last 168 hours.  No results for input(s): "AMMONIA" in the last 168 hours.  Coagulation Profile: No results for input(s): "INR", "PROTIME" in the last 168 hours.  Cardiac Enzymes: No results for input(s): "CKTOTAL", "CKMB", "CKMBINDEX", "TROPONINI" in the last 168 hours. BNP (last 3 results) No results for input(s): "PROBNP" in the last 8760  hours. HbA1C: No results for input(s): "HGBA1C" in the last 72 hours. CBG: Recent Labs  Lab 10/11/22 1613 10/11/22 2027 10/12/22 0001 10/12/22 0353 10/12/22 0747  GLUCAP 122* 140* 114* 101* 104*   Lipid Profile: No results for input(s): "CHOL", "HDL", "LDLCALC", "TRIG", "CHOLHDL", "LDLDIRECT" in the last 72 hours. Thyroid Function Tests: No results for input(s): "TSH", "T4TOTAL", "FREET4", "T3FREE", "THYROIDAB" in the last 72 hours. Anemia Panel: Recent Labs    10/10/22 0446  VITAMINB12 1,245*   Sepsis Labs: Recent Labs  Lab 10/10/22 0446  PROCALCITON 0.85    Recent Results (from the past 240 hour(s))  Blood Culture (routine x 2)     Status: None   Collection Time: 10/03/22  3:08 PM   Specimen: BLOOD  Result Value Ref Range Status   Specimen Description BLOOD RIGHT  ANTECUBITAL  Final   Special Requests   Final    BOTTLES DRAWN AEROBIC AND ANAEROBIC Blood Culture results may not be optimal due to an inadequate volume of blood received in culture bottles   Culture   Final    NO GROWTH 5 DAYS Performed at [facility], [place] [address] [place], [place] [address]    Report Status 10/08/2022 FINAL  Final  Resp panel by RT-PCR (RSV, Flu A&B, Covid) Anterior Nasal Swab     Status: None   Collection Time: 10/03/22  3:13 PM   Specimen: Anterior Nasal Swab  Result Value Ref Range Status   SARS Coronavirus 2 by RT PCR NEGATIVE NEGATIVE Final    Comment: (NOTE) SARS-CoV-2 target nucleic acids are NOT DETECTED.  The SARS-CoV-2 RNA is generally detectable in upper respiratory specimens during the acute phase of infection. The lowest concentration of SARS-CoV-2 viral copies this assay can detect is 138 copies/mL. A negative result does not preclude SARS-Cov-2 infection and should not be used as the sole basis for treatment or other patient management decisions. A negative result may occur with  improper specimen collection/handling, submission of specimen other than  nasopharyngeal swab, presence of viral mutation(s) within the areas targeted by this assay, and inadequate number of viral copies(<138 copies/mL). A negative result must be combined with clinical observations, patient history, and epidemiological information. The expected result is Negative.  Fact Sheet for Patients:  EntrepreneurPulse.com.au  Fact Sheet for Healthcare Providers:  IncredibleEmployment.be  This test is no t yet approved or cleared by the [place] FDA and  has been authorized for detection and/or diagnosis of SARS-CoV-2 by FDA under an Emergency Use Authorization (EUA). This EUA will remain  in effect (meaning this test can be used) for the duration of the COVID-19 declaration under Section 564(b)(1) of the Act, 21 U.S.C.section 360bbb-3(b)(1), unless the authorization is terminated  or revoked sooner.       Influenza A by PCR NEGATIVE NEGATIVE Final   Influenza B by PCR NEGATIVE NEGATIVE Final    Comment: (NOTE) The Xpert Xpress SARS-CoV-2/FLU/RSV plus assay is intended as an aid in the diagnosis of influenza from Nasopharyngeal swab specimens and should not be used as a sole basis for treatment. Nasal washings and aspirates are unacceptable for Xpert Xpress SARS-CoV-2/FLU/RSV testing.  Fact Sheet for Patients: EntrepreneurPulse.com.au  Fact Sheet for Healthcare Providers: IncredibleEmployment.be  This test is not yet approved or cleared by the [place] FDA and has been authorized for detection and/or diagnosis of SARS-CoV-2 by FDA under an Emergency Use Authorization (EUA). This EUA will remain in effect (meaning this test can be used) for the duration of the COVID-19 declaration under Section 564(b)(1) of the Act, 21 U.S.C. section 360bbb-3(b)(1), unless the authorization is terminated or revoked.     Resp Syncytial Virus by PCR NEGATIVE NEGATIVE Final    Comment:  (NOTE) Fact Sheet for Patients: EntrepreneurPulse.com.au  Fact Sheet for Healthcare Providers: IncredibleEmployment.be  This test is not yet approved or cleared by the [place] FDA and has been authorized for detection and/or diagnosis of SARS-CoV-2 by FDA under an Emergency Use Authorization (EUA). This EUA will remain in effect (meaning this test can be used) for the duration of the COVID-19 declaration under Section 564(b)(1) of the Act, 21 U.S.C. section 360bbb-3(b)(1), unless the authorization is terminated or revoked.  Performed at [facility], [place] [address] [place], [place] [address]   Blood Culture (routine x 2)  Status: None   Collection Time: 10/03/22  3:13 PM   Specimen: BLOOD RIGHT FOREARM  Result Value Ref Range Status   Specimen Description BLOOD RIGHT FOREARM  Final   Special Requests   Final    BOTTLES DRAWN AEROBIC AND ANAEROBIC Blood Culture results may not be optimal due to an inadequate volume of blood received in culture bottles   Culture   Final    NO GROWTH 5 DAYS Performed at [facility], [place] [address] [place], [place] [address]    Report Status 10/08/2022 FINAL  Final  Body fluid culture w Gram Stain     Status: None   Collection Time: 10/03/22  4:20 PM   Specimen: Peritoneal Cavity; Peritoneal Fluid  Result Value Ref Range Status   Specimen Description PERITONEAL CAVITY  Final   Special Requests NONE  Final   Gram Stain   Final    RARE WBC PRESENT,BOTH PMN AND MONONUCLEAR NO ORGANISMS SEEN    Culture   Final    NO GROWTH 3 DAYS Performed at [facility], [place] [address] [place], [place] [address]    Report Status 10/07/2022 FINAL  Final  MRSA Next Gen by PCR, Nasal     Status: Abnormal   Collection Time: 10/03/22 10:35 PM  Result Value Ref Range Status   MRSA by PCR Next Gen DETECTED (A) NOT DETECTED Final    Comment: RESULT CALLED TO, READ BACK BY AND VERIFIED WITH: LILLY RN  10/04/22 '@0001'$  BY AB (NOTE) The GeneXpert MRSA Assay (FDA approved for NASAL specimens only), is one component of a comprehensive MRSA colonization surveillance program. It is not intended to diagnose MRSA infection nor to guide or monitor treatment for MRSA infections. Test performance is not FDA approved in patients less than 2 years old. Performed at [facility], [place] [address] [place], [place] [address]   Respiratory (~20 pathogens) panel by PCR     Status: None   Collection Time: 10/04/22 11:49 AM   Specimen: Nasopharyngeal Swab; Respiratory  Result Value Ref Range Status   Adenovirus NOT DETECTED NOT DETECTED Final   Coronavirus 229E NOT DETECTED NOT DETECTED Final    Comment: (NOTE) The Coronavirus on the Respiratory Panel, DOES NOT test for the novel  Coronavirus (2019 nCoV)    Coronavirus HKU1 NOT DETECTED NOT DETECTED Final   Coronavirus NL63 NOT DETECTED NOT DETECTED Final   Coronavirus OC43 NOT DETECTED NOT DETECTED Final   Metapneumovirus NOT DETECTED NOT DETECTED Final   Rhinovirus / Enterovirus NOT DETECTED NOT DETECTED Final   Influenza A NOT DETECTED NOT DETECTED Final   Influenza B NOT DETECTED NOT DETECTED Final   Parainfluenza Virus 1 NOT DETECTED NOT DETECTED Final   Parainfluenza Virus 2 NOT DETECTED NOT DETECTED Final   Parainfluenza Virus 3 NOT DETECTED NOT DETECTED Final   Parainfluenza Virus 4 NOT DETECTED NOT DETECTED Final   Respiratory Syncytial Virus NOT DETECTED NOT DETECTED Final   Bordetella pertussis NOT DETECTED NOT DETECTED Final   Bordetella Parapertussis NOT DETECTED NOT DETECTED Final   Chlamydophila pneumoniae NOT DETECTED NOT DETECTED Final   Mycoplasma pneumoniae NOT DETECTED NOT DETECTED Final    Comment: Performed at [facility], [place]. [address], [place], [place] [address]         Radiology Studies: No results found.      Scheduled Meds:  arformoterol  15 mcg Nebulization BID   budesonide (PULMICORT)  nebulizer solution  0.5 mg  Nebulization BID   Chlorhexidine Gluconate Cloth  6 each Topical Q0600   feeding supplement  237 mL Oral BID BM   folic acid  1 mg Oral Daily   heparin  5,000 Units Subcutaneous Q8H   midodrine  20 mg Oral Q8H   multivitamin with minerals  1 tablet Oral Daily   mouth rinse  15 mL Mouth Rinse 4 times per day   QUEtiapine  25 mg Oral QHS   sodium chloride flush  10-40 mL Intracatheter Q12H   thiamine  100 mg Oral Daily   Continuous Infusions:  sodium chloride Stopped (10/08/22 2213)   magnesium sulfate bolus IVPB Stopped (10/10/22 0805)     LOS: 9 days    Time spent: 50 minutes spent on chart review, discussion with nursing staff, consultants, updating family and interview/physical exam; more than 50% of that time was spent in counseling and/or coordination of care.    Ellanor Feuerstein J [place] DO [facility] Available via Epic secure chat 7am-7pm After these hours, please refer to coverage provider listed on [email or web address] 10/12/2022, 9:53 AM

## 2022-10-12 NOTE — Progress Notes (Signed)
Initial Nutrition Assessment  DOCUMENTATION CODES:   Non-severe (moderate) malnutrition in context of chronic illness  INTERVENTION:  Discontinue calorie count Liberalize diet from a heart healthy to a 2 gram sodium diet to provide wider variety of menu options to enhance nutritional adequacy Assistance with meal set up  Ensure Enlive po BID, each supplement provides 350 kcal and 20 grams of protein. Renal MVI with minerals daily  NUTRITION DIAGNOSIS:   Moderate Malnutrition related to chronic illness (ESRD, cirrhosis) as evidenced by mild fat depletion, moderate muscle depletion.  GOAL:   Patient will meet greater than or equal to 90% of their needs  MONITOR:   PO intake, Supplement acceptance, Labs, I & O's, Weight trends  REASON FOR ASSESSMENT:   Consult Calorie Count  ASSESSMENT:   Pt admitted with AMS r/t septic shock. PMH significant for ESRD on HD, HFrEF (LVEF 20%), T2DM, prior EtOH use with cirrhosis  Alert and oriented x2 today. Appetite and PO intake improving. Discontinued D10 drip today as pt was having hypoglycemia.   PMT following for ongoing [place] discussions. Continue with full scope of care. Now pending SNF placement.   Spoke with patient at bedside. No family present at time of visit. Pt unable to provide detailed nutrition related history as he states that he has poor memory/recall of what has been happening. He states that his appetite is not great but his intake is improving. He prefers to sit up in chair during mealtimes and needs assistance with tray set up, made RN aware. Pt had Ensure on table which he enjoys and agrees to continue.   Meal completions: 02/01: 100% breakfast 02/02: 25% breakfast, 0% lunch 02/04: 0% breakfast, 80% lunch, 5% dinner  EDW 86.9 kg Post HD weight: 93.5 kg  Medications: folvite, rena-vit, thiamine  Labs: sodium 128, Cr 3.03, GFR 22, CBG's 101-140 x24 hours  Post HD net UF (02/05): 2L I/O's: +6221m since admit    NUTRITION - FOCUSED PHYSICAL EXAM: Flowsheet Row Most Recent Value  Orbital Region Mild depletion  Upper Arm Region Severe depletion  Thoracic and Lumbar Region No depletion  Buccal Region Moderate depletion  Temple Region No depletion  Clavicle Bone Region Mild depletion  Clavicle and Acromion Bone Region No depletion  Scapular Bone Region No depletion  Dorsal Hand Moderate depletion  Patellar Region Moderate depletion  Anterior Thigh Region Moderate depletion  Posterior Calf Region No depletion  Edema (RD Assessment) Mild  [BLE]  Hair Reviewed  Eyes Reviewed  Mouth Reviewed  Skin Reviewed  Nails Reviewed      Diet Order:   Diet Order             Diet Heart Room service appropriate? Yes; Fluid consistency: Thin; Fluid restriction: 1200 mL Fluid  Diet effective now                   EDUCATION NEEDS:   Not appropriate for education at this time  Skin:  Skin Assessment: Reviewed RN Assessment  Last BM:  2/5  Height:   Ht Readings from Last 1 Encounters:  10/03/22 6' (1.829 m)    Weight:   Wt Readings from Last 1 Encounters:  10/12/22 93.5 kg   BMI:  Body mass index is 27.96 kg/m.  Estimated Nutritional Needs:   Kcal:  2200-2400  Protein:  115-130g  Fluid:  1L + UOP  AClayborne Dana RDN, LDN Clinical Nutrition

## 2022-10-12 NOTE — Progress Notes (Signed)
Advised by RN CM that pt may d/c to snf tomorrow if stable. Contacted inpt HD unit to request pt receive HD 1st shift if possible so pt will not arrive late to snf if pt stable for d/c tomorrow. Will contact [facility] with update.   Melven Sartorius Renal Navigator [phone]

## 2022-10-12 NOTE — Progress Notes (Signed)
Calorie Count Note- day 2  48 hour calorie count ordered.  Diet: Heart Healthy, 1259m fluid restriction Supplements: Ensure Enlive po BID, each supplement provides 350 kcal and 20 grams of protein.  Lunch: 0% missed d/t HD Dinner: 285 kcal, 16g protein Breakfast: 528 kcal, 21g protein Supplements: 525 kcal, 10g protein   Total intake: 1338 kcal (61% of minimum estimated needs)  47 protein (41% of minimum estimated needs)  Nutrition Dx: Moderate Malnutrition related to chronic illness (ESRD, cirrhosis) as evidenced by mild fat depletion, moderate muscle depletion.   Goal:  Patient will meet greater than or equal to 90% of their needs   Intervention:  Discontinue calorie count Liberalize diet from a heart healthy to a 2 gram sodium diet to provide wider variety of menu options to enhance nutritional adequacy Assistance with meal set up  Ensure Enlive po BID, each supplement provides 350 kcal and 20 grams of protein. Renal MVI with minerals daily  AClayborne Dana RDN, LDN Clinical Nutrition

## 2022-10-12 NOTE — TOC Progression Note (Addendum)
Transition of Care [facility]) - Progression Note    Patient Details  Name: ASA BAUDOIN MRN: [id number] Date of Birth: Sep 04, 1956  Transition of Care [facility]) CM/SW Contact  Curlene Labrum, RN Phone Number: 10/12/2022, 12:43 PM  Clinical Narrative:    CM called and spoke with the patient's son while I was at the bedside to discuss SNF options and the son would like to speak to his mother to discuss options for placement.  I will follow up this afternoon.  I called and left a message with Cyndia Skeeters, CM at [facility] at the request of the family.  10/12/2022 1348 - CM called and spoke with Cyndia Skeeters, MSW at [facility] and the facility is unable to offer bed availability.  The patient's son is aware.  [facility] agreed to bed offer at the facility and they are able to provide transport to HD center MWF.  CM will continue to follow for discharge to the facility - once patient is medically stable.   Expected Discharge Plan: [place] Barriers to Discharge: Continued Medical Work up  Expected Discharge Plan and Services   Discharge Planning Services: CM Consult Post Acute Care Choice: [place] arrangements for the past 2 months: Single Family Home                           HH Arranged: PT, OT, Nurse's Aide [place] Agency: [place]         Social Determinants of Health (SDOH) Interventions SDOH Screenings   Tobacco Use: Medium Risk (10/03/2022)    Readmission Risk Interventions     No data to display

## 2022-10-12 NOTE — Progress Notes (Signed)
Pt arrived to 2W via wheelchair. Pt sitting in recliner, no complaints of pain, SOB, or chest pain. All needs met at this time, call light within reach.

## 2022-10-12 NOTE — Progress Notes (Signed)
Occupational Therapy Treatment Patient Details Name: Benjamin [name] MRN: [id number] DOB: 01/09/1956 Today's Date: 10/12/2022   History of present illness 67 yo male admitted 1/28 with AMS and hypoglycemia. Moderate ascites with diagnostic paracentesis in ED. 1/29 RUE infiltration. PMHx: T2DM, ESRD on HD MWF, HFrEF, cirrhosis   OT comments  Pt with noted functional decline since prior OT session with increased assist needed to stand and noted unsteadiness. Pt requires overall Mod A x 2 to stand progressing to Min A x 2 for pivot to chair with RW. Pt able to assist with seated LB ADLs at [place] A and setup for breakfast in chair prior to OT exit. Based on slow progress and increased physical assist needed, updating DC recs to SNF rehab at DC w/ pt agreeable.   Recommendations for follow up therapy are one component of a multi-disciplinary discharge planning process, led by the attending physician.  Recommendations may be updated based on patient status, additional functional criteria and insurance authorization.    Follow Up Recommendations  Skilled nursing-short term rehab (<3 hours/day)     Assistance Recommended at Discharge Intermittent Supervision/Assistance  Patient can return home with the following  A little help with walking and/or transfers;A little help with bathing/dressing/bathroom;Assistance with cooking/housework   Equipment Recommendations  None recommended by OT    Recommendations for Other Services      Precautions / Restrictions Precautions Precautions: Fall;Other (comment) Precaution Comments: watch BP; fragile skin Restrictions Weight Bearing Restrictions: No       Mobility Bed Mobility Overal bed mobility: Needs Assistance Bed Mobility: Supine to Sit     Supine to sit: Min assist, HOB elevated     General bed mobility comments: assist to lift trunk w/ good effort on pt part    Transfers Overall transfer level: Needs assistance Equipment used: Rolling  walker (2 wheels) Transfers: Sit to/from Stand, Bed to chair/wheelchair/BSC Sit to Stand: Mod assist, +2 physical assistance     Step pivot transfers: Min assist, +2 safety/equipment     General transfer comment: Pt unsuccessful with initial standing attempts from bedside with RW. NT entering and able to assist with overall Mod A x 2 needed to stand with RW, cues for posture and assist to pivot to chair with Min A X 2 for safety (light assist to [facility] DME)     Balance Overall balance assessment: Needs assistance Sitting-balance support: No upper extremity supported, Feet supported Sitting balance-Leahy Scale: Fair     Standing balance support: Bilateral upper extremity supported, Reliant on assistive device for balance Standing balance-Leahy Scale: Poor                             ADL either performed or assessed with clinical judgement   ADL Overall ADL's : Needs assistance/impaired Eating/Feeding: Set up;Sitting Eating/Feeding Details (indicate cue type and reason): assist to open packages and cut up pancake Grooming: Set up;Sitting;Wash/dry face               Lower Body Dressing: Minimal assistance Lower Body Dressing Details (indicate cue type and reason): assist to don socks over toes w/ pt in figure four position - able to complete rest of task                    Extremity/Trunk Assessment Upper Extremity Assessment Upper Extremity Assessment: Generalized weakness RUE Deficits / Details: very fragile skin on B UE.   Lower Extremity Assessment Lower  Extremity Assessment: Defer to PT evaluation        Vision   Vision Assessment?: No apparent visual deficits   Perception     Praxis      Cognition Arousal/Alertness: Awake/alert Behavior During Therapy: Flat affect, WFL for tasks assessed/performed Overall Cognitive Status: Impaired/Different from baseline Area of Impairment: Safety/judgement, Problem solving, Attention, Awareness,  Memory, Following commands                   Current Attention Level: Selective Memory: Decreased short-term memory Following Commands: Follows one step commands consistently, Follows one step commands with increased time Safety/Judgement: Decreased awareness of safety, Decreased awareness of deficits Awareness: Emergent Problem Solving: Slow processing, Requires verbal cues, Requires tactile cues General Comments: A&Ox4, pleasant and follows directions with increasd time. some minor confusion noted w/ sequencing and problem solving        Exercises      Shoulder Instructions       General Comments BP soft during session    Pertinent Vitals/ Pain       Pain Assessment Pain Assessment: Faces Faces Pain Scale: Hurts a little bit Pain Location: back Pain Descriptors / Indicators: Guarding, Grimacing Pain Intervention(s): Monitored during session  Home Living                                          Prior Functioning/Environment              Frequency  Min 2X/week        Progress Toward Goals  OT Goals(current goals can now be found in the care plan section)  Progress towards OT goals: Progressing toward goals  Acute Rehab OT Goals Patient Stated Goal: eat some breakfast, get better soon OT Goal Formulation: With patient Time For Goal Achievement: 10/19/22 Potential to Achieve Goals: Good ADL Goals Pt Will Perform Lower Body Bathing: with modified independence;sit to/from stand Pt Will Perform Lower Body Dressing: with modified independence;sit to/from stand Pt Will Transfer to Toilet: with modified independence;ambulating Additional ADL Goal #1: Pt to demo ability to gather ADL/IADL items MOD I without LOB  Plan Discharge plan needs to be updated    Co-evaluation                 AM-PAC OT "6 Clicks" Daily Activity     Outcome Measure   Help from another person eating meals?: A Little Help from another person taking  care of personal grooming?: A Little Help from another person toileting, which includes using toliet, bedpan, or urinal?: A Little Help from another person bathing (including washing, rinsing, drying)?: A Little Help from another person to put on and taking off regular upper body clothing?: A Little Help from another person to put on and taking off regular lower body clothing?: A Little 6 Click Score: 18    End of Session Equipment Utilized During Treatment: Gait belt;Rolling walker (2 wheels)  OT Visit Diagnosis: Unsteadiness on feet (R26.81);Other abnormalities of gait and mobility (R26.89)   Activity Tolerance Patient tolerated treatment well   Patient Left in chair;with call bell/phone within reach;with chair alarm set;Other (comment) (no green chair alarm box in room - collab with nursing secretary to locate and attach to chair alarm pad. no nurse call cord able to be located - RN/NT aware and NT reports this is a recurrent issue at times on the unit)  Nurse Communication Mobility status        Time: 0727-0802 OT Time Calculation (min): 35 min  Charges: OT General Charges $OT Visit: 1 Visit OT Treatments $Self Care/Home Management : 8-22 mins $Therapeutic Activity: 8-22 mins  Malachy Chamber, OTR/L Acute Rehab Services Office: [phone]   Layla Maw 10/12/2022, 8:12 AM

## 2022-10-12 NOTE — Progress Notes (Signed)
[facility] NEPHROLOGY PROGRESS NOTE  Subjective: Seen in room, is Ox 3 today. Still sluggish speech.   Objective Vitals:   10/11/22 1200 10/11/22 1221 10/11/22 1230 10/11/22 1235  BP: (!) '82/62 99/62 95/61 '$ (!) 87/57  Pulse: (!) 58 (!) 58 (!) 58 (!) 59  Resp: '14 11 11 12  '$ Temp:   98.7 F (37.1 C)   TempSrc:      SpO2:   96% 97%  Weight:   93.2 kg   Height:       Physical Exam: General: Alert awake, lying in bed comfortable. Heart:RRR, s1s2 nl Lungs: Clear bilateral.  No wheeze. Abdomen:soft, Non-tender, non-distended, diffuse 2+ ascites Extremities: No edema. Dialysis Access: RIJ TDC in place.   OP HD: [facility] MWF 4h  400/600  86.9kg  3K/2.5Ca bath  TDC  RIJ + LUA AVF  Heparin none - last HD 1/26, post wt 88.3kg   - soft BP's at HD in 90s for last 2 wks at minimum - venofer '50mg'$  weekly - mircera 30 mcg q4 wks, last on 1/15, due 2/12    Assessment/ Plan: # Septic shock w/ acute pneumonia - completed ceftriaxone and azithromycin.  Off of pressors and out of ICU. Currently on midodrine 20 tid w/ marginal BP's. Will lower midodrine 15 tid, not sure it's working.   # Volume - remains 6 kg up, getting 2 L max UF due to low BP's  # ESRD - on HD MWF. HD tomorrow.    # Acute hypoxic respiratory failure due to pneumonia: Improved and now on room air.     # Anemia of CKD: Hemoglobin at goal.  [facility] as outpatient.  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    # Hyponatremia - 123 up to 128 today. Better.   # Acute metabolic encephalopathy - Ox 3 today, still sluggish w/ responses  # Prognosis - seems guarded, will d/w pmd    Kelly Splinter, MD 10/12/2022, 3:05 PM  Recent Labs  Lab 10/10/22 0446 10/11/22 0500 10/12/22 0550  HGB 10.4* 10.5*  --   ALBUMIN 2.3* 2.5* 2.7*  CALCIUM 8.0* 8.1* 8.2*  PHOS  --  3.0 2.8  CREATININE 3.72* 4.13* 3.03*  K 3.5 3.5 3.8     Inpatient medications:  arformoterol  15 mcg Nebulization BID    budesonide (PULMICORT) nebulizer solution  0.5 mg Nebulization BID   Chlorhexidine Gluconate Cloth  6 each Topical Q0600   feeding supplement  237 mL Oral BID BM   folic acid  1 mg Oral Daily   heparin  5,000 Units Subcutaneous Q8H   midodrine  20 mg Oral Q8H   multivitamin  1 tablet Oral QHS   mouth rinse  15 mL Mouth Rinse 4 times per day   QUEtiapine  25 mg Oral QHS   sodium chloride flush  10-40 mL Intracatheter Q12H   thiamine  100 mg Oral Daily    sodium chloride Stopped (10/08/22 2213)   acetaminophen, docusate sodium, haloperidol lactate, hydrOXYzine, ipratropium-albuterol, polyethylene glycol

## 2022-10-13 LAB — CBC
HCT: 32.7 % — ABNORMAL LOW (ref 39.0–52.0)
Hemoglobin: 10.2 g/dL — ABNORMAL LOW (ref 13.0–17.0)
MCH: 29.3 pg (ref 26.0–34.0)
MCHC: 31.2 g/dL (ref 30.0–36.0)
MCV: 94 fL (ref 80.0–100.0)
Platelets: 62 10*3/uL — ABNORMAL LOW (ref 150–400)
RBC: 3.48 MIL/uL — ABNORMAL LOW (ref 4.22–5.81)
RDW: 18.2 % — ABNORMAL HIGH (ref 11.5–15.5)
WBC: 4.9 10*3/uL (ref 4.0–10.5)
nRBC: 0.4 % — ABNORMAL HIGH (ref 0.0–0.2)

## 2022-10-13 LAB — RENAL FUNCTION PANEL
Albumin: 2.7 g/dL — ABNORMAL LOW (ref 3.5–5.0)
Anion gap: 13 (ref 5–15)
BUN: 14 mg/dL (ref 8–23)
CO2: 26 mmol/L (ref 22–32)
Calcium: 8.4 mg/dL — ABNORMAL LOW (ref 8.9–10.3)
Chloride: 89 mmol/L — ABNORMAL LOW (ref 98–111)
Creatinine, Ser: 3.54 mg/dL — ABNORMAL HIGH (ref 0.61–1.24)
GFR, Estimated: 18 mL/min — ABNORMAL LOW (ref >60)
Glucose, Bld: 76 mg/dL (ref 70–99)
Phosphorus: 3.1 mg/dL (ref 2.5–4.6)
Potassium: 4.1 mmol/L (ref 3.5–5.1)
Sodium: 128 mmol/L — ABNORMAL LOW (ref 135–145)

## 2022-10-13 LAB — GLUCOSE, CAPILLARY: Glucose-Capillary: 87 mg/dL (ref 70–99)

## 2022-10-13 LAB — MAGNESIUM: Magnesium: 1.9 mg/dL (ref 1.7–2.4)

## 2022-10-13 MED ORDER — MIDODRINE HCL 10 MG PO TABS: 20.0000 mg | ORAL_TABLET | Freq: Three times a day (TID) | ORAL | Status: DC

## 2022-10-13 MED ORDER — RENA-VITE PO TABS: 1.0000 | ORAL_TABLET | Freq: Every day | ORAL | 0 refills | Status: DC

## 2022-10-13 MED ORDER — HEPARIN SODIUM (PORCINE) 1000 UNIT/ML IJ SOLN
INTRAMUSCULAR | Status: AC
  Filled 2022-10-13: qty 4

## 2022-10-13 MED ORDER — POLYETHYLENE GLYCOL 3350 17 G PO PACK: 17.0000 g | PACK | Freq: Every day | ORAL | 0 refills | Status: DC | PRN

## 2022-10-13 MED ORDER — DOCUSATE SODIUM 100 MG PO CAPS: 100.0000 mg | ORAL_CAPSULE | Freq: Two times a day (BID) | ORAL | 0 refills | Status: DC | PRN

## 2022-10-13 MED ORDER — ENSURE ENLIVE PO LIQD: 237.0000 mL | Freq: Two times a day (BID) | ORAL | 12 refills | Status: DC

## 2022-10-13 MED ORDER — VITAMIN B-1 100 MG PO TABS: 100.0000 mg | ORAL_TABLET | Freq: Every day | ORAL | Status: DC

## 2022-10-13 MED ORDER — FOLIC ACID 1 MG PO TABS: 1.0000 mg | ORAL_TABLET | Freq: Every day | ORAL | Status: DC

## 2022-10-13 NOTE — Discharge Summary (Signed)
Physician Discharge Summary  Benjamin [name] TML:[id number] DOB: 1956-05-17 DOA: 10/03/2022  PCP: Benjamin Flow, MD  Admit date: 10/03/2022 Discharge date: 10/13/2022  Admitted From: Home Disposition: [facility]  Recommendations for Outpatient Follow-up:  Follow up with PCP in 1-2 weeks Outpatient follow-up with nephrology  Recommend outpatient referral for sleep study, concern for obstructive sleep apnea  Discharge Condition: Stable CODE STATUS: Full code Diet recommendation: Renal diet  History of present illness:  Benjamin [name] is a 67 y.o. male with past medical history significant for ESRD on HD MWF, chronic systolic congestive heart failure (LVEF 20%), type 2 diabetes mellitus, history of EtOH abuse with cirrhosis, who presented to [facility] ED on 1/28 via EMS from home for altered mental status, slurred speech.  Patient has been having increased productive cough with yellow sputum.  He last received HD on 1/26 but did not finish due to shortness of breath.  He subsequently became increasingly altered and presented to the ED for further evaluation.   Upon arrival to the ED, patient was noted to be altered/confused. WBC 13.5, Hgb 12.1, platelets 66, troponin 59 then 62, ammonia 34, ethanol WNL, NA 127, K3.4, CO2 20, LA 3.4 then 3.6 then 3.2, BNP greater than 4500, COVID/flu/RSV negative, VBG 7.24, 53, 40, 23.  CBG noted to be in the 40s and was given dextrose with some improvement in his mental status.  Patient was notably hypoxic in the 80s on room air and was placed on nasal cannula.  His SBP was low in the 70-80s and hypothermic at 92.8 F.  Sepsis protocol was initiated, cultures obtained and was started on broad-spectrum antibiotics.  Patient was given 1.5 L of IV fluids and albumin.  Patient remained hypotensive despite aggressive resuscitation and was initiated on vasopressors.  CT chest/abdomen/pelvis with mild patchy opacities right middle lobe, cirrhosis with moderate ascites.   Diagnostic paracentesis performed by ED physician.  PCCM consulted and was initially admitted to the intensive care unit.   Significant Hospital events: 1/28: Admitted to ICU for septic shock from CAP, on Levophed drip 1/29: Received hemodialysis, 1 L removed, TTE with LVEF 40%, grade 2 DD, RV function mildly reduced, RV size mildly enlarged, PASP 38.6 mmHg, LAE, RAE 1/30: Titrated off of Levophed drip, work with PT, seen by palliative care. 1/31: Remains off Levophed drip.  Received HD. 2/1: Transferred to hospitalist service. 2/2: Remains confused, recurrent hypoglycemic episodes, poor intake 2/3: Recurrent hypoglycemia, IV fluids increased to 75 mL/h but given ESRD patient changed to D10 gtt at 3m/h; remains confused 2/4: Confusion improved, continues with mild delirium, mild hypoglycemic event overnight, D10 increased to 50 mL/h.  Remains with poor oral intake 2/5: Confusion much improved, continues with poor oral intake, no further hypoglycemic events, D10 decreased to 25 mL/h; HD today 2/6: Alert and oriented today, increased appetite.  Discontinue D10 drip.  Will also discontinue his left IJ central line as long as he has peripheral access.  Pending SNF placement. [facility]  Acute metabolic encephalopathy: Resolved Patient presenting to ED with confusion.  Was notably hypoxic with SpO2 in the 80s on room air secondary to pneumonia.  Also history of EtOH abuse, ammonia level 34 on admission with EtOH level within normal limits.  Also hypotensive likely contributing factor as well as delirium of critical illness/hospital-acquired delirium.   Septic shock, POA Community-acquired pneumonia Patient presenting to ED with confusion, hypoxia with elevated WBC count of 13.5, procalcitonin 3.28.  Lactic acid up to 3.6.  Systolic blood pressure on admission in the 70s-80s and was hypothermic at 92.8 F.  COVID-19/RSV/influenza A/B PCR negative.   CT chest/abdomen/pelvis with mild dependent patchy opacities in the lungs bilaterally favoring mild aspiration.  Completed 6-day course of azithromycin and 5-day course of ceftriaxone.  Levophed was titrated off and supplemental oxygen also was titrated off.   EtOH cirrhosis with ascites Patient underwent paracentesis by EDP on admission, SBP ruled out.  Ammonia level 34 on admission.  EtOH level within normal limits.  Last drink was reported 2 years ago. Continue volume management with hemodialysis. Folic acid, multivitamin, thiamine.  Continue encourage abstinence.   Chronic combined systolic/diastolic congestive heart failure Previous LVEF 20%.  TTE on 1/29 with improved LVEF to 40%, LV with moderately decreased function, LV with global hypokinesis, grade 2 diastolic dysfunction, RVSP 38.6 mmHg, biatrial enlargement, trivial MR, moderate aortic valve stenosis, moderate/severe TR, IVC dilated.  GDMT limited due to patient's hypotension.continue midodrine 20 mg p.o. every 8 hours.    ESRD on HD MWF Metabolic acidosis Nephrology was consulted and followed during hospital course.  Continues on hemodialysis on a Monday/Wednesday/Friday schedule.  Outpatient follow-up with nephrology.   Hypomagnesemia Repleted.     Elevated troponin Troponin elevated 62 on admission with BNP elevated greater than 4500.  Etiology likely secondary to type II demand ischemia in the setting of septic shock and volume overload on admission.   Hx DM2 Hypoglycemia: Resolved Hemoglobin A1c 4.6 on 10/03/2022.  Diet controlled at baseline.  Patient with recurrent hypoglycemic events during hospitalization, likely secondary to poor oral intake.  Cortisol level slightly low at 6.3.  Patient was initially started on a D10 drip which was slowly titrated off once oral intake improved.  No further hypoglycemic events during hospitalization.  Continue intermittent monitoring of glucose.  Encourage increased oral intake    Hallucinations/hospital-acquired delirium: Resolved Patient with active visual/auditory hallucinations in the ICU, improved today; although continues with delirium.  Unclear etiology as his pneumonia has been treated, now oxygenating well on room air.  Continues to have mild hypoglycemic episodes.  Unclear etiology.  History of EtOH abuse.  Unlikely to tolerate MRI brain at this time.  Suspect related to hospital-acquired delirium/delirium of critical illness.  Now resolved.   Weakness/debility/deconditioning/gait disturbance: Followed by PT and OT during hospitalization with recommendation of SNF placement.  Discharging to SNF today.   Suspected sleep apnea Will need outpatient sleep study   Goals of care: Palliative care following.  Recommend continue goals of care discussion outpatient.  Discharge Diagnoses:  Principal Problem:   Septic shock ([place]) Active Problems:   Alcoholic cirrhosis of liver with ascites (HCC)   Acute respiratory failure with hypoxia (HCC)   ESRD on dialysis ([place])   Thrombocytopenia ([place])   Malnutrition of moderate degree    Discharge Instructions  Discharge Instructions     Call MD for:  difficulty breathing, headache or visual disturbances   Complete by: As directed    Call MD for:  extreme fatigue   Complete by: As directed    Call MD for:  persistant dizziness or light-headedness   Complete by: As directed    Call MD for:  persistant nausea and vomiting   Complete by: As directed    Call MD for:  severe uncontrolled pain   Complete by: As directed    Call MD for:  temperature >100.4   Complete by: As directed    Diet - low sodium heart healthy  Complete by: As directed    Discharge wound care:   Complete by: As directed    R arm Clean with NS, apply Xeroform gauze to all open areas daily and wrap with kerlix.   Increase activity slowly   Complete by: As directed       Allergies as of 10/13/2022       Reactions   Grass  Pollen(k-o-r-t-swt Vern) Other (See Comments)   Eyes run, shortness of breath, itching    Penicillins Other (See Comments)   Unknown reaction        Medication List     TAKE these medications    albuterol 108 (90 Base) MCG/ACT inhaler Commonly known as: VENTOLIN HFA Can inhale two puffs every four to six hours as needed for cough, wheeze, shortness of breath, or chest tightness.   docusate sodium 100 MG capsule Commonly known as: COLACE Take 1 capsule (100 mg total) by mouth 2 (two) times daily as needed for mild constipation.   feeding supplement Liqd Take 237 mLs by mouth 2 (two) times daily between meals.   fluticasone 50 MCG/ACT nasal spray Commonly known as: FLONASE Place 2 sprays into both nostrils daily as needed for allergies or rhinitis.   folic acid 1 MG tablet Commonly known as: FOLVITE Take 1 tablet (1 mg total) by mouth daily.   midodrine 10 MG tablet Commonly known as: PROAMATINE Take 2 tablets (20 mg total) by mouth every 8 (eight) hours.   multivitamin Tabs tablet Take 1 tablet by mouth at bedtime.   polyethylene glycol 17 g packet Commonly known as: MIRALAX / GLYCOLAX Take 17 g by mouth daily as needed for moderate constipation.   Symbicort 160-4.5 MCG/ACT inhaler Generic drug: budesonide-formoterol INHALE 2 PUFFS INTO THE LUNGS TWICE DAILY. RINSE, GARGLE, AND SPIT AFTER USE.   thiamine 100 MG tablet Commonly known as: Vitamin B-1 Take 1 tablet (100 mg total) by mouth daily.               Discharge Care Instructions  (From admission, onward)           Start     Ordered   10/13/22 0000  Discharge wound care:       Comments: R arm Clean with NS, apply Xeroform gauze to all open areas daily and wrap with kerlix.   10/13/22 0953            Contact information for follow-up providers     [facility] Follow up.   Specialty: Home Health Services Why: Home health has been arranged.They will contact you to schedule  apt within 48hrs post discharge. Contact information: [address] [place] [address] [phone]         Benjamin Flow, MD. Schedule an appointment as soon as possible for a visit in 1 week(s).   Specialty: Family Medicine Contact information: [place] [address] [phone]              Contact information for after-discharge care     [facility] .   Service: Skilled Nursing Contact information: [address] [address] [phone]                    Allergies  Allergen Reactions   Grass Pollen(K-O-R-T-Swt Vern) Other (See Comments)    Eyes run, shortness of breath, itching    Penicillins  Other (See Comments)    Unknown reaction    Consultations: PCCM -signed off 2/1 Nephrology Palliative care   Procedures/Studies: DG CHEST PORT 1 VIEW  Result Date: 10/08/2022 CLINICAL DATA:  [id number] Acute respiratory failure with hypoxia [facility]) [id number] EXAM: PORTABLE CHEST 1 VIEW COMPARISON:  October 04, 2022 FINDINGS: The cardiomediastinal silhouette is unchanged and enlarged in contour.RIGHT chest double lumen catheter tip terminates over the RIGHT atrium. No significant pleural effusion. No pneumothorax. Similar minimal bibasilar opacities, likely atelectasis. No acute pleuroparenchymal abnormality. Remote RIGHT-sided rib fractures. IMPRESSION: Similar minimal bibasilar opacities, likely atelectasis. Electronically Signed   By: Valentino Saxon M.D.   On: 10/08/2022 07:46   ECHOCARDIOGRAM COMPLETE  Result Date: 10/04/2022    ECHOCARDIOGRAM REPORT   Patient Name:   Benjamin [name] Date of Exam: 10/04/2022 Medical Rec #:  [id number]       Height:       72.0 in Accession #:    [id number]      Weight:       185.0 lb Date of Birth:  Jan 03, 1956       BSA:          2.061 m Patient Age:    67 years        BP:           88/57 mmHg Patient  Gender: M               HR:           75 bpm. Exam Location:  Inpatient Procedure: 2D Echo, Cardiac Doppler, Color Doppler, Strain Analysis and 3D Echo Indications:     S28.31 Acute systolic (congestive) heart failure  History:         Patient has prior history of Echocardiogram examinations, most                  recent 05/07/2022. ESRD; Risk Factors:Hypertension and                  Non-Smoker.  Sonographer:     Wilkie Aye RVT Referring Phys:  [id number] Chrystie Nose PAYNE Diagnosing Phys: Kirk Ruths McleanMD IMPRESSIONS  1. Left ventricular ejection fraction, by estimation, is 40%. The left ventricle has mild to moderately decreased function. The left ventricle demonstrates global hypokinesis. Left ventricular diastolic parameters are consistent with Grade II diastolic dysfunction (pseudonormalization). The average left ventricular global longitudinal strain is -10.6 %. The global longitudinal strain is abnormal.  2. Mildly D-shaped interventricular septum suggestive of RV pressure/volume overload. Right ventricular systolic function is mildly reduced. The right ventricular size is mildly enlarged. There is mildly elevated pulmonary artery systolic pressure. The estimated right ventricular systolic pressure is 73.7 mmHg.  3. Left atrial size was moderately dilated.  4. Right atrial size was moderately dilated.  5. The mitral valve is normal in structure. Trivial mitral valve regurgitation. No evidence of mitral stenosis.  6. The aortic valve is tricuspid. There is moderate calcification of the aortic valve. Aortic valve regurgitation is trivial. Moderate aortic valve stenosis. Aortic valve area, by VTI measures 1.56 cm. Aortic valve mean gradient measures 26.0 mmHg.  7. Tricuspid valve regurgitation is moderate to severe.  8. The inferior vena cava is dilated in size with <50% respiratory variability, suggesting right atrial pressure of 15 mmHg.  9. Ascites noted. FINDINGS  Left Ventricle: Left ventricular ejection fraction,  by estimation, is 40%. The left ventricle has mild to moderately decreased function. The left ventricle demonstrates global hypokinesis. The average left  ventricular global longitudinal strain is -10.6  %. The global longitudinal strain is abnormal. The left ventricular internal cavity size was normal in size. There is no left ventricular hypertrophy. Left ventricular diastolic parameters are consistent with Grade II diastolic dysfunction (pseudonormalization). Right Ventricle: Mildly D-shaped interventricular septum suggestive of RV pressure/volume overload. The right ventricular size is mildly enlarged. No increase in right ventricular wall thickness. Right ventricular systolic function is mildly reduced. There is mildly elevated pulmonary artery systolic pressure. The tricuspid regurgitant velocity is 2.43 m/s, and with an assumed right atrial pressure of 15 mmHg, the estimated right ventricular systolic pressure is 83.1 mmHg. Left Atrium: Left atrial size was moderately dilated. Right Atrium: Right atrial size was moderately dilated. Pericardium: There is no evidence of pericardial effusion. Mitral Valve: The mitral valve is normal in structure. Trivial mitral valve regurgitation. No evidence of mitral valve stenosis. Tricuspid Valve: The tricuspid valve is normal in structure. Tricuspid valve regurgitation is moderate to severe. Aortic Valve: The aortic valve is tricuspid. There is moderate calcification of the aortic valve. Aortic valve regurgitation is trivial. Aortic regurgitation PHT measures 1354 msec. Moderate aortic stenosis is present. Aortic valve mean gradient measures  26.0 mmHg. Aortic valve peak gradient measures 37.9 mmHg. Aortic valve area, by VTI measures 1.56 cm. Pulmonic Valve: The pulmonic valve was normal in structure. Pulmonic valve regurgitation is not visualized. Aorta: The aortic root is normal in size and structure. Venous: The inferior vena cava is dilated in size with less than 50%  respiratory variability, suggesting right atrial pressure of 15 mmHg. IAS/Shunts: No atrial level shunt detected by color [name] Doppler.  LEFT VENTRICLE PLAX 2D LVIDd:         5.10 cm   Diastology LVIDs:         3.50 cm   LV e' medial:    6.41 cm/s LV PW:         1.20 cm   LV E/e' medial:  17.0 LV IVS:        1.10 cm   LV e' lateral:   13.60 cm/s LVOT diam:     2.20 cm   LV E/e' lateral: 8.0 LV SV:         105 LV SV Index:   51        2D Longitudinal Strain LVOT Area:     3.80 cm  2D Strain GLS Avg:     -10.6 %                           3D Volume EF:                          3D EF:        48 %                          LV EDV:       216 ml                          LV ESV:       112 ml                          LV SV:        104 ml RIGHT VENTRICLE  IVC RV Basal diam:  4.60 cm    IVC diam: 2.50 cm RV Mid diam:    4.10 cm RV S prime:     6.86 cm/s TAPSE (M-mode): 2.1 cm LEFT ATRIUM             Index        RIGHT ATRIUM           Index LA diam:        4.80 cm 2.33 cm/m   RA Area:     33.50 cm LA Vol (A2C):   92.1 ml 44.68 ml/m  RA Volume:   130.00 ml 63.07 ml/m LA Vol (A4C):   74.4 ml 36.07 ml/m LA Biplane Vol: 88.2 ml 42.79 ml/m  AORTIC VALVE                     PULMONIC VALVE AV Area (Vmax):    1.54 cm      PV Vmax:       0.89 m/s AV Area (Vmean):   1.52 cm      PV Peak grad:  3.2 mmHg AV Area (VTI):     1.56 cm AV Vmax:           308.00 cm/s AV Vmean:          244.000 cm/s AV VTI:            0.675 m AV Peak Grad:      37.9 mmHg AV Mean Grad:      26.0 mmHg LVOT Vmax:         125.00 cm/s LVOT Vmean:        97.600 cm/s LVOT VTI:          0.277 m LVOT/AV VTI ratio: 0.41 AI PHT:            1354 msec AR Vena Contracta: 0.60 cm  AORTA Ao Root diam: 3.40 cm Ao Asc diam:  3.20 cm MITRAL VALVE                TRICUSPID VALVE MV Area (PHT): 3.43 cm     TR Peak grad:   23.6 mmHg MV Decel Time: 221 msec     TR Vmax:        243.00 cm/s MV E velocity: 109.00 cm/s MV A velocity: 39.00 cm/s   SHUNTS MV E/A ratio:   2.79         Systemic VTI:  0.28 m                             Systemic Diam: 2.20 cm Dalton McleanMD Electronically signed by Franki Monte Signature Date/Time: 10/04/2022/4:13:32 PM    Final (Updated)    DG CHEST PORT 1 VIEW  Result Date: 10/04/2022 CLINICAL DATA:  Central line placement EXAM: PORTABLE CHEST 1 VIEW COMPARISON:  Chest radiograph 10/03/2022 FINDINGS: Dual lumen central venous catheter tip projects over the right atrium, stable. Interval insertion left IJ central venous catheter with tip projecting at the central left brachiocephalic vein. Stable cardiomegaly. Low lung volumes. Basilar heterogeneous opacities. No pleural effusion or pneumothorax. Multiple old right rib fractures. IMPRESSION: 1. Interval insertion left IJ central venous catheter with tip projecting at the central left brachiocephalic vein. 2. Low lung volumes with basilar atelectasis. Electronically Signed   By: Lovey Newcomer M.D.   On: 10/04/2022 12:19   CT CHEST ABDOMEN PELVIS WO CONTRAST  Result Date: 10/03/2022 CLINICAL DATA:  Hypoglycemia, altered mental status, slurred speech EXAM: CT CHEST, ABDOMEN AND PELVIS WITHOUT CONTRAST TECHNIQUE: Multidetector CT imaging of the chest, abdomen and pelvis was performed following the standard protocol without IV contrast. RADIATION DOSE REDUCTION: This exam was performed according to the departmental dose-optimization program which includes automated exposure control, adjustment of the mA and/or kV according to patient size and/or use of iterative reconstruction technique. COMPARISON:  CTA chest dated 09/24/2022. CT abdomen/pelvis dated 04/17/2021. FINDINGS: CT CHEST FINDINGS Cardiovascular: Mild cardiomegaly.  No pericardial effusion. No evidence of thoracic aortic aneurysm. Atherosclerotic calcifications of the aortic root and arch. Severe three-vessel coronary atherosclerosis. Right IJ dialysis catheter terminates in the lower right atrium. Mediastinum/Nodes: No suspicious  mediastinal lymphadenopathy. Visualized thyroid is unremarkable. Lungs/Pleura: Evaluation lung parenchyma is constrained by respiratory motion. Within that constraint, there are no suspicious pulmonary nodules. Mild dependent patchy opacities in the lungs bilaterally, posterior right middle lobe (series 5/image 88) and left lower lobe predominant (series 5/image 111). This appearance favors mild aspiration over atelectasis. No pleural effusion or pneumothorax. Musculoskeletal: Mild degenerative changes of the lower thoracic spine. Old healing and healed right rib fractures. CT ABDOMEN PELVIS FINDINGS Hepatobiliary: Cirrhotic configuration of the liver. Cholelithiasis, without associated inflammatory changes. No intrahepatic or extrahepatic duct dilatation. Pancreas: Within normal limits. Spleen: Within normal limits. Adrenals/Urinary Tract: Adrenal glands are within normal limits. Kidneys are within normal limits. No renal calculi or hydronephrosis. Bladder is decompressed. Stomach/Bowel: Stomach is within normal limits. No evidence of bowel obstruction. Appendix is not discretely visualized. No colonic wall thickening or inflammatory changes. Vascular/Lymphatic: No evidence of abdominal aortic aneurysm. Atherosclerotic calcifications of the abdominal aorta and branch vessels. No suspicious abdominopelvic lymphadenopathy. Reproductive: Prostate is unremarkable. Other: Moderate abdominopelvic ascites, new from 2022 but unchanged in the upper abdomen from recent CT chest. Musculoskeletal: Degenerative changes of the lumbar spine. Old right stone superior and inferior pubic rami fracture deformities. IMPRESSION: Mild dependent patchy opacities in the lungs bilaterally, favoring mild aspiration over atelectasis. Cirrhosis. Moderate abdominopelvic ascites. Cholelithiasis, without associated inflammatory changes. Additional ancillary findings as above. Electronically Signed   By: Julian Hy M.D.   On: 10/03/2022  19:07   DG Chest Portable 1 View  Result Date: 10/03/2022 CLINICAL DATA:  Altered mental status. EXAM: PORTABLE CHEST 1 VIEW COMPARISON:  September 24, 2022. FINDINGS: Stable cardiomegaly. Right internal jugular dialysis catheter is unchanged. Both lungs are clear. The visualized skeletal structures are unremarkable. IMPRESSION: No active disease. Electronically Signed   By: Marijo Conception M.D.   On: 10/03/2022 15:47     Subjective: Patient seen examined bedside, currently in hemodialysis.  No specific complaints this morning.  Alert and oriented.  Discussed with nephrology.  Discharging to SNF today.  No other questions or concerns at this time.  Denies headache, no dizziness, no chest pain, no palpitations, no shortness of breath, no abdominal pain, no focal weakness, no paresthesias.  No acute events overnight per nursing staff.  Discharge Exam: Vitals:   10/13/22 0930 10/13/22 0936  BP: (!) 73/57 (!) 80/57  Pulse: 69 70  Resp: 16 15  Temp:    SpO2:     Vitals:   10/13/22 0832 10/13/22 0900 10/13/22 0930 10/13/22 0936  BP: 96/63 (!) 90/58 (!) 73/57 (!) 80/57  Pulse: 65 66 69 70  Resp: '16 16 16 15  '$ Temp:      TempSrc:      SpO2: 98% 97%    Weight:      Height:  Physical Exam: GEN: NAD, alert and oriented x 3 to place/person/time, chronically ill in appearance, appears older than stated age 19: NCAT, PERRL, EOMI, sclera clear, MMM PULM: CTAB w/o wheezes/crackles, normal respiratory effort, on room air CV: RRR w/o M/G/R, tunneled HD catheter noted right chest GI: abd soft, NTND, NABS, no R/G/M MSK: Trace bilateral lower extremity peripheral edema, muscle strength globally intact 5/5 bilateral upper/lower extremities NEURO: CN II-XII intact, no focal deficits, sensation to light touch intact PSYCH: Depressed mood, flat affect Integumentary: Several areas of ecchymosis/skin tears noted to extremities in various stages of healing, no other concerning rashes/lesions/wounds  noted on exposed skin surfaces.     The results of significant diagnostics from this hospitalization (including imaging, microbiology, ancillary and laboratory) are listed below for reference.     Microbiology: Recent Results (from the past 240 hour(s))  Blood Culture (routine x 2)     Status: None   Collection Time: 10/03/22  3:08 PM   Specimen: BLOOD  Result Value Ref Range Status   Specimen Description BLOOD RIGHT ANTECUBITAL  Final   Special Requests   Final    BOTTLES DRAWN AEROBIC AND ANAEROBIC Blood Culture results may not be optimal due to an inadequate volume of blood received in culture bottles   Culture   Final    NO GROWTH 5 DAYS Performed at [facility], [place] [address] [place], [place] [address]    Report Status 10/08/2022 FINAL  Final  Resp panel by RT-PCR (RSV, Flu A&B, Covid) Anterior Nasal Swab     Status: None   Collection Time: 10/03/22  3:13 PM   Specimen: Anterior Nasal Swab  Result Value Ref Range Status   SARS Coronavirus 2 by RT PCR NEGATIVE NEGATIVE Final    Comment: (NOTE) SARS-CoV-2 target nucleic acids are NOT DETECTED.  The SARS-CoV-2 RNA is generally detectable in upper respiratory specimens during the acute phase of infection. The lowest concentration of SARS-CoV-2 viral copies this assay can detect is 138 copies/mL. A negative result does not preclude SARS-Cov-2 infection and should not be used as the sole basis for treatment or other patient management decisions. A negative result may occur with  improper specimen collection/handling, submission of specimen other than nasopharyngeal swab, presence of viral mutation(s) within the areas targeted by this assay, and inadequate number of viral copies(<138 copies/mL). A negative result must be combined with clinical observations, patient history, and epidemiological information. The expected result is Negative.  Fact Sheet for Patients:  EntrepreneurPulse.com.au  Fact  Sheet for Healthcare Providers:  IncredibleEmployment.be  This test is no t yet approved or cleared by the [place] FDA and  has been authorized for detection and/or diagnosis of SARS-CoV-2 by FDA under an Emergency Use Authorization (EUA). This EUA will remain  in effect (meaning this test can be used) for the duration of the COVID-19 declaration under Section 564(b)(1) of the Act, 21 U.S.C.section 360bbb-3(b)(1), unless the authorization is terminated  or revoked sooner.       Influenza A by PCR NEGATIVE NEGATIVE Final   Influenza B by PCR NEGATIVE NEGATIVE Final    Comment: (NOTE) The Xpert Xpress SARS-CoV-2/FLU/RSV plus assay is intended as an aid in the diagnosis of influenza from Nasopharyngeal swab specimens and should not be used as a sole basis for treatment. Nasal washings and aspirates are unacceptable for Xpert Xpress SARS-CoV-2/FLU/RSV testing.  Fact Sheet for Patients: EntrepreneurPulse.com.au  Fact Sheet for Healthcare Providers: IncredibleEmployment.be  This test is not yet approved or  cleared by the [place] and has been authorized for detection and/or diagnosis of SARS-CoV-2 by FDA under an Emergency Use Authorization (EUA). This EUA will remain in effect (meaning this test can be used) for the duration of the COVID-19 declaration under Section 564(b)(1) of the Act, 21 U.S.C. section 360bbb-3(b)(1), unless the authorization is terminated or revoked.     Resp Syncytial Virus by PCR NEGATIVE NEGATIVE Final    Comment: (NOTE) Fact Sheet for Patients: EntrepreneurPulse.com.au  Fact Sheet for Healthcare Providers: IncredibleEmployment.be  This test is not yet approved or cleared by the [place] FDA and has been authorized for detection and/or diagnosis of SARS-CoV-2 by FDA under an Emergency Use Authorization (EUA). This EUA will remain in effect  (meaning this test can be used) for the duration of the COVID-19 declaration under Section 564(b)(1) of the Act, 21 U.S.C. section 360bbb-3(b)(1), unless the authorization is terminated or revoked.  Performed at [facility], [place] [address], [place], [place] [address]   Blood Culture (routine x 2)     Status: None   Collection Time: 10/03/22  3:13 PM   Specimen: BLOOD RIGHT FOREARM  Result Value Ref Range Status   Specimen Description BLOOD RIGHT FOREARM  Final   Special Requests   Final    BOTTLES DRAWN AEROBIC AND ANAEROBIC Blood Culture results may not be optimal due to an inadequate volume of blood received in culture bottles   Culture   Final    NO GROWTH 5 DAYS Performed at [facility], [place] [address], [place], [place] [address]    Report Status 10/08/2022 FINAL  Final  Body fluid culture w Gram Stain     Status: None   Collection Time: 10/03/22  4:20 PM   Specimen: Peritoneal Cavity; Peritoneal Fluid  Result Value Ref Range Status   Specimen Description PERITONEAL CAVITY  Final   Special Requests NONE  Final   Gram Stain   Final    RARE WBC PRESENT,BOTH PMN AND MONONUCLEAR NO ORGANISMS SEEN    Culture   Final    NO GROWTH 3 DAYS Performed at [facility], [place] [address], [place], [place] [address]    Report Status 10/07/2022 FINAL  Final  MRSA Next Gen by PCR, Nasal     Status: Abnormal   Collection Time: 10/03/22 10:35 PM  Result Value Ref Range Status   MRSA by PCR Next Gen DETECTED (A) NOT DETECTED Final    Comment: RESULT CALLED TO, READ BACK BY AND VERIFIED WITH: LILLY RN 10/04/22 '@0001'$  BY AB (NOTE) The GeneXpert MRSA Assay (FDA approved for NASAL specimens only), is one component of a comprehensive MRSA colonization surveillance program. It is not intended to diagnose MRSA infection nor to guide or monitor treatment for MRSA infections. Test performance is not FDA approved in patients less than 62 years old. Performed at [facility], [place] [address], [place], [place] [address]   Respiratory (~20 pathogens) panel by PCR     Status: None   Collection Time: 10/04/22 11:49 AM   Specimen: Nasopharyngeal Swab; Respiratory  Result Value Ref Range Status   Adenovirus NOT DETECTED NOT DETECTED Final   Coronavirus 229E NOT DETECTED NOT DETECTED Final    Comment: (NOTE) The Coronavirus on the Respiratory Panel, DOES NOT test for the novel  Coronavirus (2019 nCoV)    Coronavirus HKU1 NOT DETECTED NOT DETECTED Final   Coronavirus NL63 NOT DETECTED NOT DETECTED Final  Coronavirus OC43 NOT DETECTED NOT DETECTED Final   Metapneumovirus NOT DETECTED NOT DETECTED Final   Rhinovirus / Enterovirus NOT DETECTED NOT DETECTED Final   Influenza A NOT DETECTED NOT DETECTED Final   Influenza B NOT DETECTED NOT DETECTED Final   Parainfluenza Virus 1 NOT DETECTED NOT DETECTED Final   Parainfluenza Virus 2 NOT DETECTED NOT DETECTED Final   Parainfluenza Virus 3 NOT DETECTED NOT DETECTED Final   Parainfluenza Virus 4 NOT DETECTED NOT DETECTED Final   Respiratory Syncytial Virus NOT DETECTED NOT DETECTED Final   Bordetella pertussis NOT DETECTED NOT DETECTED Final   Bordetella Parapertussis NOT DETECTED NOT DETECTED Final   Chlamydophila pneumoniae NOT DETECTED NOT DETECTED Final   Mycoplasma pneumoniae NOT DETECTED NOT DETECTED Final    Comment: Performed at [facility], [place] [address], [place], [place] [address]     Labs: BNP (last 3 results) Recent Labs    10/03/22 1513  BNP >6,063.0*   Basic Metabolic Panel: Recent Labs  Lab 10/08/22 1209 10/10/22 0446 10/11/22 0500 10/12/22 0550 10/13/22 0828  NA 127* 126* 123* 128* 128*  K 4.1 3.5 3.5 3.8 4.1  CL 92* 92* 89* 91* 89*  CO2 21* '25 25 27 26  '$ GLUCOSE 64* 128* 131* 107* 76  BUN '16 13 16 9 14  '$ CREATININE 3.99* 3.72* 4.13* 3.03* 3.54*  CALCIUM 8.7* 8.0* 8.1* 8.2* 8.4*  MG  --  1.6* 1.9 1.8 1.9  PHOS 2.5  --  3.0 2.8 3.1   Liver Function Tests: Recent  Labs  Lab 10/08/22 1209 10/10/22 0446 10/11/22 0500 10/12/22 0550 10/13/22 0828  AST  --  29  --   --   --   ALT  --  13  --   --   --   ALKPHOS  --  78  --   --   --   BILITOT  --  1.0  --   --   --   PROT  --  5.7*  --   --   --   ALBUMIN 2.7* 2.3* 2.5* 2.7* 2.7*   No results for input(s): "LIPASE", "AMYLASE" in the last 168 hours. No results for input(s): "AMMONIA" in the last 168 hours. CBC: Recent Labs  Lab 10/08/22 0417 10/08/22 1209 10/10/22 0446 10/11/22 0500  WBC 6.4 6.4 5.9 4.9  HGB 10.5* 10.9* 10.4* 10.5*  HCT 31.6* 33.9* 32.3* 32.8*  MCV 89.8 92.4 91.0 93.2  PLT 57* 63* 71* 75*   Cardiac Enzymes: No results for input(s): "CKTOTAL", "CKMB", "CKMBINDEX", "TROPONINI" in the last 168 hours. BNP: Invalid input(s): "POCBNP" CBG: Recent Labs  Lab 10/12/22 0747 10/12/22 1124 10/12/22 1551 10/12/22 1852 10/13/22 0426  GLUCAP 104* 121* 126* 100* 87   D-Dimer No results for input(s): "DDIMER" in the last 72 hours. Hgb A1c No results for input(s): "HGBA1C" in the last 72 hours. Lipid Profile No results for input(s): "CHOL", "HDL", "LDLCALC", "TRIG", "CHOLHDL", "LDLDIRECT" in the last 72 hours. Thyroid function studies No results for input(s): "TSH", "T4TOTAL", "T3FREE", "THYROIDAB" in the last 72 hours.  Invalid input(s): "FREET3" Anemia work up No results for input(s): "VITAMINB12", "FOLATE", "FERRITIN", "TIBC", "IRON", "RETICCTPCT" in the last 72 hours. Urinalysis    Component Value Date/Time   COLORURINE YELLOW 08/11/2021 0638   APPEARANCEUR HAZY (A) 08/11/2021 0638   LABSPEC 1.015 08/11/2021 0638   PHURINE 6.5 08/11/2021 0638   GLUCOSEU NEGATIVE 08/11/2021 0638   HGBUR SMALL (A) 08/11/2021 [place] NEGATIVE 08/11/2021 1601  [place] NEGATIVE 08/11/2021 0638   PROTEINUR 100 (A) 08/11/2021 0638   NITRITE NEGATIVE 08/11/2021 0638   LEUKOCYTESUR SMALL (A) 08/11/2021 0638   Sepsis Labs Recent Labs  Lab 10/08/22 0417 10/08/22 1209  10/10/22 0446 10/11/22 0500  WBC 6.4 6.4 5.9 4.9   Microbiology Recent Results (from the past 240 hour(s))  Blood Culture (routine x 2)     Status: None   Collection Time: 10/03/22  3:08 PM   Specimen: BLOOD  Result Value Ref Range Status   Specimen Description BLOOD RIGHT ANTECUBITAL  Final   Special Requests   Final    BOTTLES DRAWN AEROBIC AND ANAEROBIC Blood Culture results may not be optimal due to an inadequate volume of blood received in culture bottles   Culture   Final    NO GROWTH 5 DAYS Performed at [facility], [place] [address] [place], [place] [address]    Report Status 10/08/2022 FINAL  Final  Resp panel by RT-PCR (RSV, Flu A&B, Covid) Anterior Nasal Swab     Status: None   Collection Time: 10/03/22  3:13 PM   Specimen: Anterior Nasal Swab  Result Value Ref Range Status   SARS Coronavirus 2 by RT PCR NEGATIVE NEGATIVE Final    Comment: (NOTE) SARS-CoV-2 target nucleic acids are NOT DETECTED.  The SARS-CoV-2 RNA is generally detectable in upper respiratory specimens during the acute phase of infection. The lowest concentration of SARS-CoV-2 viral copies this assay can detect is 138 copies/mL. A negative result does not preclude SARS-Cov-2 infection and should not be used as the sole basis for treatment or other patient management decisions. A negative result may occur with  improper specimen collection/handling, submission of specimen other than nasopharyngeal swab, presence of viral mutation(s) within the areas targeted by this assay, and inadequate number of viral copies(<138 copies/mL). A negative result must be combined with clinical observations, patient history, and epidemiological information. The expected result is Negative.  Fact Sheet for Patients:  EntrepreneurPulse.com.au  Fact Sheet for Healthcare Providers:  IncredibleEmployment.be  This test is no t yet approved or cleared by the [place] FDA and   has been authorized for detection and/or diagnosis of SARS-CoV-2 by FDA under an Emergency Use Authorization (EUA). This EUA will remain  in effect (meaning this test can be used) for the duration of the COVID-19 declaration under Section 564(b)(1) of the Act, 21 U.S.C.section 360bbb-3(b)(1), unless the authorization is terminated  or revoked sooner.       Influenza A by PCR NEGATIVE NEGATIVE Final   Influenza B by PCR NEGATIVE NEGATIVE Final    Comment: (NOTE) The Xpert Xpress SARS-CoV-2/FLU/RSV plus assay is intended as an aid in the diagnosis of influenza from Nasopharyngeal swab specimens and should not be used as a sole basis for treatment. Nasal washings and aspirates are unacceptable for Xpert Xpress SARS-CoV-2/FLU/RSV testing.  Fact Sheet for Patients: EntrepreneurPulse.com.au  Fact Sheet for Healthcare Providers: IncredibleEmployment.be  This test is not yet approved or cleared by the [place] FDA and has been authorized for detection and/or diagnosis of SARS-CoV-2 by FDA under an Emergency Use Authorization (EUA). This EUA will remain in effect (meaning this test can be used) for the duration of the COVID-19 declaration under Section 564(b)(1) of the Act, 21 U.S.C. section 360bbb-3(b)(1), unless the authorization is terminated or revoked.     Resp Syncytial Virus by PCR NEGATIVE NEGATIVE Final    Comment: (NOTE) Fact Sheet for Patients: EntrepreneurPulse.com.au  Fact Sheet for Healthcare Providers:  IncredibleEmployment.be  This test is not yet approved or cleared by the [place] and has been authorized for detection and/or diagnosis of SARS-CoV-2 by FDA under an Emergency Use Authorization (EUA). This EUA will remain in effect (meaning this test can be used) for the duration of the COVID-19 declaration under Section 564(b)(1) of the Act, 21 U.S.C. section 360bbb-3(b)(1),  unless the authorization is terminated or revoked.  Performed at [facility], [place] [address], [place], [place] [address]   Blood Culture (routine x 2)     Status: None   Collection Time: 10/03/22  3:13 PM   Specimen: BLOOD RIGHT FOREARM  Result Value Ref Range Status   Specimen Description BLOOD RIGHT FOREARM  Final   Special Requests   Final    BOTTLES DRAWN AEROBIC AND ANAEROBIC Blood Culture results may not be optimal due to an inadequate volume of blood received in culture bottles   Culture   Final    NO GROWTH 5 DAYS Performed at [facility], [place] [address] [place], [place] [address]    Report Status 10/08/2022 FINAL  Final  Body fluid culture w Gram Stain     Status: None   Collection Time: 10/03/22  4:20 PM   Specimen: Peritoneal Cavity; Peritoneal Fluid  Result Value Ref Range Status   Specimen Description PERITONEAL CAVITY  Final   Special Requests NONE  Final   Gram Stain   Final    RARE WBC PRESENT,BOTH PMN AND MONONUCLEAR NO ORGANISMS SEEN    Culture   Final    NO GROWTH 3 DAYS Performed at [facility], [place] [address], [place], [place] [address]    Report Status 10/07/2022 FINAL  Final  MRSA Next Gen by PCR, Nasal     Status: Abnormal   Collection Time: 10/03/22 10:35 PM  Result Value Ref Range Status   MRSA by PCR Next Gen DETECTED (A) NOT DETECTED Final    Comment: RESULT CALLED TO, READ BACK BY AND VERIFIED WITH: LILLY RN 10/04/22 '@0001'$  BY AB (NOTE) The GeneXpert MRSA Assay (FDA approved for NASAL specimens only), is one component of a comprehensive MRSA colonization surveillance program. It is not intended to diagnose MRSA infection nor to guide or monitor treatment for MRSA infections. Test performance is not FDA approved in patients less than 66 years old. Performed at [facility], [place] [address] [place], [place] [address]   Respiratory (~20 pathogens) panel by PCR     Status: None   Collection Time: 10/04/22 11:49 AM    Specimen: Nasopharyngeal Swab; Respiratory  Result Value Ref Range Status   Adenovirus NOT DETECTED NOT DETECTED Final   Coronavirus 229E NOT DETECTED NOT DETECTED Final    Comment: (NOTE) The Coronavirus on the Respiratory Panel, DOES NOT test for the novel  Coronavirus (2019 nCoV)    Coronavirus HKU1 NOT DETECTED NOT DETECTED Final   Coronavirus NL63 NOT DETECTED NOT DETECTED Final   Coronavirus OC43 NOT DETECTED NOT DETECTED Final   Metapneumovirus NOT DETECTED NOT DETECTED Final   Rhinovirus / Enterovirus NOT DETECTED NOT DETECTED Final   Influenza A NOT DETECTED NOT DETECTED Final   Influenza B NOT DETECTED NOT DETECTED Final   Parainfluenza Virus 1 NOT DETECTED NOT DETECTED Final   Parainfluenza Virus 2 NOT DETECTED NOT DETECTED Final   Parainfluenza Virus 3 NOT DETECTED NOT DETECTED Final   Parainfluenza Virus 4 NOT DETECTED NOT DETECTED Final   Respiratory Syncytial Virus  NOT DETECTED NOT DETECTED Final   Bordetella pertussis NOT DETECTED NOT DETECTED Final   Bordetella Parapertussis NOT DETECTED NOT DETECTED Final   Chlamydophila pneumoniae NOT DETECTED NOT DETECTED Final   Mycoplasma pneumoniae NOT DETECTED NOT DETECTED Final    Comment: Performed at [facility], [place] [address], [place], [place] [address]     Time coordinating discharge: Over 30 minutes  SIGNED:   Stephanie Littman J [place] DO  [facility] 10/13/2022, 9:53 AM

## 2022-10-13 NOTE — Progress Notes (Signed)
Patients blood pressure dropped to 73/57, patient stably not symptomatic. UF cut off, MD Schertz made aware.

## 2022-10-13 NOTE — TOC Transition Note (Signed)
Transition of Care [facility]) - CM/SW Discharge Note   Patient Details  Name: Benjamin [name] MRN: [id number] Date of Birth: 11/30/1955  [facility]) CM/SW Contact:  Curlene Labrum, RN Phone Number: 10/13/2022, 11:29 AM   Clinical Narrative:    CM called and spoke with Attending physician, Dr. Beverely Risen, this morning at 67 am and the patient is medically stable to discharge to the facility - [facility] today once he returns from Inpatient hemodialysis.  Discharge summary was uploaded in the hub and I spoke with [name], Admission at [facility] and the facility can accept the patient today.    PTAR will be set up for transport after the patient returns to the floor.  I called the patient's son, Lovena Le and he is aware.  Bedside nursing - please call report to [facility] at [phone] for Room number 113.     Final next level of care: Skilled Nursing Facility Barriers to Discharge: Continued Medical Work up   Patient Goals and CMS Choice CMS Medicare.gov Compare Post Acute Care list provided to:: Patient Represenative (must comment) (Patient's son Herby Amick) Choice offered to / list presented to : Adult Children  Discharge Placement                         Discharge Plan and Services Additional resources added to the After Visit Summary for     Discharge Planning Services: CM Consult Post Acute Care Choice: Skilled Nursing Facility                    HH Arranged: PT, OT, Nurse's Aide [facility] Agency: [facility]        Social Determinants of Health (SDOH) Interventions SDOH Screenings   Tobacco Use: Medium Risk (10/03/2022)     Readmission Risk Interventions     No data to display

## 2022-10-13 NOTE — Progress Notes (Signed)
PT Cancellation Note  Patient Details Name: TAYSEN BUSHART MRN: [id number] DOB: [birth year]   Cancelled Treatment:    Reason Eval/Treat Not Completed: Patient at procedure or test/unavailable Attempted to see pt for PT tx but pt noted to be OTF at dialysis. Will f/u as able.  Lavone Nian, PT, DPT 10/13/22, 10:23 AM   Waunita Schooner 10/13/2022, 10:23 AM

## 2022-10-13 NOTE — Progress Notes (Signed)
PT Blood Pressure dropped, UF turned off on pt, retook bp, RN aware

## 2022-10-13 NOTE — Progress Notes (Signed)
[facility] NEPHROLOGY PROGRESS NOTE  Subjective: Seen in dialysis, no c/o's today. May be dc'd to facility.   Objective Vitals:   10/11/22 1200 10/11/22 1221 10/11/22 1230 10/11/22 1235  BP: (!) '82/62 99/62 95/61 '$ (!) 87/57  Pulse: (!) 58 (!) 58 (!) 58 (!) 59  Resp: '14 11 11 12  '$ Temp:   98.7 F (37.1 C)   TempSrc:      SpO2:   96% 97%  Weight:   93.2 kg   Height:       Physical Exam: General: Alert awake, lying in bed comfortable. Heart:RRR, s1s2 nl Lungs: Clear bilateral.  No wheeze. Abdomen:soft, Non-tender, non-distended, diffuse 2+ ascites Extremities: No edema. Dialysis Access: RIJ TDC in place.   OP HD: [facility] MWF 4h  400/600  86.9kg  3K/2.5Ca bath  TDC  RIJ + LUA AVF  Heparin none - last HD 1/26, post wt 88.3kg   - soft BP's at HD in 90s for last 2 wks at minimum - venofer '50mg'$  weekly - mircera 30 mcg q4 wks, last on 1/15, due 2/12    Assessment/ Plan: # Septic shock w/ acute pneumonia - completed ceftriaxone and azithromycin.  Off of pressors and out of ICU. Currently on midodrine 15 tid. BP's remain soft.   # Volume - remains 6 kg up, getting 2 L max UF due to low BP's  # ESRD - on HD MWF. HD tomorrow.    # Acute hypoxic respiratory failure - due to pneumonia, improved on RA  # Anemia of CKD: Hemoglobin at goal.  Benjamin [name] as outpatient.  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    # Hyponatremia - 123 up to 128 yesterday and today. Stable.   # Acute metabolic encephalopathy - Ox 3 now  # Dispo - possible for dc to SNF today    Kelly Splinter, MD 10/13/2022, 9:54 AM  Recent Labs  Lab 10/10/22 0446 10/11/22 0500 10/12/22 0550 10/13/22 0828  HGB 10.4* 10.5*  --   --   ALBUMIN 2.3* 2.5* 2.7* 2.7*  CALCIUM 8.0* 8.1* 8.2* 8.4*  PHOS  --  3.0 2.8 3.1  CREATININE 3.72* 4.13* 3.03* 3.54*  K 3.5 3.5 3.8 4.1     Inpatient medications:  arformoterol  15 mcg Nebulization BID   budesonide (PULMICORT) nebulizer  solution  0.5 mg Nebulization BID   Chlorhexidine Gluconate Cloth  6 each Topical Q0600   feeding supplement  237 mL Oral BID BM   folic acid  1 mg Oral Daily   heparin  5,000 Units Subcutaneous Q8H   midodrine  20 mg Oral Q8H   multivitamin  1 tablet Oral QHS   mouth rinse  15 mL Mouth Rinse 4 times per day   QUEtiapine  25 mg Oral QHS   sodium chloride flush  10-40 mL Intracatheter Q12H   thiamine  100 mg Oral Daily    sodium chloride Stopped (10/08/22 2213)   acetaminophen, docusate sodium, haloperidol lactate, hydrOXYzine, ipratropium-albuterol, polyethylene glycol

## 2022-10-13 NOTE — Progress Notes (Signed)
   10/13/22 1147  Vitals  Temp 98.9 F (37.2 C)  Pulse Rate 73  Resp 16  BP 98/65  SpO2 99 %  O2 Device Nasal Cannula  Weight 96.1 kg  Type of Weight Actual  Oxygen Therapy  Patient Activity (if Appropriate) In bed  Pulse Oximetry Type Continuous  Post Treatment  Duration of HD Treatment -hour(s) (S)  3 hour(s) (3.0 hours per med order)  Hemodialysis Intake (mL) 0 mL  Liters Processed 72  Fluid Removed (mL) 400 mL   Received patient in bed to unit.  Alert and oriented.  Informed consent signed and in chart.   TX duration:3.0 hours per md order  Patient tolerated well.  Transported back to the room  Alert, without acute distress.  Hand-off given to patient's nurse.   Access used: [facility] Access issues: no complications  Total UF removed: 400 Medication(s) given: none   [facility]

## 2022-10-13 NOTE — Consult Note (Signed)
   [facility] CM Inpatient Consult   10/13/2022  RODRIC PUNCH 09/30/1955 [id number]  [place] [facility] [ACO] Patient: Medicare ACO REACH  Primary Care Provider:  Mateo Flow, MD   Patient was reviewed for length of stay for barriers to care. Patient for SNF rehab  If the patient goes to a [facility] affiliated facility then, patient can be followed by [facility] PAC RN with traditional Medicare and approved Medicare Advantage plans.    Plan:  No follow up at post  hospital facility for care coordination needs as patient went to [facility] a non-[facility] affiliated facility  For questions or referrals, please contact:   Natividad Brood, RN BSN [place]  [phone] business mobile phone Toll free office [phone]  *[place]  [phone] Fax number: [phone] [email or web address]

## 2022-10-13 NOTE — Progress Notes (Signed)
Pt to d/c to snf today. Contacted [facility] to make clinic aware of pt's d/c today and that pt should resume care on Friday. Pt's schedule is MWF with 11:45 am chair time. HD info added to pt's AVS as well.   Melven Sartorius Renal Navigator [phone]

## 2022-10-15 LAB — VITAMIN B1: Vitamin B1 (Thiamine): 145.8 nmol/L (ref 66.5–200.0)

## 2022-11-09 ENCOUNTER — Other Ambulatory Visit: Payer: Self-pay

## 2022-11-09 ENCOUNTER — Encounter (HOSPITAL_COMMUNITY): Payer: Self-pay

## 2022-11-09 DIAGNOSIS — Z992 Dependence on renal dialysis: Secondary | ICD-10-CM

## 2022-11-11 ENCOUNTER — Ambulatory Visit: Payer: Self-pay

## 2022-11-11 ENCOUNTER — Ambulatory Visit (HOSPITAL_COMMUNITY): Payer: Medicare Other | Attending: Vascular Surgery

## 2022-11-11 DIAGNOSIS — Z7409 Other reduced mobility: Secondary | ICD-10-CM | POA: Insufficient documentation

## 2022-11-18 ENCOUNTER — Encounter (HOSPITAL_COMMUNITY): Payer: Medicare Other

## 2022-11-19 ENCOUNTER — Ambulatory Visit: Payer: Self-pay

## 2022-11-23 ENCOUNTER — Ambulatory Visit (HOSPITAL_COMMUNITY)
Admission: RE | Admit: 2022-11-23 | Discharge: 2022-11-23 | Disposition: A | Discharge status: Home / Self Care | Payer: No Typology Code available for payment source | Source: Ambulatory Visit | Attending: Vascular Surgery | Admitting: Vascular Surgery

## 2022-11-23 DIAGNOSIS — N186 End stage renal disease: Secondary | ICD-10-CM

## 2022-11-23 DIAGNOSIS — Z992 Dependence on renal dialysis: Secondary | ICD-10-CM | POA: Diagnosis not present

## 2022-11-25 ENCOUNTER — Ambulatory Visit: Payer: Self-pay

## 2022-11-26 ENCOUNTER — Ambulatory Visit: Payer: Self-pay

## 2022-11-29 ENCOUNTER — Other Ambulatory Visit (HOSPITAL_COMMUNITY): Payer: Self-pay | Admitting: Internal Medicine

## 2022-11-29 DIAGNOSIS — E1122 Type 2 diabetes mellitus with diabetic chronic kidney disease: Secondary | ICD-10-CM

## 2022-11-29 DIAGNOSIS — N186 End stage renal disease: Secondary | ICD-10-CM

## 2022-12-01 NOTE — Progress Notes (Deleted)
HISTORY AND PHYSICAL     CC:  dialysis access Requesting Provider:  Justin Mend, MD  HPI: This is a 67 y.o. male here for evaluation of his hemodialysis access.  Pt has hx of left BC AVF 08/11/2021 by Dr. Virl Cagey.  He presents today with ***   The pt is *** hand dominant.    Pt is on dialysis.   Days of dialysis if applicable:  ***    HD center:  *** St location.   The pt is not on a statin for cholesterol management.  The pt is not on a daily aspirin.  Other AC:  none The pt is not on medication for hypertension.  The pt is not on medication for diabetes.   Tobacco hx:  never  Past Medical History:  Diagnosis Date   AKI (acute kidney injury) ([place]) 02/2020   stage 4   CHF (congestive heart failure) ([place])    Diabetes mellitus without complication ([place])    diet controlled   Dyspnea    Hypertension     Past Surgical History:  Procedure Laterality Date   A/V FISTULAGRAM Left 03/31/2022   Procedure: A/V Fistulagram;  Surgeon: [name], MD;  Location: [facility];  Service: Cardiovascular;  Laterality: Left;   AV FISTULA PLACEMENT Left 08/11/2021   Procedure: LEFT ARM BRACHIOCEPHALIC ARTERIOVENOUS (AV) FISTULA CREATION;  Surgeon: [name], MD;  Location: [facility];  Service: Vascular;  Laterality: Left;   IR PARACENTESIS  02/23/2022   IR PARACENTESIS  03/18/2022   IR PARACENTESIS  04/13/2022   IR PARACENTESIS  05/13/2022   WISDOM TOOTH EXTRACTION      Allergies  Allergen Reactions   Grass Pollen(K-O-R-T-Swt Vern) Other (See Comments)    Eyes run, shortness of breath, itching    Penicillins Other (See Comments)    Unknown reaction    Current Outpatient Medications  Medication Sig Dispense Refill   albuterol (VENTOLIN HFA) 108 (90 Base) MCG/ACT inhaler Can inhale two puffs every four to six hours as needed for cough, wheeze, shortness of breath, or chest tightness. 8.5 g 1   docusate sodium (COLACE) 100 MG capsule Take 1 capsule (100 mg total) by  mouth 2 (two) times daily as needed for mild constipation. 10 capsule 0   feeding supplement (ENSURE ENLIVE / ENSURE PLUS) LIQD Take 237 mLs by mouth 2 (two) times daily between meals. 237 mL 12   fluticasone (FLONASE) 50 MCG/ACT nasal spray Place 2 sprays into both nostrils daily as needed for allergies or rhinitis.     folic acid (FOLVITE) 1 MG tablet Take 1 tablet (1 mg total) by mouth daily.     midodrine (PROAMATINE) 10 MG tablet Take 2 tablets (20 mg total) by mouth every 8 (eight) hours.     multivitamin (RENA-VIT) TABS tablet Take 1 tablet by mouth at bedtime.  0   polyethylene glycol (MIRALAX / GLYCOLAX) 17 g packet Take 17 g by mouth daily as needed for moderate constipation. 14 each 0   SYMBICORT 160-4.5 MCG/ACT inhaler INHALE 2 PUFFS INTO THE LUNGS TWICE DAILY. RINSE, GARGLE, AND SPIT AFTER USE. (Patient not taking: Reported on 10/04/2022) 10.2 g 5   thiamine (VITAMIN B-1) 100 MG tablet Take 1 tablet (100 mg total) by mouth daily.     No current facility-administered medications for this visit.    Family History  Problem Relation Age of Onset   Stroke Mother    Stroke Father     Social  History   Socioeconomic History   Marital status: Married    Spouse name: Not on file   Number of children: Not on file   Years of education: Not on file   Highest education level: Not on file  Occupational History   Not on file  Tobacco Use   Smoking status: Never   Smokeless tobacco: Former    Types: Sarina Ser    Quit date: 2021  Vaping Use   Vaping Use: Never used  Substance and Sexual Activity   Alcohol use: Not Currently   Drug use: Never   Sexual activity: Not on file  Other Topics Concern   Not on file  Social History Narrative   Not on file   Social Determinants of Health   Financial Resource Strain: Not on file  Food Insecurity: Not on file  Transportation Needs: Not on file  Physical Activity: Not on file  Stress: Not on file  Social Connections: Not on file   Intimate Partner Violence: Not on file     ROS: [x]  Positive   [ ]  Negative   [ ]  All sytems reviewed and are negative *** Cardiac: []  chest pain/pressure []  SOB/DOE  Vascular: []  pain in legs while walking []  pain in feet when lying flat []  swelling in legs  Pulmonary: []  asthma []  wheezing  Neurologic: []  hx CVA/TIA  Hematologic: []  bleeding problems  GI []  GERD  GU: [x]  CKD/renal failure  []  HD---[]  M/W/F []  T/T/S  Psychiatric: []  hx of major depression  Integumentary: []  rashes []  ulcers  Constitutional: []  fever []  chills   PHYSICAL EXAMINATION:  ***   General:  WDWN male in NAD Gait: Not observed HENT: WNL Pulmonary: normal non-labored breathing  Cardiac: {Desc; regular/irreg:14544}, {With/Without:20273} carotid bruit*** Abdomen: soft, NT Skin: {With/Without:20273} rashes Vascular Exam/Pulses:   Right Left  Radial {Exam; arterial pulse strength 0-4:30167} {Exam; arterial pulse strength 0-4:30167}  Ulnar {Exam; arterial pulse strength 0-4:30167} {Exam; arterial pulse strength 0-4:30167}   Extremities:  *** Musculoskeletal: no muscle wasting or atrophy  Neurologic: A&O X 3  Non-Invasive Vascular Imaging:   Upper Extremity Vein Mapping on ***: ***   ASSESSMENT/PLAN: 67 y.o. male with ESRD here for evaluation of his hemodialysis access with hx of  left BC AVF 08/11/2021 by Dr. Virl Cagey.   -*** -pt *** on dialysis on *** -discussed with pt that access does not last forever and will need intervention or even new access at some point.  -pt is *** hand dominant - will plan for *** -pt *** on anticoagulation   Leontine Locket, [facility] [phone]  Clinic MD:   Scot Dock

## 2022-12-02 ENCOUNTER — Ambulatory Visit: Payer: Self-pay

## 2022-12-02 ENCOUNTER — Ambulatory Visit (HOSPITAL_COMMUNITY)
Admission: RE | Admit: 2022-12-02 | Discharge: 2022-12-02 | Disposition: A | Discharge status: Home / Self Care | Payer: Medicare Other | Source: Ambulatory Visit | Attending: Internal Medicine | Admitting: Internal Medicine

## 2022-12-02 DIAGNOSIS — R188 Other ascites: Secondary | ICD-10-CM | POA: Insufficient documentation

## 2022-12-02 DIAGNOSIS — E1122 Type 2 diabetes mellitus with diabetic chronic kidney disease: Secondary | ICD-10-CM | POA: Insufficient documentation

## 2022-12-02 DIAGNOSIS — N186 End stage renal disease: Secondary | ICD-10-CM

## 2022-12-02 DIAGNOSIS — Z992 Dependence on renal dialysis: Secondary | ICD-10-CM | POA: Insufficient documentation

## 2022-12-02 HISTORY — PX: IR PARACENTESIS: IMG2679

## 2022-12-02 MED ORDER — LIDOCAINE HCL 1 % IJ SOLN
INTRAMUSCULAR | Status: AC
  Administered 2022-12-02: 10 mL
  Filled 2022-12-02: qty 20

## 2022-12-03 ENCOUNTER — Other Ambulatory Visit (HOSPITAL_COMMUNITY): Payer: Medicare Other

## 2022-12-06 DEATH — deceased

## 2022-12-10 ENCOUNTER — Ambulatory Visit: Payer: Self-pay
